# Patient Record
Sex: Female | Born: 1960 | Race: White | Hispanic: No | Smoking: Current some day smoker
Health system: Southern US, Community
[De-identification: ages and names within clinical notes are randomized; demographics above are authoritative.]

## PROBLEM LIST (undated history)

## (undated) DIAGNOSIS — R0602 Shortness of breath: Secondary | ICD-10-CM

## (undated) DIAGNOSIS — E119 Type 2 diabetes mellitus without complications: Secondary | ICD-10-CM

## (undated) DIAGNOSIS — Z720 Tobacco use: Secondary | ICD-10-CM

## (undated) DIAGNOSIS — E039 Hypothyroidism, unspecified: Secondary | ICD-10-CM

## (undated) DIAGNOSIS — L304 Erythema intertrigo: Secondary | ICD-10-CM

## (undated) DIAGNOSIS — Z87442 Personal history of urinary calculi: Secondary | ICD-10-CM

## (undated) DIAGNOSIS — Z91018 Allergy to other foods: Secondary | ICD-10-CM

## (undated) DIAGNOSIS — E1165 Type 2 diabetes mellitus with hyperglycemia: Secondary | ICD-10-CM

## (undated) DIAGNOSIS — L0291 Cutaneous abscess, unspecified: Secondary | ICD-10-CM

## (undated) DIAGNOSIS — I2510 Atherosclerotic heart disease of native coronary artery without angina pectoris: Secondary | ICD-10-CM

## (undated) DIAGNOSIS — K469 Unspecified abdominal hernia without obstruction or gangrene: Secondary | ICD-10-CM

## (undated) DIAGNOSIS — L0390 Cellulitis, unspecified: Secondary | ICD-10-CM

## (undated) DIAGNOSIS — I4510 Unspecified right bundle-branch block: Secondary | ICD-10-CM

## (undated) DIAGNOSIS — F419 Anxiety disorder, unspecified: Secondary | ICD-10-CM

## (undated) DIAGNOSIS — I10 Essential (primary) hypertension: Secondary | ICD-10-CM

## (undated) HISTORY — DX: Type 2 diabetes mellitus without complications: E11.9

## (undated) HISTORY — DX: Allergy to other foods: Z91.018

## (undated) HISTORY — DX: Unspecified abdominal hernia without obstruction or gangrene: K46.9

## (undated) HISTORY — DX: Shortness of breath: R06.02

---

## 2015-03-24 ENCOUNTER — Emergency Department (HOSPITAL_COMMUNITY)
Admission: EM | Admit: 2015-03-24 | Discharge: 2015-03-24 | Disposition: A | Discharge status: Home / Self Care | Payer: Self-pay | Attending: Emergency Medicine | Admitting: Emergency Medicine

## 2015-03-24 ENCOUNTER — Encounter (HOSPITAL_COMMUNITY): Payer: Self-pay | Admitting: Emergency Medicine

## 2015-03-24 DIAGNOSIS — Z720 Tobacco use: Secondary | ICD-10-CM | POA: Insufficient documentation

## 2015-03-24 DIAGNOSIS — K029 Dental caries, unspecified: Secondary | ICD-10-CM | POA: Insufficient documentation

## 2015-03-24 DIAGNOSIS — K047 Periapical abscess without sinus: Secondary | ICD-10-CM | POA: Insufficient documentation

## 2015-03-24 MED ORDER — PENICILLIN V POTASSIUM 250 MG PO TABS
500.0000 mg | ORAL_TABLET | Freq: Once | ORAL | Status: AC
  Administered 2015-03-24: 500 mg via ORAL
  Filled 2015-03-24: qty 2

## 2015-03-24 MED ORDER — TRAMADOL HCL 50 MG PO TABS
50.0000 mg | ORAL_TABLET | Freq: Once | ORAL | Status: DC
  Filled 2015-03-24: qty 1

## 2015-03-24 MED ORDER — PENICILLIN V POTASSIUM 500 MG PO TABS: 500.0000 mg | ORAL_TABLET | Freq: Four times a day (QID) | ORAL | Status: DC

## 2015-03-24 MED ORDER — TRAMADOL HCL 50 MG PO TABS: 50.0000 mg | ORAL_TABLET | Freq: Four times a day (QID) | ORAL | Status: DC | PRN

## 2015-03-24 NOTE — ED Notes (Signed)
Patient states started having dental pain in R lower mouth a few days ago.  Patient states no tooth in the area where she is hurting.  Patient denies drainage.

## 2015-03-24 NOTE — Discharge Instructions (Signed)
Dental Care and Dentist Visits Dental care supports good overall health. Regular dental visits can also help you avoid dental pain, bleeding, infection, and other more serious health problems in the future. It is important to keep the mouth healthy because diseases in the teeth, gums, and other oral tissues can spread to other areas of the body. Some problems, such as diabetes, heart disease, and pre-term labor have been associated with poor oral health.  See your dentist every 6 months. If you experience emergency problems such as a toothache or broken tooth, go to the dentist right away. If you see your dentist regularly, you may catch problems early. It is easier to be treated for problems in the early stages.  WHAT TO EXPECT AT A DENTIST VISIT  Your dentist will look for many common oral health problems and recommend proper treatment. At your regular dental visit, you can expect:  Gentle cleaning of the teeth and gums. This includes scraping and polishing. This helps to remove the sticky substance around the teeth and gums (plaque). Plaque forms in the mouth shortly after eating. Over time, plaque hardens on the teeth as tartar. If tartar is not removed regularly, it can cause problems. Cleaning also helps remove stains.  Periodic X-rays. These pictures of the teeth and supporting bone will help your dentist assess the health of your teeth.  Periodic fluoride treatments. Fluoride is a natural mineral shown to help strengthen teeth. Fluoride treatmentinvolves applying a fluoride gel or varnish to the teeth. It is most commonly done in children.  Examination of the mouth, tongue, jaws, teeth, and gums to look for any oral health problems, such as:  Cavities (dental caries). This is decay on the tooth caused by plaque, sugar, and acid in the mouth. It is best to catch a cavity when it is small.  Inflammation of the gums caused by plaque buildup (gingivitis).  Problems with the mouth or malformed  or misaligned teeth.  Oral cancer or other diseases of the soft tissues or jaws. KEEP YOUR TEETH AND GUMS HEALTHY For healthy teeth and gums, follow these general guidelines as well as your dentist's specific advice:  Have your teeth professionally cleaned at the dentist every 6 months.  Brush twice daily with a fluoride toothpaste.  Floss your teeth daily.  Ask your dentist if you need fluoride supplements, treatments, or fluoride toothpaste.  Eat a healthy diet. Reduce foods and drinks with added sugar.  Avoid smoking. TREATMENT FOR ORAL HEALTH PROBLEMS If you have oral health problems, treatment varies depending on the conditions present in your teeth and gums.  Your caregiver will most likely recommend good oral hygiene at each visit.  For cavities, gingivitis, or other oral health disease, your caregiver will perform a procedure to treat the problem. This is typically done at a separate appointment. Sometimes your caregiver will refer you to another dental specialist for specific tooth problems or for surgery. SEEK IMMEDIATE DENTAL CARE IF:  You have pain, bleeding, or soreness in the gum, tooth, jaw, or mouth area.  A permanent tooth becomes loose or separated from the gum socket.  You experience a blow or injury to the mouth or jaw area. Document Released: 08/29/2011 Document Revised: 03/10/2012 Document Reviewed: 08/29/2011 [facility] Patient Information 2015 [place], [place]. This information is not intended to replace advice given to you by your health care provider. Make sure you discuss any questions you have with your health care provider.  Abscessed Tooth An abscessed tooth is an infection  around your tooth. It may be caused by holes or damage to the tooth (cavity) or a dental disease. An abscessed tooth causes mild to very bad pain in and around the tooth. See your dentist right away if you have tooth or gum pain. HOME CARE  Take your medicine as told. Finish it  even if you start to feel better.  Do not drive after taking pain medicine.  Rinse your mouth (gargle) often with salt water ( teaspoon salt in 8 ounces of warm water).  Do not apply heat to the outside of your face. GET HELP RIGHT AWAY IF:   You have a temperature by mouth above 102 F (38.9 C), not controlled by medicine.  You have chills and a very bad headache.  You have problems breathing or swallowing.  Your mouth will not open.  You develop puffiness (swelling) on the neck or around the eye.  Your pain is not helped by medicine.  Your pain is getting worse instead of better. MAKE SURE YOU:   Understand these instructions.  Will watch your condition.  Will get help right away if you are not doing well or get worse. Document Released: 06/04/2008 Document Revised: 03/10/2012 Document Reviewed: 03/27/2011 [facility] Patient Information 2015 [place], [place]. This information is not intended to replace advice given to you by your health care provider. Make sure you discuss any questions you have with your health care provider.  Emergency Department Resource Guide 1) Find a Doctor and Pay Out of Pocket Although you won't have to find out who is covered by your insurance plan, it is a good idea to ask around and get recommendations. You will then need to call the office and see if the doctor you have chosen will accept you as a new patient and what types of options they offer for patients who are self-pay. Some doctors offer discounts or will set up payment plans for their patients who do not have insurance, but you will need to ask so you aren't surprised when you get to your appointment.  2) Contact Your Local Health Department Not all health departments have doctors that can see patients for sick visits, but many do, so it is worth a call to see if yours does. If you don't know where your local health department is, you can check in your phone book. The CDC also has a tool to  help you locate your state's health department, and many state websites also have listings of all of their local health departments.  3) Find a Walk-in Clinic If your illness is not likely to be very severe or complicated, you may want to try a walk in clinic. These are popping up all over the country in pharmacies, drugstores, and shopping centers. They're usually staffed by nurse practitioners or physician assistants that have been trained to treat common illnesses and complaints. They're usually fairly quick and inexpensive. However, if you have serious medical issues or chronic medical problems, these are probably not your best option.  No Primary Care Doctor: - Call Health Connect at  [phone] - they can help you locate a primary care doctor that  accepts your insurance, provides certain services, etc. - Physician Referral Service- [phone]  Chronic Pain Problems: Organization         Address  Phone   Notes  [facility]  [phone] Patients need to be referred by their primary care doctor.   Medication Assistance: Organization  Address  Phone   Notes  [facility] Medication [facility] [address] [place], [place] [address] [phone]) [phone] --Must be a resident of [facility] [place] -- Must have NO insurance coverage whatsoever (no Medicaid/ Medicare, etc.) -- The pt. MUST have a primary care doctor that directs their care regularly and follows them in the community   MedAssist  [phone]   [employer]  [phone]    Agencies that provide inexpensive medical care: Organization         Address  Phone   Notes  [facility]  [phone]   [facility]    [phone]   [facility] [address] [place], [place] [address] [phone]   [facility] [address], [place] [phone]) [phone]   [facility]anned Parenthood    [phone](336) [phone]   [facility]    [phone]   Community Health and [facility]  [address], [place] Phone:  [phone], Fax:  [phone] Hours of Operation:  9 am - 6 pm, M-F.  Also accepts Medicaid/Medicare and self-pay.  [facility]  [address], [place] Phone: [phone], Fax: [phone]. Hours of Operation:  8:30 am - 5:30 pm, M-F.  Also accepts Medicaid and self-pay.  [facility] [place] [address], [place] Phone: [phone]) [phone]   [facility] [address], [place], [place] [phone], Ext. [phone] Mondays & Thursdays: 7-9 AM.  First 15 patients are seen on a first come, first serve basis.    Medicaid-accepting [facility] Providers:  Organization         Address  Phone   Notes  [facility] [address], [place] [phone] Also accepts self-pay patients.  [facility] [address] [place], [place]  [phone]   [facility] [address], [place] [phone]) [phone]   [facility] [address] [place] [address], [place] [phone]) [phone]   Renaye RakersVeita Bland [address], [place]   [phone]) [phone] Only accepts [place] Access [place] patients after they have their name applied to their card.   Self-Pay (no insurance) in [facility] [place]:  Organization         Address  Phone   Notes  Sickle Cell Patients, [facility] [address], [place] [phone]   [facility] [address], [place] [phone]) [phone]   [facility]  [address], [place] [phone]   [facility]/Dr. Osei-Bonsu  [address] [place] [address], [place] or [phone], [place] [phone] Phone number for both [place] and [place] locations is the same.  [facility] [address],  [place] [phone]) [phone]   [facility] [place] [address] [place] [address], [place] or [address] [phone]   [facility] [address], [place] [phone]) [phone], phone; [phone], fax Sees patients 1st and 3rd Saturday of every month.  Must not qualify for public or private insurance (i.e. Medicaid, Medicare,  Health Choice, Veterans' Benefits)  Household income should be no more than 200% of the poverty level The clinic cannot treat you if you are pregnant or think you  are pregnant  Sexually transmitted diseases are not treated at the clinic.    Dental Care: Organization         Address  Phone  Notes  [facility] of [facility] [place] [phone] Accepts children up to age 85 who are enrolled in [place] or [place]; pregnant women with a Medicaid card; and children who have applied for Medicaid or [place] Health Choice, but were declined, whose parents can pay a reduced fee at time of service.  [facility] Department of [facility]  [address], [place] [phone] Accepts children up to age 31 who are enrolled in [place] or [place]; pregnant women with a Medicaid card; and children who have applied for Medicaid or [place] Health Choice, but were declined, whose parents can pay a reduced fee at time of service.  [place] Adult Dental Access PROGRAM  [place] [phone] Patients are seen by appointment only. Walk-ins are not accepted. [place] will see patients 57 years of age and older. Monday - Tuesday (8am-5pm) Most Wednesdays (8:30-5pm) $30 per visit, cash only  [facility] Adult Dental Access PROGRAM  [address], [facility] [phone] Patients are seen by appointment only. Walk-ins are not accepted. [place] will see patients 70 years of age and older. One Wednesday Evening  (Monthly: Volunteer Based).  $30 per visit, cash only  [place]  [phone] for adults; Children under age 25, call Graduate Pediatric Dentistry at [phone]. Children aged 78-14, please call [phone] to request a pediatric application.  Dental services are provided in all areas of dental care including fillings, crowns and bridges, complete and partial dentures, implants, gum treatment, root canals, and extractions. Preventive care is also provided. Treatment is provided to both adults and children. Patients are selected via a lottery and there is often a waiting list.   [facility] [address], [place]  [phone] [email or web address]   [facility] [address] [place], [place] [phone], Ext. [phone] Second and Fourth Thursday of each month, opens at 6:30 AM; Clinic ends at 9 AM.  Patients are seen on a first-come first-served basis, and a limited number are seen during each clinic.   [facility]  [address] Hillard Danker [place], [place] [phone]   Eligibility Requirements You must have lived in [place], [place], or [place] counties for at least the last three months.   You cannot be eligible for state or federal sponsored [employer], including [employer], [place], or [employer].   You generally cannot be eligible for healthcare insurance through your employer.    How to apply: Eligibility screenings are held every Tuesday and Wednesday afternoon from 1:00 pm until 4:00 pm. You do not need an appointment for the interview!  [facility] [address], [place], [place]   [place]  [place] Department  [place]  [phone]    Behavioral Health Resources in the Community: Intensive Outpatient Programs Organization         Address  Phone  Notes  [place]. [address] [place], [place] [phone]   [facility] Outpatient [address], [place], [place]   ADS: Alcohol & Drug Svcs [address], [place], [place]   [place] [address]  [place], [place] or [phone]   Substance Abuse Resources Organization         Address  Phone  Notes  Alcohol and Drug Services  [phone]   [place]  [phone]   [facility] [place]   Chinita Pester  [phone]   Residential & Outpatient Substance Abuse Program  [phone]   Psychological Services Organization         Address  Phone  Notes  [facility]  [place]  [phone]   [place] [address] [place] or [phone]    Mobile Crisis Teams Organization         Address  Phone  Notes  [facility], Mobile Crisis Care Unit  [phone]   [facility]  [address] [place], [place]   [place] Levels [address] [place] [phone]    Self-Help/Support Groups Organization         Address  Phone             Notes  [place] of [place] - variety of support groups  [place] Call for more information  Narcotics Anonymous (NA), Caring Services [address], [employer] [place]  2 meetings at this location   Special educational needs teacher         Address  Phone  Notes  ASAP Residential Treatment [place],    [place]  [phone]   [facility]  [address], [place] [address], [place], [place]   [place], [place] [phone] Admissions: 8am-3pm M-F  Incentives Substance [place] [address],    [place], [place] [phone]   [facility] [address] [place],  [place], [place]   [facility] [address],  [place], [place]   Insight Programs - Intensive Outpatient [address], [place], [place]   [facility] ([place].,  [place], [place] [phone] or [phone]   [facility] Services (RTS) [address], [place], [place] Accepts Medicaid  [facility] [place] [address]  [place] [phone] Substance Abuse/Addiction Treatment   [facility] Organization         Address  Phone  Notes  [facility]  [phone]   Domenic Schwab, PhD [address] Arlis Porta [place], [place]   [phone] or [phone]   [place], [place] [phone]   [facility] [address] [place], [place] [phone] Insurance/Medicaid/sponsorship through [facility] and Families [address]                                    [place], [place] [phone] [place] [address], [place] [phone]    Dr. Adele Schilder  [phone]   [facility] Dept. 1) [address], [place] 2) [place] 3)  [address], [place] [phone]  [phone]   [place] [phone])  [phone] or [phone] (After Hours)

## 2015-03-24 NOTE — ED Provider Notes (Signed)
CSN: [id number]     Arrival date & time 03/24/15  [id number] History   First MD Initiated Contact with Patient 03/24/15 [phone]     Chief Complaint  Patient presents with  . Dental Pain     (Consider location/radiation/quality/duration/timing/severity/associated sxs/prior Treatment) HPI Comments: Patient is complaining of right lower jaw pain for the past few days.  She states that she had a wisdom tooth that broke off several years ago.  She has not seen a dentist in years.  Denies any fever, difficulty swallowing, difficulty opening her mouth.  Patient is a [age] y.o. female presenting with tooth pain. The history is provided by the patient.  Dental Pain Location:  Lower Lower teeth location:  32/RL 3rd molar, 31/RL 2nd molar and 30/RL 1st molar Quality:  Aching and constant Severity:  Moderate Onset quality:  Gradual Timing:  Constant Progression:  Worsening Context: abscess and dental caries   Relieved by:  Nothing Worsened by:  Jaw movement, pressure, touching, cold food/drink and hot food/drink Ineffective treatments:  None tried Associated symptoms: facial swelling and gum swelling   Associated symptoms: no difficulty swallowing     History reviewed. No pertinent past medical history. History reviewed. No pertinent past surgical history. No family history on file. History  Substance Use Topics  . Smoking status: Current Every Day Smoker -- 1.00 packs/day    Types: Cigarettes  . Smokeless tobacco: Not on file  . Alcohol Use: No   OB History    No data available     Review of Systems  HENT: Positive for dental problem and facial swelling.   All other systems reviewed and are negative.     Allergies  Review of patient's allergies indicates no known allergies.  Home Medications   Prior to Admission medications   Medication Sig Start Date End Date Taking? Authorizing Provider  penicillin v potassium (VEETID) 500 MG tablet Take 1 tablet (500 mg total) by mouth 4 (four)  times daily. 03/24/15   Earley FavorGail Amaria Mundorf, NP  traMADol (ULTRAM) 50 MG tablet Take 1 tablet (50 mg total) by mouth every 6 (six) hours as needed. 03/24/15   Earley FavorGail Juliauna Stueve, NP   BP 137/73 mmHg  Pulse 98  Temp(Src) 97.7 F (36.5 C) (Oral)  Resp 18  Ht 5\' 3"  (1.6 m)  Wt 298 lb (135.172 kg)  BMI 52.80 kg/m2  SpO2 96% Physical Exam  Constitutional: She appears well-developed and well-nourished.  HENT:  Head: Normocephalic.  Patient with extensive dental decay in the lower first, second and third molar with surrounding gum erythema and tenderness  Eyes: Pupils are equal, round, and reactive to light.  Neck: Normal range of motion.  Cardiovascular: Normal rate.   Pulmonary/Chest: Effort normal.  Musculoskeletal: Normal range of motion.  Lymphadenopathy:    She has cervical adenopathy.  Neurological: She is alert.  Nursing note and vitals reviewed.   ED Course  Procedures (including critical care time) Labs Review Labs Reviewed - No data to display  Imaging Review No results found.   EKG Interpretation None     will start patient on penicillin and Ultram.  There is no dentist on call today, will give resource list  MDM   Final diagnoses:  Dental decay  Dental abscess         Earley FavorGail Desmin Daleo, NP 03/24/15 0913  Earley FavorGail [name] Ingman, NP 03/24/15 [id number]  Purvis SheffieldForrest Harrison, MD 03/25/15 1730

## 2015-06-05 ENCOUNTER — Emergency Department (HOSPITAL_COMMUNITY): Payer: Self-pay

## 2015-06-05 ENCOUNTER — Encounter (HOSPITAL_COMMUNITY): Payer: Self-pay

## 2015-06-05 ENCOUNTER — Inpatient Hospital Stay (HOSPITAL_COMMUNITY)
Admission: EM | Admit: 2015-06-05 | Discharge: 2015-06-08 | DRG: 602 | Disposition: A | Discharge status: Home / Self Care | Payer: Self-pay | Attending: Family Medicine | Admitting: Family Medicine

## 2015-06-05 DIAGNOSIS — E872 Acidosis: Secondary | ICD-10-CM | POA: Diagnosis present

## 2015-06-05 DIAGNOSIS — Z6841 Body Mass Index (BMI) 40.0 and over, adult: Secondary | ICD-10-CM

## 2015-06-05 DIAGNOSIS — R471 Dysarthria and anarthria: Secondary | ICD-10-CM

## 2015-06-05 DIAGNOSIS — R739 Hyperglycemia, unspecified: Secondary | ICD-10-CM

## 2015-06-05 DIAGNOSIS — E860 Dehydration: Secondary | ICD-10-CM | POA: Diagnosis present

## 2015-06-05 DIAGNOSIS — F17210 Nicotine dependence, cigarettes, uncomplicated: Secondary | ICD-10-CM | POA: Diagnosis present

## 2015-06-05 DIAGNOSIS — G9340 Encephalopathy, unspecified: Secondary | ICD-10-CM

## 2015-06-05 DIAGNOSIS — L03313 Cellulitis of chest wall: Secondary | ICD-10-CM

## 2015-06-05 DIAGNOSIS — B372 Candidiasis of skin and nail: Secondary | ICD-10-CM | POA: Diagnosis present

## 2015-06-05 DIAGNOSIS — N390 Urinary tract infection, site not specified: Secondary | ICD-10-CM

## 2015-06-05 DIAGNOSIS — G4730 Sleep apnea, unspecified: Secondary | ICD-10-CM | POA: Diagnosis present

## 2015-06-05 DIAGNOSIS — E119 Type 2 diabetes mellitus without complications: Secondary | ICD-10-CM | POA: Diagnosis present

## 2015-06-05 DIAGNOSIS — L02213 Cutaneous abscess of chest wall: Principal | ICD-10-CM

## 2015-06-05 DIAGNOSIS — E1165 Type 2 diabetes mellitus with hyperglycemia: Secondary | ICD-10-CM | POA: Diagnosis present

## 2015-06-05 DIAGNOSIS — G9341 Metabolic encephalopathy: Secondary | ICD-10-CM | POA: Diagnosis present

## 2015-06-05 DIAGNOSIS — N61 Inflammatory disorders of breast: Secondary | ICD-10-CM | POA: Diagnosis present

## 2015-06-05 DIAGNOSIS — Z833 Family history of diabetes mellitus: Secondary | ICD-10-CM

## 2015-06-05 DIAGNOSIS — Z720 Tobacco use: Secondary | ICD-10-CM | POA: Diagnosis present

## 2015-06-05 DIAGNOSIS — E039 Hypothyroidism, unspecified: Secondary | ICD-10-CM | POA: Diagnosis present

## 2015-06-05 HISTORY — DX: Erythema intertrigo: L30.4

## 2015-06-05 HISTORY — DX: Morbid (severe) obesity due to excess calories: E66.01

## 2015-06-05 HISTORY — DX: Type 2 diabetes mellitus with hyperglycemia: E11.65

## 2015-06-05 HISTORY — DX: Cellulitis, unspecified: L03.90

## 2015-06-05 HISTORY — DX: Tobacco use: Z72.0

## 2015-06-05 HISTORY — DX: Cutaneous abscess, unspecified: L02.91

## 2015-06-05 HISTORY — DX: Hypothyroidism, unspecified: E03.9

## 2015-06-05 LAB — BASIC METABOLIC PANEL
Anion gap: 11 (ref 5–15)
Anion gap: 12 (ref 5–15)
BUN: 8 mg/dL (ref 6–20)
BUN: 8 mg/dL (ref 6–20)
CHLORIDE: 93 mmol/L — AB (ref 101–111)
CHLORIDE: 99 mmol/L — AB (ref 101–111)
CO2: 26 mmol/L (ref 22–32)
CO2: 25 mmol/L (ref 22–32)
Calcium: 10 mg/dL (ref 8.9–10.3)
Calcium: 9.5 mg/dL (ref 8.9–10.3)
Creatinine, Ser: 0.93 mg/dL (ref 0.44–1.00)
Creatinine, Ser: 0.87 mg/dL (ref 0.44–1.00)
GFR calc Af Amer: >60 mL/min (ref >60)
GFR calc Af Amer: >60 mL/min (ref >60)
GFR calc non Af Amer: >60 mL/min (ref >60)
GFR calc non Af Amer: >60 mL/min (ref >60)
GLUCOSE: 406 mg/dL — AB (ref 65–99)
Glucose, Bld: 743 mg/dL (ref 65–99)
POTASSIUM: 4.7 mmol/L (ref 3.5–5.1)
Potassium: 3.9 mmol/L (ref 3.5–5.1)
Sodium: 130 mmol/L — ABNORMAL LOW (ref 135–145)
Sodium: 136 mmol/L (ref 135–145)

## 2015-06-05 LAB — URINALYSIS, ROUTINE W REFLEX MICROSCOPIC
BILIRUBIN URINE: NEGATIVE
Glucose, UA: >1000 mg/dL — AB
Ketones, ur: NEGATIVE mg/dL
Nitrite: NEGATIVE
PROTEIN: NEGATIVE mg/dL
Specific Gravity, Urine: <1.005 — ABNORMAL LOW (ref 1.005–1.030)
UROBILINOGEN UA: 0.2 mg/dL (ref 0.0–1.0)
pH: 6 (ref 5.0–8.0)

## 2015-06-05 LAB — URINE MICROSCOPIC-ADD ON

## 2015-06-05 LAB — CBC WITH DIFFERENTIAL/PLATELET
BASOS ABS: 0.1 10*3/uL (ref 0.0–0.1)
BASOS PCT: 0 % (ref 0–1)
EOS PCT: 0 % (ref 0–5)
Eosinophils Absolute: 0.1 10*3/uL (ref 0.0–0.7)
HCT: 44.3 % (ref 36.0–46.0)
HEMOGLOBIN: 14.4 g/dL (ref 12.0–15.0)
Lymphocytes Relative: 8 % — ABNORMAL LOW (ref 12–46)
Lymphs Abs: 1 10*3/uL (ref 0.7–4.0)
MCH: 31.4 pg (ref 26.0–34.0)
MCHC: 32.5 g/dL (ref 30.0–36.0)
MCV: 96.5 fL (ref 78.0–100.0)
MONOS PCT: 6 % (ref 3–12)
Monocytes Absolute: 0.7 10*3/uL (ref 0.1–1.0)
NEUTROS ABS: 10.4 10*3/uL — AB (ref 1.7–7.7)
NEUTROS PCT: 86 % — AB (ref 43–77)
Platelets: 409 10*3/uL — ABNORMAL HIGH (ref 150–400)
RBC: 4.59 MIL/uL (ref 3.87–5.11)
RDW: 14.2 % (ref 11.5–15.5)
WBC: 12.2 10*3/uL — ABNORMAL HIGH (ref 4.0–10.5)

## 2015-06-05 LAB — CBC
HEMATOCRIT: 44.3 % (ref 36.0–46.0)
Hemoglobin: 14.5 g/dL (ref 12.0–15.0)
MCH: 31.4 pg (ref 26.0–34.0)
MCHC: 32.7 g/dL (ref 30.0–36.0)
MCV: 95.9 fL (ref 78.0–100.0)
PLATELETS: 430 10*3/uL — AB (ref 150–400)
RBC: 4.62 MIL/uL (ref 3.87–5.11)
RDW: 14.1 % (ref 11.5–15.5)
WBC: 13.8 10*3/uL — ABNORMAL HIGH (ref 4.0–10.5)

## 2015-06-05 LAB — BLOOD GAS, VENOUS
Acid-Base Excess: 2 mmol/L (ref 0.0–2.0)
BICARBONATE: 26 meq/L — AB (ref 20.0–24.0)
FIO2: 0.28 %
O2 Saturation: 93.8 %
PATIENT TEMPERATURE: 37
PO2 VEN: 64.4 mmHg — AB (ref 30.0–45.0)
TCO2: 22.6 mmol/L (ref 0–100)
pCO2, Ven: 40.5 mmHg — ABNORMAL LOW (ref 45.0–50.0)
pH, Ven: 7.424 — ABNORMAL HIGH (ref 7.250–7.300)

## 2015-06-05 LAB — CBG MONITORING, ED: Glucose-Capillary: 506 mg/dL — ABNORMAL HIGH (ref 65–99)

## 2015-06-05 LAB — GLUCOSE, CAPILLARY: Glucose-Capillary: 370 mg/dL — ABNORMAL HIGH (ref 65–99)

## 2015-06-05 LAB — I-STAT CG4 LACTIC ACID, ED: Lactic Acid, Venous: 2.91 mmol/L (ref 0.5–2.0)

## 2015-06-05 LAB — LACTIC ACID, PLASMA: Lactic Acid, Venous: 3.7 mmol/L (ref 0.5–2.0)

## 2015-06-05 MED ORDER — SODIUM CHLORIDE 0.9 % IV SOLN
INTRAVENOUS | Status: DC
  Filled 2015-06-05: qty 2.5

## 2015-06-05 MED ORDER — HEPARIN SODIUM (PORCINE) 5000 UNIT/ML IJ SOLN
5000.0000 [IU] | Freq: Three times a day (TID) | INTRAMUSCULAR | Status: DC
  Administered 2015-06-06 – 2015-06-08 (×8): 5000 [IU] via SUBCUTANEOUS
  Filled 2015-06-05 (×7): qty 1

## 2015-06-05 MED ORDER — INSULIN ASPART 100 UNIT/ML IV SOLN
6.0000 [IU] | Freq: Once | INTRAVENOUS | Status: AC
  Administered 2015-06-05: 6 [IU] via INTRAVENOUS

## 2015-06-05 MED ORDER — SODIUM CHLORIDE 0.9 % IV SOLN
INTRAVENOUS | Status: DC
  Administered 2015-06-05: 22:00:00 via INTRAVENOUS

## 2015-06-05 MED ORDER — SODIUM CHLORIDE 0.9 % IV BOLUS (SEPSIS)
1000.0000 mL | Freq: Once | INTRAVENOUS | Status: AC
  Administered 2015-06-05: 1000 mL via INTRAVENOUS

## 2015-06-05 MED ORDER — POTASSIUM CHLORIDE 10 MEQ/100ML IV SOLN: 10.0000 meq | INTRAVENOUS | Status: DC

## 2015-06-05 MED ORDER — SODIUM CHLORIDE 0.9 % IV SOLN: INTRAVENOUS | Status: AC

## 2015-06-05 MED ORDER — DEXTROSE-NACL 5-0.45 % IV SOLN: INTRAVENOUS | Status: DC

## 2015-06-05 MED ORDER — SODIUM CHLORIDE 0.9 % IV SOLN: INTRAVENOUS | Status: DC

## 2015-06-05 MED ORDER — SODIUM CHLORIDE 0.9 % IV SOLN: Freq: Once | INTRAVENOUS | Status: AC

## 2015-06-05 MED ORDER — SODIUM CHLORIDE 0.9 % IV SOLN
INTRAVENOUS | Status: DC
  Administered 2015-06-05: 4.5 [IU]/h via INTRAVENOUS
  Administered 2015-06-06: 6.2 [IU]/h via INTRAVENOUS
  Filled 2015-06-05: qty 2.5

## 2015-06-05 MED ORDER — LIDOCAINE HCL (PF) 1 % IJ SOLN
30.0000 mL | Freq: Once | INTRAMUSCULAR | Status: AC
  Administered 2015-06-05: 21:00:00 via INTRADERMAL
  Filled 2015-06-05: qty 30

## 2015-06-05 MED ORDER — LIDOCAINE HCL (PF) 1 % IJ SOLN
INTRAMUSCULAR | Status: AC
  Filled 2015-06-05: qty 5

## 2015-06-05 MED ORDER — CEFTRIAXONE SODIUM 1 G IJ SOLR
1.0000 g | Freq: Once | INTRAMUSCULAR | Status: AC
  Administered 2015-06-05: 1 g via INTRAVENOUS
  Filled 2015-06-05: qty 10

## 2015-06-05 MED ORDER — VANCOMYCIN HCL 10 G IV SOLR
1500.0000 mg | Freq: Once | INTRAVENOUS | Status: AC
  Administered 2015-06-05: 1500 mg via INTRAVENOUS
  Filled 2015-06-05: qty 1500

## 2015-06-05 NOTE — ED Notes (Signed)
Pt's fiance reports pt. Has been confused sporadically since yesterday; pt. Has abscess under right arm, significant amount of drainage at bedside.

## 2015-06-05 NOTE — H&P (Signed)
[facility] History and Physical  Tracy PieriniLydia F Graves YQI:[id number] DOB: 08-Mar-1961 DOA: 06/05/2015  Referring physician: ER PCP: No primary care provider on file.   Chief Complaint: Polydipsia, polyuria. Confusion.  HPI: Tracy PieriniLydia F Graves is a 54 y.o. female  This is a 54 year old lady who gives a one-week history of polyuria and polydipsia. She was found to be somewhat confused today. She denies any fever. She also has a history of skin abscesses. She is a smoker. Evaluation in the emergency room found it to be in severe hyperglycemia and therefore newly diagnosed diabetes. She is not in DKA. She also had skin abscesses, one of which was incised and drained by the ER physician. She is now being admitted for further management.   Review of Systems:  Apart from symptoms above, all systems negative.  Past Medical History  Diagnosis Date  . Hyperglycemia due to type 2 diabetes mellitus 06/05/2015  . Morbid obesity due to excess calories 06/05/2015   History reviewed. No pertinent past surgical history. Social History:  reports that she has been smoking Cigarettes.  She has been smoking about 1.00 pack per day. She does not have any smokeless tobacco history on file. She reports that she does not drink alcohol or use illicit drugs.  No Known Allergies   Family history: Her father and sister also are diabetic.  Prior to Admission medications   Medication Sig Start Date End Date Taking? Authorizing Provider  penicillin v potassium (VEETID) 500 MG tablet Take 1 tablet (500 mg total) by mouth 4 (four) times daily. 03/24/15   Earley FavorGail Schulz, NP  traMADol (ULTRAM) 50 MG tablet Take 1 tablet (50 mg total) by mouth every 6 (six) hours as needed. 03/24/15   Earley FavorGail Schulz, NP   Physical Exam: Filed Vitals:   06/05/15 1822 06/05/15 1914 06/05/15 2027 06/05/15 2102  BP:  135/85 119/81 132/59  Pulse:  100 94 17  Temp:      TempSrc:      Resp:  17 16 17   SpO2: 91% 98% 96% 95%    Wt Readings from Last  3 Encounters:  03/24/15 135.172 kg (298 lb)    General:  Appears calm and comfortable. She does not look toxic or septic. She is clinically dehydrated. She is alert and orientated and currently is not confused or drowsy. Eyes: PERRL, normal lids, irises & conjunctiva ENT: grossly normal hearing, lips & tongue Neck: no LAD, masses or thyromegaly Cardiovascular: RRR, no m/r/g. No LE edema. Telemetry: SR, no arrhythmias  Respiratory: CTA bilaterally, no w/r/r. Normal respiratory effort. Abdomen: soft, ntnd Skin: Several areas of cellulitis noted in the trunk, one on the lateral aspect of the chest has been incised and drained. Musculoskeletal: grossly normal tone BUE/BLE Psychiatric: grossly normal mood and affect, speech fluent and appropriate Neurologic: grossly non-focal.          Labs on Admission:  Basic Metabolic Panel:  Recent Labs Lab 06/05/15 1847  NA 130*  K 4.7  CL 93*  CO2 26  GLUCOSE 743*  BUN 8  CREATININE 0.93  CALCIUM 10.0   Liver Function Tests: No results for input(s): AST, ALT, ALKPHOS, BILITOT, PROT, ALBUMIN in the last 168 hours. No results for input(s): LIPASE, AMYLASE in the last 168 hours. No results for input(s): AMMONIA in the last 168 hours. CBC:  Recent Labs Lab 06/05/15 1847  WBC 12.2*  NEUTROABS 10.4*  HGB 14.4  HCT 44.3  MCV 96.5  PLT 409*   Cardiac Enzymes: No  results for input(s): CKTOTAL, CKMB, CKMBINDEX, TROPONINI in the last 168 hours.  BNP (last 3 results) No results for input(s): BNP in the last 8760 hours.  ProBNP (last 3 results) No results for input(s): PROBNP in the last 8760 hours.  CBG:  Recent Labs Lab 06/05/15 1819 06/05/15 2124  GLUCAP >600* 506*    Radiological Exams on Admission: Dg Chest Portable 1 View  06/05/2015   CLINICAL DATA:  Patient complains of cough, for 1 week. Hyperglycemia.  EXAM: PORTABLE CHEST - 1 VIEW  COMPARISON:  None.  FINDINGS: The heart is enlarged. Marked elevation of the LEFT  hemidiaphragm. Mild vascular congestion. No definite active infiltrates or failure. No osseous findings.  IMPRESSION: Cardiomegaly. Marked elevation LEFT hemidiaphragm. No definite active infiltrates or failure.   Electronically Signed   By: Davonna Belling M.D.   On: 06/05/2015 19:46      Assessment/Plan   1. Newly diagnosed uncontrolled diabetes mellitus. Her presenting blood glucose was 743. She is not in DKA. However, because of such elevated blood glucose, we will treated with intravenous insulin and IV fluids. This should help get her diabetes under control quicker. Her last glucose has been 506. She will need diabetic education. I did discuss principles of nutrition and exercise with her today briefly. 2. Skin abscesses. I will treat her empirically with broad-spectrum antibodies, intravenous vancomycin and intravenous Zosyn. 3. Tobacco abuse. I counseled her to quit smoking. 4. Morbid obesity. I also discussed that she needs to lose weight and briefly discussed nutrition and exercise as indicated above.  Further recommendations will depend on patient's hospital progress.   Code Status: Full code.   DVT Prophylaxis: Heparin.  Family Communication: I discussed the plan with the patient at the bedside.   Disposition Plan: Home when medically stable.   Time spent: One hour.  Wilson Singer [facility] Pager [phone].

## 2015-06-05 NOTE — ED Notes (Signed)
CRITICAL VALUE ALERT  Critical value received:  Lactic acid 3.7 slightly hemolized  Date of notification:  06/05/15  Time of notification:  1930  Critical value read back:Yes.    [facility]  [facility]  MD notified (1st page):  Zavitz  Time of first page:  1930  MD notified (2nd page):  Time of second page:  Responding MD:  Jodi MourningZavitz  Time MD responded:  Jodi MourningZavitz

## 2015-06-05 NOTE — ED Notes (Signed)
Pt here for evaluation of high blood sugar. Also, states she has several abscesses under her right arm that has been there a while

## 2015-06-05 NOTE — ED Notes (Signed)
CRITICAL VALUE ALERT  Critical value received:  Glucose 743  Date of notification:  06/05/15  Time of notification:  1927  Critical value read back:Yes.    Nurse who received alert:  [facility]  MD notified (1st page):  Zavitz  Time of first page:  1927  MD notified (2nd page):  Time of second page:  Responding MD:  Zavits  Time MD responded:  [phone]

## 2015-06-05 NOTE — ED Notes (Signed)
Pt has moments of confusion. She [place] in [place] but not the name of the hospital.

## 2015-06-05 NOTE — ED Provider Notes (Signed)
CSN: [id number]     Arrival date & time 06/05/15  1817 History   First MD Initiated Contact with Patient 06/05/15 1824     Chief Complaint  Patient presents with  . Hyperglycemia     (Consider location/radiation/quality/duration/timing/severity/associated sxs/prior Treatment) HPI Comments: 54 year old female with history of skin abscess, smoker presents with skin infection and confusion. Patient has had worsening redness and swelling to left axillary and right lateral chest region for the past week. Patient's had intermittent worsening confusion since last night. Patient has no known diabetes history however glucose was high. Patient is not on current anti-biotics.  Patient denies respiratory symptoms, focal weakness or fevers.    Patient is a 54 y.o. female presenting with hyperglycemia. The history is provided by the patient.  Hyperglycemia Associated symptoms: confusion and increased thirst   Associated symptoms: no abdominal pain, no chest pain, no dysuria, no fever, no shortness of breath and no vomiting     Past Medical History  Diagnosis Date  . Hyperglycemia due to type 2 diabetes mellitus 06/05/2015   History reviewed. No pertinent past surgical history. No family history on file. History  Substance Use Topics  . Smoking status: Current Every Day Smoker -- 1.00 packs/day    Types: Cigarettes  . Smokeless tobacco: Not on file  . Alcohol Use: No   OB History    No data available     Review of Systems  Constitutional: Negative for fever and chills.  HENT: Negative for congestion.   Eyes: Negative for visual disturbance.  Respiratory: Negative for shortness of breath.   Cardiovascular: Negative for chest pain.  Gastrointestinal: Negative for vomiting and abdominal pain.  Endocrine: Positive for polydipsia and polyphagia.  Genitourinary: Negative for dysuria and flank pain.  Musculoskeletal: Negative for back pain, neck pain and neck stiffness.  Skin: Positive for rash.   Neurological: Negative for light-headedness and headaches.  Psychiatric/Behavioral: Positive for confusion.      Allergies  Review of patient's allergies indicates no known allergies.  Home Medications   Prior to Admission medications   Medication Sig Start Date End Date Taking? Authorizing Provider  penicillin v potassium (VEETID) 500 MG tablet Take 1 tablet (500 mg total) by mouth 4 (four) times daily. 03/24/15   Earley Favor, NP  traMADol (ULTRAM) 50 MG tablet Take 1 tablet (50 mg total) by mouth every 6 (six) hours as needed. 03/24/15   Earley Favor, NP   BP 132/59 mmHg  Pulse 17  Temp(Src) 97.7 F (36.5 C) (Oral)  Resp 17  SpO2 95% Physical Exam  Constitutional: She appears well-developed and well-nourished.  HENT:  Head: Normocephalic and atraumatic.  Dry mucous membranes  Eyes: Right eye exhibits no discharge. Left eye exhibits no discharge.  Neck: Normal range of motion. Neck supple. No tracheal deviation present.  Cardiovascular: Regular rhythm.  Tachycardia present.   Pulmonary/Chest: Effort normal and breath sounds normal.  Abdominal: Soft. She exhibits no distension. There is no tenderness. There is no guarding.  Musculoskeletal: She exhibits no edema.  Neurological: She is alert. GCS eye subscore is 4. GCS verbal subscore is 4. GCS motor subscore is 6.  Patient moves all extremity is equal bilateral, gross sensation intact. Patient does not know the name of the hospital, does not know exactly why she's here. Pupils equal bilateral. Neck supple no meningismus.  Skin: Skin is warm. Rash noted.  Patient has approximate 5 cm area of erythema and mild tenderness inferior and lateral to right breast/chest wall region.  Warmth. Patient has erythema without induration or swelling to left axillary region.  Psychiatric:  Mild confusion  Nursing note and vitals reviewed.   ED Course  Procedures (including critical care time) CRITICAL CARE Performed by: Enid SkeensZAVITZ, Yaniyah Koors  M   Total critical care time: 35 min  Critical care time was exclusive of separately billable procedures and treating other patients.  Critical care was necessary to treat or prevent imminent or life-threatening deterioration.  Critical care was time spent personally by me on the following activities: development of treatment plan with patient and/or surrogate as well as nursing, discussions with consultants, evaluation of patient's response to treatment, examination of patient, obtaining history from patient or surrogate, ordering and performing treatments and interventions, ordering and review of laboratory studies, ordering and review of radiographic studies, pulse oximetry and re-evaluation of patient's condition.  EMERGENCY DEPARTMENT US SOFT TISSUE INTERPRETATION "Study: Limited Soft Tissue Ultrasound"  INDICATIONS: Pain and Soft tissue infection Multiple views of the body part were obtained in real-time with a multi-frequency linear probe PERFORMED BY:  Myself IMAGES ARCHIVED?: Yes SIDE:Right  BODY PART:Chest Wall FINDINGS: Abcess present INTERPRETATION:  Abcess present   CPT: Neck 45409-8176536-26  Upper extremity 76880-26  Axilla 19147-8276880-26  Chest wall 95621-3076604-26  Beast 86578-4676645-26  Upper back 96295-2876604-26  Lower back 41324-4076705-26  Abdominal wall 10272-5376705-26  Pelvic wall 66440-3476857-26  Lower extremity 74259-5676880-26  Other soft tissue 38756-4376999-26  INCISION AND DRAINAGE Performed by: Enid SkeensZAVITZ, Jaidalyn Schillo M Consent: Verbal consent obtained. Risks and benefits: risks, benefits and alternatives were discussed Type: abscess  Body area: right chest wall Anesthesia: local infiltration Incision was made with a scalpel. Local anesthetic: lidocaine Anesthetic total: 5 ml Complexity: none Blunt dissection to break up loculations Drainage: 5 cc purulent and pus  Patient tolerance: Patient tolerated the procedure well with no immediate complications.    Labs Review Labs Reviewed  CBC WITH  DIFFERENTIAL/PLATELET - Abnormal; Notable for the following:    WBC 12.2 (*)    Platelets 409 (*)    Neutrophils Relative % 86 (*)    Neutro Abs 10.4 (*)    Lymphocytes Relative 8 (*)    All other components within normal limits  BASIC METABOLIC PANEL - Abnormal; Notable for the following:    Sodium 130 (*)    Chloride 93 (*)    Glucose, Bld 743 (*)    All other components within normal limits  URINALYSIS, ROUTINE W REFLEX MICROSCOPIC (NOT AT [facility]) - Abnormal; Notable for the following:    APPearance CLOUDY (*)    Specific Gravity, Urine <1.005 (*)    Glucose, UA >1000 (*)    Hgb urine dipstick MODERATE (*)    Leukocytes, UA SMALL (*)    All other components within normal limits  LACTIC ACID, PLASMA - Abnormal; Notable for the following:    Lactic Acid, Venous 3.7 (*)    All other components within normal limits  BLOOD GAS, VENOUS - Abnormal; Notable for the following:    pH, Ven 7.424 (*)    pCO2, Ven 40.5 (*)    pO2, Ven 64.4 (*)    Bicarbonate 26.0 (*)    All other components within normal limits  URINE MICROSCOPIC-ADD ON - Abnormal; Notable for the following:    Squamous Epithelial / LPF FEW (*)    Bacteria, UA MANY (*)    All other components within normal limits  CBG MONITORING, ED - Abnormal; Notable for the following:    Glucose-Capillary >600 (*)    All  other components within normal limits  I-STAT CG4 LACTIC ACID, ED - Abnormal; Notable for the following:    Lactic Acid, Venous 2.91 (*)    All other components within normal limits  CULTURE, BLOOD (ROUTINE X 2)  CULTURE, BLOOD (ROUTINE X 2)  URINE CULTURE  CBG MONITORING, ED    Imaging Review Dg Chest Portable 1 View  06/05/2015   CLINICAL DATA:  Patient complains of cough, for 1 week. Hyperglycemia.  EXAM: PORTABLE CHEST - 1 VIEW  COMPARISON:  None.  FINDINGS: The heart is enlarged. Marked elevation of the LEFT hemidiaphragm. Mild vascular congestion. No definite active infiltrates or failure. No osseous  findings.  IMPRESSION: Cardiomegaly. Marked elevation LEFT hemidiaphragm. No definite active infiltrates or failure.   Electronically Signed   By: Davonna Belling M.D.   On: 06/05/2015 19:46     EKG Interpretation None      MDM   Final diagnoses:  Encephalopathy acute  Cellulitis of chest wall  Hyperglycemia  UTI (lower urinary tract infection)  Abscess of chest wall   Patient presents with new hyperglycemia, confusion and cellulitis versus abscess. Plan for blood work, and biotics, bedside ultrasound look for abscess and admission for infection/encephalopathy.  Urinalysis shows infection, Rocephin ordered. Lactic acid improved with IV fluids. Glucose stabilizer ordered. Plan for stepdown admission.  Insulin drip.  Multiple IV fluid boluses. The patients results and plan were reviewed and discussed.   Any x-rays performed were personally reviewed by myself.   Differential diagnosis were considered with the presenting HPI.  Medications  sodium chloride 0.9 % bolus 1,000 mL (not administered)  vancomycin (VANCOCIN) 15 mg/kg in sodium chloride 0.9 % 100 mL IVPB (not administered)  sodium chloride 0.9 % bolus 1,000 mL (not administered)  insulin aspart (novoLOG) injection 6 Units (not administered)  lidocaine (PF) (XYLOCAINE) 1 % injection 30 mL (not administered)  lidocaine (PF) (XYLOCAINE) 1 % injection (not administered)  cefTRIAXone (ROCEPHIN) 1 g in dextrose 5 % 50 mL IVPB (not administered)  insulin regular (NOVOLIN R,HUMULIN R) 250 Units in sodium chloride 0.9 % 250 mL (1 Units/mL) infusion (not administered)  0.9 %  sodium chloride infusion (not administered)  0.9 %  sodium chloride infusion (not administered)    Filed Vitals:   06/05/15 1822 06/05/15 1914 06/05/15 2027 06/05/15 2102  BP:  135/85 119/81 132/59  Pulse:  100 94 17  Temp:      TempSrc:      Resp:  SpO2: 91% 98% 96% 95%    Final diagnoses:  Encephalopathy acute  Cellulitis of chest wall   Hyperglycemia  UTI (lower urinary tract infection)  Abscess of chest wall    Admission/ observation were discussed with the admitting physician, patient and/or family and they are comfortable with the plan.     Blane Ohara, MD 06/05/15 2111

## 2015-06-05 NOTE — ED Notes (Signed)
I&D completed by EDP, pt tolerated well.

## 2015-06-06 ENCOUNTER — Inpatient Hospital Stay (HOSPITAL_COMMUNITY): Payer: Self-pay

## 2015-06-06 DIAGNOSIS — Z720 Tobacco use: Secondary | ICD-10-CM

## 2015-06-06 DIAGNOSIS — L304 Erythema intertrigo: Secondary | ICD-10-CM

## 2015-06-06 LAB — BASIC METABOLIC PANEL
ANION GAP: 10 (ref 5–15)
Anion gap: 10 (ref 5–15)
BUN: 7 mg/dL (ref 6–20)
BUN: 8 mg/dL (ref 6–20)
CO2: 25 mmol/L (ref 22–32)
CO2: 25 mmol/L (ref 22–32)
CREATININE: 0.71 mg/dL (ref 0.44–1.00)
Calcium: 9.3 mg/dL (ref 8.9–10.3)
Calcium: 9.5 mg/dL (ref 8.9–10.3)
Chloride: 101 mmol/L (ref 101–111)
Chloride: 101 mmol/L (ref 101–111)
Creatinine, Ser: 0.76 mg/dL (ref 0.44–1.00)
GFR calc Af Amer: >60 mL/min (ref >60)
GFR calc non Af Amer: >60 mL/min (ref >60)
Glucose, Bld: 311 mg/dL — ABNORMAL HIGH (ref 65–99)
Glucose, Bld: 276 mg/dL — ABNORMAL HIGH (ref 65–99)
POTASSIUM: 3.8 mmol/L (ref 3.5–5.1)
POTASSIUM: 4.4 mmol/L (ref 3.5–5.1)
Sodium: 136 mmol/L (ref 135–145)
Sodium: 136 mmol/L (ref 135–145)

## 2015-06-06 LAB — GLUCOSE, CAPILLARY
GLUCOSE-CAPILLARY: 358 mg/dL — AB (ref 65–99)
GLUCOSE-CAPILLARY: 355 mg/dL — AB (ref 65–99)
GLUCOSE-CAPILLARY: 274 mg/dL — AB (ref 65–99)
Glucose-Capillary: 340 mg/dL — ABNORMAL HIGH (ref 65–99)
Glucose-Capillary: 230 mg/dL — ABNORMAL HIGH (ref 65–99)
Glucose-Capillary: 232 mg/dL — ABNORMAL HIGH (ref 65–99)
Glucose-Capillary: 280 mg/dL — ABNORMAL HIGH (ref 65–99)
Glucose-Capillary: 448 mg/dL — ABNORMAL HIGH (ref 65–99)
Glucose-Capillary: 398 mg/dL — ABNORMAL HIGH (ref 65–99)

## 2015-06-06 LAB — TSH: TSH: 38.102 u[IU]/mL — ABNORMAL HIGH (ref 0.350–4.500)

## 2015-06-06 LAB — MRSA PCR SCREENING: MRSA BY PCR: NEGATIVE

## 2015-06-06 MED ORDER — FLUCONAZOLE 100MG IVPB
100.0000 mg | INTRAVENOUS | Status: DC
  Administered 2015-06-06 – 2015-06-07 (×2): 100 mg via INTRAVENOUS
  Filled 2015-06-06 (×4): qty 50

## 2015-06-06 MED ORDER — PIPERACILLIN-TAZOBACTAM 3.375 G IVPB
3.3750 g | Freq: Three times a day (TID) | INTRAVENOUS | Status: DC
  Administered 2015-06-06 – 2015-06-08 (×6): 3.375 g via INTRAVENOUS
  Filled 2015-06-06 (×10): qty 50

## 2015-06-06 MED ORDER — INSULIN ASPART PROT & ASPART (70-30 MIX) 100 UNIT/ML ~~LOC~~ SUSP
25.0000 [IU] | Freq: Two times a day (BID) | SUBCUTANEOUS | Status: DC
  Administered 2015-06-06 – 2015-06-07 (×2): 25 [IU] via SUBCUTANEOUS
  Filled 2015-06-06: qty 10

## 2015-06-06 MED ORDER — LIVING WELL WITH DIABETES BOOK
Freq: Once | Status: AC
  Administered 2015-06-06: 08:00:00
  Filled 2015-06-06: qty 1

## 2015-06-06 MED ORDER — INSULIN ASPART 100 UNIT/ML ~~LOC~~ SOLN
0.0000 [IU] | Freq: Every day | SUBCUTANEOUS | Status: DC
  Administered 2015-06-06 – 2015-06-07 (×2): 3 [IU] via SUBCUTANEOUS

## 2015-06-06 MED ORDER — VANCOMYCIN HCL 10 G IV SOLR
1500.0000 mg | Freq: Two times a day (BID) | INTRAVENOUS | Status: DC
  Administered 2015-06-06 – 2015-06-07 (×4): 1500 mg via INTRAVENOUS
  Filled 2015-06-06: qty 1000
  Filled 2015-06-06 (×6): qty 1500

## 2015-06-06 MED ORDER — SODIUM CHLORIDE 0.9 % IV SOLN
INTRAVENOUS | Status: DC
  Administered 2015-06-06 – 2015-06-07 (×3): via INTRAVENOUS

## 2015-06-06 MED ORDER — INSULIN STARTER KIT- SYRINGES (ENGLISH)
1.0000 | Freq: Once | Status: AC
  Administered 2015-06-06: 1
  Filled 2015-06-06: qty 1

## 2015-06-06 MED ORDER — INSULIN GLARGINE 100 UNIT/ML ~~LOC~~ SOLN
15.0000 [IU] | Freq: Once | SUBCUTANEOUS | Status: AC
  Administered 2015-06-06: 15 [IU] via SUBCUTANEOUS
  Filled 2015-06-06: qty 0.15

## 2015-06-06 MED ORDER — INSULIN ASPART 100 UNIT/ML ~~LOC~~ SOLN
0.0000 [IU] | Freq: Three times a day (TID) | SUBCUTANEOUS | Status: DC
Start: 2015-06-06 — End: 2015-06-08
  Administered 2015-06-06 (×2): 15 [IU] via SUBCUTANEOUS
  Administered 2015-06-07 (×2): 8 [IU] via SUBCUTANEOUS
  Administered 2015-06-07 – 2015-06-08 (×3): 5 [IU] via SUBCUTANEOUS

## 2015-06-06 MED ORDER — NICOTINE 21 MG/24HR TD PT24
21.0000 mg | MEDICATED_PATCH | Freq: Every day | TRANSDERMAL | Status: DC
  Administered 2015-06-06 – 2015-06-08 (×4): 21 mg via TRANSDERMAL
  Filled 2015-06-06 (×4): qty 1

## 2015-06-06 MED ORDER — INSULIN ASPART 100 UNIT/ML ~~LOC~~ SOLN
0.0000 [IU] | Freq: Three times a day (TID) | SUBCUTANEOUS | Status: DC
  Administered 2015-06-06: 5 [IU] via SUBCUTANEOUS

## 2015-06-06 MED ORDER — INSULIN ASPART PROT & ASPART (70-30 MIX) 100 UNIT/ML ~~LOC~~ SUSP: 25.0000 [IU] | Freq: Two times a day (BID) | SUBCUTANEOUS | Status: DC

## 2015-06-06 MED ORDER — NYSTATIN 100000 UNIT/GM EX POWD
Freq: Three times a day (TID) | CUTANEOUS | Status: DC
  Administered 2015-06-06 – 2015-06-08 (×7): via TOPICAL
  Filled 2015-06-06 (×2): qty 15

## 2015-06-06 NOTE — Plan of Care (Signed)
Problem: Food- and Nutrition-Related Knowledge Deficit (NB-1.1) Goal: Nutrition education Formal process to instruct or train a patient/client in a skill or to impart knowledge to help patients/clients voluntarily manage or modify food choices and eating behavior to maintain or improve health. Outcome: Adequate for Discharge Received consult for diet education for new diagnosis of DM 2.  Pt in the room with fiancee and daughter at bedside. Provided pt with handout "Carbohydrate Counting For People With Diabetes" by the Academy of [employer]etics. Diet recall shows somewhat varied diet, and high intakes of soda. Encouraged pt to switch to flavored unsweetened waters which she admits to liking and drink soda on special occasions only. Pt states she likes salads, discussed choosing dressings that are lower in added sugars. Discussed importance of consuming 3 meals and 2-3 snacks to minimize glucose spikes, as well as checking blood sugars regularly. Walked pt through the handout explaining what carbs are, serving sizes, used foods on a breakfast tray to show how to read nutrition labels. Used MyPlate method to emphasize healthy eating pattern. Pt's questions were answered, teach back method used, pt verbalized understanding, expect good compliance.  Quindell Shere A. Malvika Tung Dietetic Intern Pager: [phone] 06/06/2015 11:39 AM

## 2015-06-06 NOTE — Progress Notes (Signed)
Pt a/o.vss.IV patent. No complaints of any distress.  Report given to L.Basilia Jumboovington, Charity fundraiserN. Pt to be transferred to room 324 via wheelchair.

## 2015-06-06 NOTE — Progress Notes (Signed)
ANTIBIOTIC CONSULT NOTE - FOLLOW UP  Pharmacy Consult for Vancomycin and Zosyn  Indication: cellulitis   No Known Allergies  Patient Measurements: Height: 5\' 3"  (160 cm) Weight: (!) 314 lb 9.5 oz (142.7 kg) IBW/kg (Calculated) : 52.4  Vital Signs: Temp: 97.5 F (36.4 C) (06/06 0856) Temp Source: Oral (06/06 0856) BP: 126/59 mmHg (06/06 1000) Pulse Rate: 88 (06/06 1000) Intake/Output from previous day: 06/05 0701 - 06/06 0700 In: 54 [I.V.:54] Out: 200 [Urine:200] Intake/Output from this shift:    Labs:  Recent Labs  06/05/15 1847 06/05/15 2238 06/06/15 0211 06/06/15 0551  WBC 12.2* 13.8*  --   --   HGB 14.4 14.5  --   --   PLT 409* 430*  --   --   CREATININE 0.93 0.87 0.76 0.71   Estimated Creatinine Clearance: 113.6 mL/min (by C-G formula based on Cr of 0.71). No results for input(s): VANCOTROUGH, VANCOPEAK, VANCORANDOM, GENTTROUGH, GENTPEAK, GENTRANDOM, TOBRATROUGH, TOBRAPEAK, TOBRARND, AMIKACINPEAK, AMIKACINTROU, AMIKACIN in the last 72 hours.   Microbiology: Recent Results (from the past 720 hour(s))  Culture, blood (routine x 2)     Status: None (Preliminary result)   Collection Time: 06/05/15  6:47 PM  Result Value Ref Range Status   Specimen Description RIGHT ANTECUBITAL  Final   Special Requests BOTTLES DRAWN AEROBIC AND ANAEROBIC 6CC  Final   Culture NO GROWTH 1 DAY  Final   Report Status PENDING  Incomplete  Culture, blood (routine x 2)     Status: None (Preliminary result)   Collection Time: 06/05/15  7:48 PM  Result Value Ref Range Status   Specimen Description BLOOD RIGHT HAND  Final   Special Requests BOTTLES DRAWN AEROBIC AND ANAEROBIC 6CC  Final   Culture NO GROWTH 1 DAY  Final   Report Status PENDING  Incomplete  MRSA PCR Screening     Status: None   Collection Time: 06/05/15 11:50 PM  Result Value Ref Range Status   MRSA by PCR NEGATIVE NEGATIVE Final    Comment:        The GeneXpert MRSA Assay (FDA approved for NASAL specimens only),  is one component of a comprehensive MRSA colonization surveillance program. It is not intended to diagnose MRSA infection nor to guide or monitor treatment for MRSA infections.     Anti-infectives    Start     Dose/Rate Route Frequency Ordered Stop   06/06/15 1100  piperacillin-tazobactam (ZOSYN) IVPB 3.375 g     3.375 g 12.5 mL/hr over 240 Minutes Intravenous Every 8 hours 06/06/15 1037     06/06/15 1045  vancomycin (VANCOCIN) 1,500 mg in sodium chloride 0.9 % 500 mL IVPB     1,500 mg 250 mL/hr over 120 Minutes Intravenous Every 12 hours 06/06/15 1037     06/05/15 2100  cefTRIAXone (ROCEPHIN) 1 g in dextrose 5 % 50 mL IVPB     1 g 100 mL/hr over 30 Minutes Intravenous  Once 06/05/15 2045 06/05/15 2146   06/05/15 1900  vancomycin (VANCOCIN) 1,500 mg in sodium chloride 0.9 % 500 mL IVPB     1,500 mg 250 mL/hr over 120 Minutes Intravenous  Once 06/05/15 1852 06/06/15 0010     Assessment: Okay for Protocol, Obesity/Normalized CrCl Vancomycin dosing protocol will be initiated with an estimated normalized CrCl = 104 ml/min.  Received initial dose of Rocephin and IV Vancomycin in the ED last night.   Goal of Therapy:  Vancomycin trough level 10-15 mcg/ml  Plan:  Zosyn 3.375gm IV every  8 hours. Follow-up micro data, labs, vitals.  Vancomycin  IV every 12 hours. Measure antibiotic drug levels at steady state Follow up culture results  Mady Gemma 06/06/2015,10:37 AM

## 2015-06-06 NOTE — Progress Notes (Signed)
Notified the Diabetes coordinator that the education channel is not working and the patient is unable to watch the Diabetes education videos at this time. Patient has received the insulin kit and diabetes booklet this am.  

## 2015-06-06 NOTE — Progress Notes (Addendum)
Inpatient Diabetes Program Recommendations  AACE/ADA: New Consensus Statement on Inpatient Glycemic Control (2013)  Target Ranges:  Prepandial:   less than 140 mg/dL      Peak postprandial:   less than 180 mg/dL (1-2 hours)      Critically ill patients:  140 - 180 mg/dL   Results for Tracy Graves, Tracy Graves (MRN [id number]) as of 06/06/2015 07:29  Ref. Range 06/05/2015 18:47 06/05/2015 22:38 06/06/2015 02:11 06/06/2015 05:51  Glucose Latest Ref Range: 65-99 mg/dL 743 (HH) 406 (H) 311 (H) 276 (H)    Reason for Visit: Newly dx DM  Current orders for Inpatient glycemic control: Novolog 0-9 units TID with meals  Inpatient Diabetes Program Recommendations Insulin - Basal: Noted one time order of Lantus 15 units was given at 4:39 am 06/06/15 when patient was transitioned off IV insulin. Since patient has no insurance recommend using 70/30 insulin. Recommend starting with 70/30 20 units BID to start with supper today. Correction (SSI): Please consider increasing Novolog to moderate correction scale and adding Novolog bedtime correction. HgbA1C: A1C in process.  Note: Initial glucose of 743 mg/dl and patient was on an insulin drip and has now transitioned to SQ insulin. Noted consult for new DM dx. Have ordered Living Well with Diabetes book, insulin starter kit (syringes), patient education videos on diabetes, RD consult, CM consult, and nursing care order to provide diabetes education with patient at bedside with each interaction with the patient. Noted patient has no insurance and will therefore need the most affordable insulins. Recommend using Novolin 70/30 as an outpatient. Will plan to see patient this afternoon or first thing in the morning.  06/05/14_0 :35-Spoke with patient about new diabetes diagnosis. Patient reports that her father and 2 of her siblings have diabetes. Patient reports that she has given insulin injections to her father in the past but it was a long time ago.  Discussed basic pathophysiology of  DM Type 2, basic home care, importance of checking CBGs and maintaining good CBG control to prevent long-term and short-term complications. Discussed what an A1C is and explained that an A1C has been drawn and results are pending. Reviewed glucose and A1C goals. Reviewed signs and symptoms of hyperglycemia and hypoglycemia along with treatment for both. Discussed impact of nutrition, exercise, stress, sickness, and medications on diabetes control. Discussed carbohydrates, carbohydrate goals per day and meal, along with portion sizes. Discussed Lantus, Novolog, and 70/30 insulin and explained that since she has no insurance we are recommending to use Novolin 70/30 as an outpatient for glycemic control. Educated patient on insulin use with vial and syringe.  Reviewed contents of insulin flexpen starter kit. Reviewed and demonstrated all steps of insulin injection with vial and syringe.  Patient able to provide successful return demonstration.  Informed patient that she could purchase Novolin 70/30 from [facility] for $25 per vial.  Patient verbalized understanding of information discussed and she states that she has no further questions at this time related to diabetes.  RNs to provide ongoing basic DM education at bedside with this patient and engage patient to actively check blood glucose and administer insulin injections.   Thanks, Barnie Alderman, RN, MSN, CCRN, CDE Diabetes Coordinator Inpatient Diabetes Program [phone] (Team Pager from [place] to [place]) [phone] ([place] office) [phone] [facility] office) [phone] [facility] office)

## 2015-06-06 NOTE — Care Management Note (Signed)
Case Management Note  Patient Details  Name: Tracy PieriniLydia F Broussard MRN: [id number] Date of Birth: December 27, 1961   Expected Discharge Date:  06/08/15               Expected Discharge Plan:  Home/Self Care  In-House Referral:  Financial Counselor  Discharge planning Services  CM Consult, MATCH Program, Indigent Health Clinic  Post Acute Care Choice:  NA Choice offered to:  NA  DME Arranged:    DME Agency:     HH Arranged:    HH Agency:     Status of Service:  In process, will continue to follow  Medicare Important Message Given:    Date Medicare IM Given:    Medicare IM give by:    Date Additional Medicare IM Given:    Additional Medicare Important Message give by:     If discussed at Long Length of Stay Meetings, dates discussed:    Additional Comments: Pt is from home and independent at baseline. Pt has no HH services or DME's prior to admission. Pt is unemployed and uninsured. Pt lives with her fiance and friend. Pt has no PCP and goes to the ED for medical care. Pt admitted for new onset DM. Pt has seen diabetes coordinator and nutritionist. Pt plans to return home at discharge with self care. Pt has been referred to financial counselor. Pt says she has a family member that has donated a glucometer to her and she can afford the strips. Pt verbalizes understanding of the cost of her insulin. CM discussed options of follow up care; [facility] Discussed the pro's/con's and requirements of each care provider. Pt would like to discuss options with her daughter. Pt states she is overwhelmed with all of the information she is getting. Pt anxious to get home and "get started on her exercise and eating program." Pt will also be referred to the congregational nurse program. Pt will need appointment made for follow up care at provider of her choice. Pt may need MATCH voucher, depending on medications ordered at discharge. Patient transferring to  dept 300. CM will cont to follow for discharge planning/needs.    Malcolm Metrohildress, Orval Dortch Demske, RN 06/06/2015, 12:51 PMexcersize

## 2015-06-06 NOTE — Progress Notes (Signed)
[facility] PROGRESS NOTE  Tracy Graves ZOX:[id number] DOB: 1961/08/26 DOA: 06/05/2015 PCP: No primary care provider on file.  Assessment/Plan: 1. New-onset diabetes, likely type II. No evidence of DKA. Anion gap is 10. Blood sugars have improved with intravenous insulin and IV fluids. She's been transitioned to 70/30 insulin. Continue to monitor blood sugar. Appreciate diabetes coordinator input. 2. Severe intertrigo. Likely has underlying candidal intertrigo with superimposed bacterial cellulitis. Patient is on broad-spectrum antibiotic. Will also add nystatin powder and intravenous fluconazole. 3. Dysarthria. Patient still has difficulty with her speech. Although this may be related to hyperglycemia and metabolic imbalance, will check MRI of brain to rule out any other underlying pathology. 4. Tobacco abuse. Counseled on the importance of tobacco cessation. 5. Morbid obesity 6. Probable sleep apnea. Will need outpatient sleep study.  Code Status: full code Family Communication: discussed with patient and daughter at the bedside Disposition Plan: discharge home once improved   Consultants:    Procedures:    Antibiotics:  Vancomycin 6/5>>  Zosyn 6/5>>  HPI/Subjective: Patient is feeling better. Family feels that dysarthria is improving. It is still difficult at times to completely comprehend her speech.  Objective: Filed Vitals:   06/06/15 1000  BP: 126/59  Pulse: 88  Temp:   Resp: 25    Intake/Output Summary (Last 24 hours) at 06/06/15 1050 Last data filed at 06/06/15 0357  Gross per 24 hour  Intake     54 ml  Output    200 ml  Net   -146 ml   Filed Weights   06/05/15 2333  Weight: 142.7 kg (314 lb 9.5 oz)    Exam:   General:  NAD  Cardiovascular: S1, S2 RRR  Respiratory: cta b  Abdomen: soft, nt, nd, bs+  Musculoskeletal: no edema b/l   Data Reviewed: Basic Metabolic Panel:  Recent Labs Lab 06/05/15 1847 06/05/15 2238 06/06/15 0211  06/06/15 0551  NA 130* 136 136 136  K 4.7 3.9 3.8 4.4  CL 93* 99* 101 101  CO2 GLUCOSE 743* 406* 311* 276*  BUN CREATININE 0.93 0.87 0.76 0.71  CALCIUM 10.0 9.5 9.3 9.5   Liver Function Tests: No results for input(s): AST, ALT, ALKPHOS, BILITOT, PROT, ALBUMIN in the last 168 hours. No results for input(s): LIPASE, AMYLASE in the last 168 hours. No results for input(s): AMMONIA in the last 168 hours. CBC:  Recent Labs Lab 06/05/15 1847 06/05/15 2238  WBC 12.2* 13.8*  NEUTROABS 10.4*  --   HGB 14.4 14.5  HCT 44.3 44.3  MCV 96.5 95.9  PLT 409* 430*   Cardiac Enzymes: No results for input(s): CKTOTAL, CKMB, CKMBINDEX, TROPONINI in the last 168 hours. BNP (last 3 results) No results for input(s): BNP in the last 8760 hours.  ProBNP (last 3 results) No results for input(s): PROBNP in the last 8760 hours.  CBG:  Recent Labs Lab 06/05/15 2342 06/06/15 0131 06/06/15 0334 06/06/15 0436 06/06/15 0719  GLUCAP 370* 340* 230* 232* 280*    Recent Results (from the past 240 hour(s))  Culture, blood (routine x 2)     Status: None (Preliminary result)   Collection Time: 06/05/15  6:47 PM  Result Value Ref Range Status   Specimen Description RIGHT ANTECUBITAL  Final   Special Requests BOTTLES DRAWN AEROBIC AND ANAEROBIC 6CC  Final   Culture NO GROWTH 1 DAY  Final   Report Status PENDING  Incomplete  Culture, blood (routine x  2)     Status: None (Preliminary result)   Collection Time: 06/05/15  7:48 PM  Result Value Ref Range Status   Specimen Description BLOOD RIGHT HAND  Final   Special Requests BOTTLES DRAWN AEROBIC AND ANAEROBIC 6CC  Final   Culture NO GROWTH 1 DAY  Final   Report Status PENDING  Incomplete  MRSA PCR Screening     Status: None   Collection Time: 06/05/15 11:50 PM  Result Value Ref Range Status   MRSA by PCR NEGATIVE NEGATIVE Final    Comment:        The GeneXpert MRSA Assay (FDA approved for NASAL specimens only), is one  component of a comprehensive MRSA colonization surveillance program. It is not intended to diagnose MRSA infection nor to guide or monitor treatment for MRSA infections.      Studies: Dg Chest Portable 1 View  06/05/2015   CLINICAL DATA:  Patient complains of cough, for 1 week. Hyperglycemia.  EXAM: PORTABLE CHEST - 1 VIEW  COMPARISON:  None.  FINDINGS: The heart is enlarged. Marked elevation of the LEFT hemidiaphragm. Mild vascular congestion. No definite active infiltrates or failure. No osseous findings.  IMPRESSION: Cardiomegaly. Marked elevation LEFT hemidiaphragm. No definite active infiltrates or failure.   Electronically Signed   By: Davonna BellingJohn  Curnes M.D.   On: 06/05/2015 19:46    Scheduled Meds: . fluconazole (DIFLUCAN) IV  100 mg Intravenous Q24H  . heparin  5,000 Units Subcutaneous 3 times per day  . insulin aspart  0-15 Units Subcutaneous TID WC  . insulin aspart  0-5 Units Subcutaneous QHS  . insulin aspart protamine- aspart  25 Units Subcutaneous BID WC  . nicotine  21 mg Transdermal Daily  . nystatin   Topical TID  . piperacillin-tazobactam (ZOSYN)  IV  3.375 g Intravenous Q8H  . vancomycin  1,500 mg Intravenous Q12H   Continuous Infusions: . sodium chloride      Active Problems:   Hyperglycemia due to type 2 diabetes mellitus   Skin abscess   Morbid obesity due to excess calories   Tobacco abuse   Uncontrolled diabetes mellitus    Time spent: 40mins    [facility],Marsden Zaino  [facility] Pager [phone]. If 7PM-7AM, please contact night-coverage at [email or web address], password [email or web address] 06/06/2015, 10:50 AM  LOS: 1 day

## 2015-06-07 DIAGNOSIS — R471 Dysarthria and anarthria: Secondary | ICD-10-CM

## 2015-06-07 DIAGNOSIS — E039 Hypothyroidism, unspecified: Secondary | ICD-10-CM | POA: Diagnosis present

## 2015-06-07 DIAGNOSIS — L02213 Cutaneous abscess of chest wall: Secondary | ICD-10-CM | POA: Diagnosis present

## 2015-06-07 LAB — GLUCOSE, CAPILLARY
GLUCOSE-CAPILLARY: 268 mg/dL — AB (ref 65–99)
GLUCOSE-CAPILLARY: 278 mg/dL — AB (ref 65–99)
Glucose-Capillary: 255 mg/dL — ABNORMAL HIGH (ref 65–99)
Glucose-Capillary: 244 mg/dL — ABNORMAL HIGH (ref 65–99)

## 2015-06-07 LAB — CBC
HEMATOCRIT: 42.1 % (ref 36.0–46.0)
Hemoglobin: 13.5 g/dL (ref 12.0–15.0)
MCH: 31.2 pg (ref 26.0–34.0)
MCHC: 32.1 g/dL (ref 30.0–36.0)
MCV: 97.2 fL (ref 78.0–100.0)
PLATELETS: 382 10*3/uL (ref 150–400)
RBC: 4.33 MIL/uL (ref 3.87–5.11)
RDW: 14.2 % (ref 11.5–15.5)
WBC: 9.5 10*3/uL (ref 4.0–10.5)

## 2015-06-07 LAB — BASIC METABOLIC PANEL
ANION GAP: 7 (ref 5–15)
BUN: 11 mg/dL (ref 6–20)
CALCIUM: 9 mg/dL (ref 8.9–10.3)
CO2: 28 mmol/L (ref 22–32)
Chloride: 99 mmol/L — ABNORMAL LOW (ref 101–111)
Creatinine, Ser: 0.75 mg/dL (ref 0.44–1.00)
GFR calc Af Amer: >60 mL/min (ref >60)
GLUCOSE: 290 mg/dL — AB (ref 65–99)
POTASSIUM: 3.9 mmol/L (ref 3.5–5.1)
Sodium: 134 mmol/L — ABNORMAL LOW (ref 135–145)

## 2015-06-07 LAB — URINE CULTURE: Colony Count: >=100000

## 2015-06-07 LAB — HEMOGLOBIN A1C
Hgb A1c MFr Bld: 11.2 % — ABNORMAL HIGH (ref 4.8–5.6)
Mean Plasma Glucose: 275 mg/dL

## 2015-06-07 LAB — T4, FREE: FREE T4: 0.53 ng/dL — AB (ref 0.61–1.12)

## 2015-06-07 MED ORDER — INSULIN ASPART PROT & ASPART (70-30 MIX) 100 UNIT/ML ~~LOC~~ SUSP
30.0000 [IU] | Freq: Two times a day (BID) | SUBCUTANEOUS | Status: DC
  Administered 2015-06-07 – 2015-06-08 (×2): 30 [IU] via SUBCUTANEOUS
  Filled 2015-06-07: qty 10

## 2015-06-07 MED ORDER — LEVOTHYROXINE SODIUM 75 MCG PO TABS
75.0000 ug | ORAL_TABLET | Freq: Every day | ORAL | Status: DC
  Administered 2015-06-08: 75 ug via ORAL
  Filled 2015-06-07: qty 1

## 2015-06-07 NOTE — Progress Notes (Signed)
Inpatient Diabetes Program Recommendations  AACE/ADA: New Consensus Statement on Inpatient Glycemic Control (2013)  Target Ranges:  Prepandial:   less than 140 mg/dL      Peak postprandial:   less than 180 mg/dL (1-2 hours)      Critically ill patients:  140 - 180 mg/dL   Results for Donn PieriniFRENCH, Natlie F (MRN [id number]) as of 06/07/2015 07:33  Ref. Range 06/06/2015 07:19 06/06/2015 11:15 06/06/2015 11:40 06/06/2015 16:11 06/06/2015 20:45  Glucose-Capillary Latest Ref Range: 65-99 mg/dL 409280 (H) 811448 (H) 914398 (H) 355 (H) 274 (H)    Diabetes history: Newly dx DM  Outpatient Diabetes medications: NA Current orders for Inpatient glycemic control: 70/30 25 units BID, Novolog 0-15 units TID with meals, Novolog 0-5 units HS  Inpatient Diabetes Program Recommendations Insulin - Basal: Please consider increasing 70/30 to 30 units BID (will provide 42 units for basal and 18 units for meal coverage).  Thanks, Orlando PennerMarie Marabella Popiel, RN, MSN, CCRN, CDE Diabetes Coordinator Inpatient Diabetes Program [phone] (Team Pager from 8am to 5pm) [phone] ([place] office) [phone] [facility] office) [phone] [facility] office)

## 2015-06-07 NOTE — Progress Notes (Signed)
Patient demonstrated ability to give own insuline.

## 2015-06-07 NOTE — Progress Notes (Signed)
[facility] PROGRESS NOTE  Tracy BLOODSWORTH RUE:[id number] DOB: 12-Aug-1961 DOA: 06/05/2015 PCP: No primary care provider on file.  Assessment/Plan: 1. New-onset diabetes, likely type II. No evidence of DKA. Anion gap is 7. CBG range 255-268. 70/30 insulin increased to 30 units. Continue to monitor blood sugar.  2. Severe intertrigo. Likely has underlying candidal intertrigo with superimposed bacterial cellulitis. Skin all skin folds, axilla, perineum involved. Continue broad-spectrum antibiotic day #3. Continue nystatin powder and intravenous fluconazole. 3. Dysarthria. Resolved today. May have been related to hyperglycemia and metabolic imbalance. Unable to obtain MRI of brain due to body habitus.  4. Tobacco abuse. Counseled on the importance of tobacco cessation. 5. Morbid obesity: BMI 55.8 nutritional consult  6. Probable sleep apnea. Will need outpatient sleep study. 7. Hypothyroidism: TSH 38.1. Will obtain free T4. Start synthroid. Will need follow recheck 4-6 weeks 8. Skin abscess: right breast x2. S/p I&D right lateral chest under breast site clean and dry. Continue vanc and zoxyn as noted above   Code Status: full Family Communication: fiance Disposition Plan: home hopefully tom[place]   Consultants:  Diabetes coordinator  Procedures:  I&D in ED  Antibiotics:  Vancomycin 06/05/15>>  Zosyn 06/05/15>>  HPI/Subjective: Sitting on side of bed eating. Denies pain/discomfort. Reports feeling "so much better"  Objective: Filed Vitals:   06/07/15 1406  BP: 126/75  Pulse: 62  Temp:   Resp: 20    Intake/Output Summary (Last 24 hours) at 06/07/15 1430 Last data filed at 06/07/15 1417  Gross per 24 hour  Intake 3683.33 ml  Output    600 ml  Net 3083.33 ml   Filed Weights   06/05/15 2333  Weight: 142.7 kg (314 lb 9.5 oz)    Exam:   General:  obese  Cardiovascular: RRR no MGR trace LE edema  Respiratory: normal effort BS distant but clear  Abdomen: obese  soft +BS non-tender to palpation  Musculoskeletal: no clubbing or cyanosis  Skin: all skin folds, perineum, axilla with plaques, scaling erythema.  right chest lateral aspect under breast s/p I&D site clean and dry. Medial aspect right breast with open abscess draining bloody drainage. Perimeter skin with erythema, tenderness.    Data Reviewed: Basic Metabolic Panel:  Recent Labs Lab 06/05/15 1847 06/05/15 2238 06/06/15 0211 06/06/15 0551 06/07/15 0611  NA 130* 136 136 136 134*  K 4.7 3.9 3.8 4.4 3.9  CL 93* 99* 101 101 99*  CO2 GLUCOSE 743* 406* 311* 276* 290*  BUN CREATININE 0.93 0.87 0.76 0.71 0.75  CALCIUM 10.0 9.5 9.3 9.5 9.0   Liver Function Tests: No results for input(s): AST, ALT, ALKPHOS, BILITOT, PROT, ALBUMIN in the last 168 hours. No results for input(s): LIPASE, AMYLASE in the last 168 hours. No results for input(s): AMMONIA in the last 168 hours. CBC:  Recent Labs Lab 06/05/15 1847 06/05/15 2238 06/07/15 0611  WBC 12.2* 13.8* 9.5  NEUTROABS 10.4*  --   --   HGB 14.4 14.5 13.5  HCT 44.3 44.3 42.1  MCV 96.5 95.9 97.2  PLT 409* 430* 382   Cardiac Enzymes: No results for input(s): CKTOTAL, CKMB, CKMBINDEX, TROPONINI in the last 168 hours. BNP (last 3 results) No results for input(s): BNP in the last 8760 hours.  ProBNP (last 3 results) No results for input(s): PROBNP in the last 8760 hours.  CBG:  Recent Labs Lab 06/06/15 1140 06/06/15 1611 06/06/15 2045 06/07/15 0734 06/07/15 1117  GLUCAP  398* 355* 274* 268* 255*    Recent Results (from the past 240 hour(s))  Culture, blood (routine x 2)     Status: None (Preliminary result)   Collection Time: 06/05/15  6:47 PM  Result Value Ref Range Status   Specimen Description RIGHT ANTECUBITAL DRAWN BY RN  Final   Special Requests BOTTLES DRAWN AEROBIC AND ANAEROBIC 6CC  Final   Culture NO GROWTH 2 DAYS  Final   Report Status PENDING  Incomplete  Urine culture      Status: None   Collection Time: 06/05/15  7:01 PM  Result Value Ref Range Status   Specimen Description URINE, CLEAN CATCH  Final   Special Requests NONE  Final   Colony Count   Final    >=100,000 COLONIES/ML Performed at [facility]    Culture   Final    Multiple bacterial morphotypes present, none predominant. Suggest appropriate recollection if clinically indicated. Performed at [facility]    Report Status 06/07/2015 FINAL  Final  Culture, blood (routine x 2)     Status: None (Preliminary result)   Collection Time: 06/05/15  7:48 PM  Result Value Ref Range Status   Specimen Description BLOOD RIGHT HAND  Final   Special Requests BOTTLES DRAWN AEROBIC AND ANAEROBIC 6CC  Final   Culture NO GROWTH 2 DAYS  Final   Report Status PENDING  Incomplete  MRSA PCR Screening     Status: None   Collection Time: 06/05/15 11:50 PM  Result Value Ref Range Status   MRSA by PCR NEGATIVE NEGATIVE Final    Comment:        The GeneXpert MRSA Assay (FDA approved for NASAL specimens only), is one component of a comprehensive MRSA colonization surveillance program. It is not intended to diagnose MRSA infection nor to guide or monitor treatment for MRSA infections.      Studies: Ct Head Wo Contrast  06/06/2015   CLINICAL DATA:  Confusion hyperglycemia; new diagnosis of diabetes  EXAM: CT HEAD WITHOUT CONTRAST  TECHNIQUE: Contiguous axial images were obtained from the base of the skull through the vertex without intravenous contrast.  COMPARISON:  None.  FINDINGS: Mild atrophy. There is no evidence of acute intracranial hemorrhage, brain edema, mass lesion, acute infarction, mass effect, or midline shift. Acute infarct may be inapparent on noncontrast CT. No other intra-axial abnormalities are seen, and the ventricles and sulci are within normal limits in size and symmetry. No abnormal extra-axial fluid collections or masses are identified. No significant calvarial abnormality.   IMPRESSION: 1. Negative for bleed or other acute intracranial process.   Electronically Signed   By: Corlis Leak  Hassell M.D.   On: 06/06/2015 12:55   Dg Chest Portable 1 View  06/05/2015   CLINICAL DATA:  Patient complains of cough, for 1 week. Hyperglycemia.  EXAM: PORTABLE CHEST - 1 VIEW  COMPARISON:  None.  FINDINGS: The heart is enlarged. Marked elevation of the LEFT hemidiaphragm. Mild vascular congestion. No definite active infiltrates or failure. No osseous findings.  IMPRESSION: Cardiomegaly. Marked elevation LEFT hemidiaphragm. No definite active infiltrates or failure.   Electronically Signed   By: Davonna BellingJohn  Curnes M.D.   On: 06/05/2015 19:46    Scheduled Meds: . fluconazole (DIFLUCAN) IV  100 mg Intravenous Q24H  . heparin  5,000 Units Subcutaneous 3 times per day  . insulin aspart  0-15 Units Subcutaneous TID WC  . insulin aspart  0-5 Units Subcutaneous QHS  . insulin aspart protamine- aspart  30  Units Subcutaneous BID WC  . [START ON 06/08/2015] levothyroxine  75 mcg Oral QAC breakfast  . nicotine  21 mg Transdermal Daily  . nystatin   Topical TID  . piperacillin-tazobactam (ZOSYN)  IV  3.375 g Intravenous Q8H  . vancomycin  1,500 mg Intravenous Q12H   Continuous Infusions: . sodium chloride 50 mL/hr at 06/07/15 1211    Principal Problem:   Hyperglycemia due to type 2 diabetes mellitus Active Problems:   Skin abscess   Morbid obesity due to excess calories   Tobacco abuse   Uncontrolled diabetes mellitus   Hypothyroidism   Intertrigo    Time spent: 40 minutes    [facility] M  [facility] Pager [phone]. If 7PM-7AM, please contact night-coverage at [email or web address], password [facility] 06/07/2015, 2:30 PM  LOS: 2 days

## 2015-06-07 NOTE — Progress Notes (Signed)
Patient O2 sat on room air 95%.

## 2015-06-08 ENCOUNTER — Encounter (HOSPITAL_COMMUNITY): Payer: Self-pay | Admitting: Internal Medicine

## 2015-06-08 DIAGNOSIS — L03313 Cellulitis of chest wall: Secondary | ICD-10-CM

## 2015-06-08 DIAGNOSIS — L02213 Cutaneous abscess of chest wall: Principal | ICD-10-CM

## 2015-06-08 DIAGNOSIS — E1165 Type 2 diabetes mellitus with hyperglycemia: Secondary | ICD-10-CM

## 2015-06-08 LAB — BASIC METABOLIC PANEL
Anion gap: 8 (ref 5–15)
BUN: 8 mg/dL (ref 6–20)
CALCIUM: 9.1 mg/dL (ref 8.9–10.3)
CHLORIDE: 100 mmol/L — AB (ref 101–111)
CO2: 27 mmol/L (ref 22–32)
CREATININE: 0.67 mg/dL (ref 0.44–1.00)
GFR calc Af Amer: >60 mL/min (ref >60)
GFR calc non Af Amer: >60 mL/min (ref >60)
Glucose, Bld: 238 mg/dL — ABNORMAL HIGH (ref 65–99)
Potassium: 3.9 mmol/L (ref 3.5–5.1)
SODIUM: 135 mmol/L (ref 135–145)

## 2015-06-08 LAB — GLUCOSE, CAPILLARY
GLUCOSE-CAPILLARY: 228 mg/dL — AB (ref 65–99)
GLUCOSE-CAPILLARY: 212 mg/dL — AB (ref 65–99)

## 2015-06-08 LAB — CBC
HCT: 43.2 % (ref 36.0–46.0)
Hemoglobin: 13.8 g/dL (ref 12.0–15.0)
MCH: 30.9 pg (ref 26.0–34.0)
MCHC: 31.9 g/dL (ref 30.0–36.0)
MCV: 96.6 fL (ref 78.0–100.0)
PLATELETS: 379 10*3/uL (ref 150–400)
RBC: 4.47 MIL/uL (ref 3.87–5.11)
RDW: 14.3 % (ref 11.5–15.5)
WBC: 8.5 10*3/uL (ref 4.0–10.5)

## 2015-06-08 MED ORDER — FLUCONAZOLE 100 MG PO TABS
100.0000 mg | ORAL_TABLET | Freq: Every day | ORAL | Status: DC
  Administered 2015-06-08: 100 mg via ORAL
  Filled 2015-06-08: qty 1

## 2015-06-08 MED ORDER — FLUCONAZOLE 100 MG PO TABS: 100.0000 mg | ORAL_TABLET | Freq: Every day | ORAL | Status: DC

## 2015-06-08 MED ORDER — INSULIN ASPART PROT & ASPART (70-30 MIX) 100 UNIT/ML ~~LOC~~ SUSP
35.0000 [IU] | Freq: Two times a day (BID) | SUBCUTANEOUS | Status: DC
  Filled 2015-06-08: qty 10

## 2015-06-08 MED ORDER — NICOTINE 21 MG/24HR TD PT24: 21.0000 mg | MEDICATED_PATCH | Freq: Every day | TRANSDERMAL | Status: DC

## 2015-06-08 MED ORDER — NYSTATIN 100000 UNIT/GM EX POWD: 1.0000 | Freq: Three times a day (TID) | CUTANEOUS | Status: DC

## 2015-06-08 MED ORDER — INSULIN ASPART PROT & ASPART (70-30 MIX) 100 UNIT/ML ~~LOC~~ SUSP: 35.0000 [IU] | Freq: Two times a day (BID) | SUBCUTANEOUS | Status: DC

## 2015-06-08 MED ORDER — LEVOTHYROXINE SODIUM 75 MCG PO TABS: 75.0000 ug | ORAL_TABLET | Freq: Every day | ORAL | Status: DC

## 2015-06-08 MED ORDER — DOXYCYCLINE HYCLATE 100 MG PO TABS: 100.0000 mg | ORAL_TABLET | Freq: Two times a day (BID) | ORAL | Status: DC

## 2015-06-08 MED ORDER — DOXYCYCLINE HYCLATE 100 MG PO TABS
100.0000 mg | ORAL_TABLET | Freq: Two times a day (BID) | ORAL | Status: DC
  Administered 2015-06-08: 100 mg via ORAL
  Filled 2015-06-08: qty 1

## 2015-06-08 NOTE — Care Management Note (Signed)
Case Management Note  Patient Details  Name: Tracy Graves MRN: [id number] Date of Birth: June 06, 1961  Subjective/Objective:                    Action/Plan:   Expected Discharge Date:  06/08/15               Expected Discharge Plan:  Home/Self Care  In-House Referral:  Financial Counselor  Discharge planning Services  CM Consult, MATCH Program, Indigent Health Clinic  Post Acute Care Choice:  NA Choice offered to:  NA  DME Arranged:    DME Agency:     HH Arranged:    HH Agency:     Status of Service:  Completed, signed off  Medicare Important Message Given:    Date Medicare IM Given:    Medicare IM give by:    Date Additional Medicare IM Given:    Additional Medicare Important Message give by:     If discussed at Long Length of Stay Meetings, dates discussed:    Additional Comments: Pt discharged home today. Pt given MATCH voucher for medication assistance. [facility]. Pt also given information and direction on [facility]. Referral also made to congregational RN program. Pt and pts nurse aware of discharge arrangements. Arlyss QueenBlackwell, Kiam Bransfield Arroyo Gr[place]owder, RN 06/08/2015, 11:31 AM

## 2015-06-08 NOTE — Progress Notes (Signed)
Inpatient Diabetes Program Recommendations  AACE/ADA: New Consensus Statement on Inpatient Glycemic Control (2013)  Target Ranges:  Prepandial:   less than 140 mg/dL      Peak postprandial:   less than 180 mg/dL (1-2 hours)      Critically ill patients:  140 - 180 mg/dL   Results for Donn PieriniFRENCH, Colbie F (MRN [id number]) as of 06/08/2015 07:30  Ref. Range 06/07/2015 07:34 06/07/2015 11:17 06/07/2015 16:42 06/07/2015 21:21 06/08/2015 07:16  Glucose-Capillary Latest Ref Range: 65-99 mg/dL 784268 (H) 696255 (H) 295244 (H) 278 (H) 228 (H)   Current orders for Inpatient glycemic control: 70/30 30 units BID, Novolog 0-15 units TID with meals, Novolog 0-5 units HS  Inpatient Diabetes Program Recommendations Insulin - Basal: Please consider increase 70/30 to 35 units BID.  Thanks, Orlando PennerMarie Lawayne Hartig, RN, MSN, CCRN, CDE Diabetes Coordinator Inpatient Diabetes Program [phone] (Team Pager from 8am to 5pm) [phone] ([place] office) [phone] [facility] office) [phone] [facility] office)

## 2015-06-08 NOTE — Progress Notes (Signed)
1223 Patient d/c home, d/c instructions, paperwork & hard Rxs given to patient. Patient able to demonstrate drawing insulin up in insulin syringe and administering it to herself appropriately this shift. Detailed education given to patient regarding managing diabetes, daily blood sugar checks, diet and insulin administration. IV catheter removed, catheter tip intact, no s/s of infection/infiltration noted, patient tolerated well w/no c/o pain or discomfort noted. Family at bedside during d/c instructions to transport patient home.

## 2015-06-08 NOTE — Discharge Summary (Signed)
Physician Discharge Summary  Tracy Graves ZOX:[id number] DOB: June 25, 1961 DOA: 06/05/2015  PCP: No primary care provider on file.  Admit date: 06/05/2015 Discharge date: 06/08/2015  Time spent: 40 minutes  Recommendations for Outpatient Follow-up:  1. [facility] for evaluation of Diabetes control. Will need repeat TSH in 4 weeks as synthroid started.  Discharge Diagnoses:  Principal Problem:   Hyperglycemia due to type 2 diabetes mellitus Active Problems:   Skin abscess   Morbid obesity due to excess calories   Tobacco abuse   Uncontrolled diabetes mellitus   Hypothyroidism   Intertrigo   Abscess of chest wall   Cellulitis of chest wall   Dysarthria   Lactic acidosis   Discharge Condition: stable  Diet recommendation: carb modified  Filed Weights   06/05/15 2333  Weight: 142.7 kg (314 lb 9.5 oz)    History of present illness:  This is a 54 year old lady presented to ED on 06/05/15 complaining of a one-week history of polyuria and polydipsia. She was found by her family  to be somewhat confused as well.  She denied any fever. She also had a history of skin abscesses. She is a smoker. Evaluation in the emergency room found  severe hyperglycemia and therefore newly diagnosed diabetes. She was not in DKA. She also had skin abscesses, one of which was incised and drained by the ER physician.    Hospital Course:  1. New-onset diabetes, likely type II. No evidence of DKA. Controlled with  70/30 insulin 35 units BID.  Diabetes coordinator evaluated and educated patient to diet, insulin administration and CBG monitoring. Will discharge with 70/30. Has follow up appointment 06/13/15. Will need close OP follow up for optimal control.  2. Severe intertrigo. Likely has underlying candidal intertrigo with superimposed bacterial cellulitis. Provided with Vancomycin and Zosyn for 3 days as well as nysatin powder and fluconazole. Will discharge with doxy and fluconazole 3. Chest wall  abscess and right breast abscess. Lactic acid level elevated on admission.  Chest wall abscess s/p I & D healing well with little to no drainage, right breast abscess opened spontaniuosly drained moderate amount bloody drainage with white pus. No signs/sxs sepsis.  Will discharge with doxy. Follow up scheduled for 06/13/15 4. Hypothyroidism: TSH 38.1 and free T4 0.53. Synthroid started. Will need repeat TSH 4 weeks.  5. Dysarthria. Resolved at discharge. Likely related to metabolic abnormalities. CT head negative. Unable to obtain MRI due to body habitus.  6. Tobacco abuse. Counseled on the importance of tobacco cessation. 7. Morbid obesity 8. Probable sleep apnea. Will need outpatient sleep study  Procedures:  I & D 06/05/15  Consultations:  none  Discharge Exam: Filed Vitals:   06/07/15 2205  BP: 151/89  Pulse: 86  Temp: 98.3 F (36.8 C)  Resp: 20    General: morbidly obese appears well Cardiovascular: HS distant but regular no MGR Respiratory: normal effort BS clear Skin: right chest wall abscess s/p I&D healing well no drainage, abscess medial aspect right breast open and draining small amount serous drainage. No odor. Less swelling and induration. No fluctuance. Axilla and perineum and skin folds with intertrigo.  Discharge Instructions    Current Discharge Medication List    START taking these medications   Details  doxycycline (VIBRA-TABS) 100 MG tablet Take 1 tablet (100 mg total) by mouth every 12 (twelve) hours. Qty: 10 tablet, Refills: 0    fluconazole (DIFLUCAN) 100 MG tablet Take 1 tablet (100 mg total) by mouth daily. Qty: 5  tablet, Refills: 0    insulin aspart protamine- aspart (NOVOLOG MIX 70/30) (70-30) 100 UNIT/ML injection Inject 0.35 mLs (35 Units total) into the skin 2 (two) times daily with a meal. Qty: 10 mL, Refills: 11    levothyroxine (SYNTHROID, LEVOTHROID) 75 MCG tablet Take 1 tablet (75 mcg total) by mouth daily before breakfast. Qty: 60  tablet, Refills: 1    nicotine (NICODERM CQ - DOSED IN MG/24 HOURS) 21 mg/24hr patch Place 1 patch (21 mg total) onto the skin daily. Qty: 28 patch, Refills: 0    nystatin (MYCOSTATIN/NYSTOP) 100000 UNIT/GM POWD Apply 1 Bottle topically 3 (three) times daily. Qty: 1 Bottle, Refills: 0      CONTINUE these medications which have NOT CHANGED   Details  traMADol (ULTRAM) 50 MG tablet Take 1 tablet (50 mg total) by mouth every 6 (six) hours as needed. Qty: 30 tablet, Refills: 0      STOP taking these medications     penicillin v potassium (VEETID) 500 MG tablet        No Known Allergies Follow-up Information    [facility]   Why:  at 11:00   Contact information:   [address] [place] [address] [phone]        The results of significant diagnostics from this hospitalization (including imaging, microbiology, ancillary and laboratory) are listed below for reference.    Significant Diagnostic Studies: Ct Head Wo Contrast  06/06/2015   CLINICAL DATA:  Confusion hyperglycemia; new diagnosis of diabetes  EXAM: CT HEAD WITHOUT CONTRAST  TECHNIQUE: Contiguous axial images were obtained from the base of the skull through the vertex without intravenous contrast.  COMPARISON:  None.  FINDINGS: Mild atrophy. There is no evidence of acute intracranial hemorrhage, brain edema, mass lesion, acute infarction, mass effect, or midline shift. Acute infarct may be inapparent on noncontrast CT. No other intra-axial abnormalities are seen, and the ventricles and sulci are within normal limits in size and symmetry. No abnormal extra-axial fluid collections or masses are identified. No significant calvarial abnormality.  IMPRESSION: 1. Negative for bleed or other acute intracranial process.   Electronically Signed   By: Corlis Leak  Hassell M.D.   On: 06/06/2015 12:55   Dg Chest Portable 1 View  06/05/2015   CLINICAL DATA:  Patient complains of cough, for 1 week.  Hyperglycemia.  EXAM: PORTABLE CHEST - 1 VIEW  COMPARISON:  None.  FINDINGS: The heart is enlarged. Marked elevation of the LEFT hemidiaphragm. Mild vascular congestion. No definite active infiltrates or failure. No osseous findings.  IMPRESSION: Cardiomegaly. Marked elevation LEFT hemidiaphragm. No definite active infiltrates or failure.   Electronically Signed   By: Davonna BellingJohn  Curnes M.D.   On: 06/05/2015 19:46    Microbiology: Recent Results (from the past 240 hour(s))  Culture, blood (routine x 2)     Status: None (Preliminary result)   Collection Time: 06/05/15  6:47 PM  Result Value Ref Range Status   Specimen Description RIGHT ANTECUBITAL DRAWN BY RN  Final   Special Requests BOTTLES DRAWN AEROBIC AND ANAEROBIC 6CC  Final   Culture NO GROWTH 3 DAYS  Final   Report Status PENDING  Incomplete  Urine culture     Status: None   Collection Time: 06/05/15  7:01 PM  Result Value Ref Range Status   Specimen Description URINE, CLEAN CATCH  Final   Special Requests NONE  Final   Colony Count   Final    >=  100,000 COLONIES/ML Performed at [facility]   Final    Multiple bacterial morphotypes present, none predominant. Suggest appropriate recollection if clinically indicated. Performed at [facility]    Report Status 06/07/2015 FINAL  Final  Culture, blood (routine x 2)     Status: None (Preliminary result)   Collection Time: 06/05/15  7:48 PM  Result Value Ref Range Status   Specimen Description BLOOD RIGHT HAND  Final   Special Requests BOTTLES DRAWN AEROBIC AND ANAEROBIC 6CC  Final   Culture NO GROWTH 3 DAYS  Final   Report Status PENDING  Incomplete  MRSA PCR Screening     Status: None   Collection Time: 06/05/15 11:50 PM  Result Value Ref Range Status   MRSA by PCR NEGATIVE NEGATIVE Final    Comment:        The GeneXpert MRSA Assay (FDA approved for NASAL specimens only), is one component of a comprehensive MRSA colonization surveillance program. It is  not intended to diagnose MRSA infection nor to guide or monitor treatment for MRSA infections.      Labs: Basic Metabolic Panel:  Recent Labs Lab 06/05/15 2238 06/06/15 0211 06/06/15 0551 06/07/15 0611 06/08/15 0622  NA 136 136 136 134* 135  K 3.9 3.8 4.4 3.9 3.9  CL 99* 101 101 99* 100*  CO2 GLUCOSE 406* 311* 276* 290* 238*  BUN CREATININE 0.87 0.76 0.71 0.75 0.67  CALCIUM 9.5 9.3 9.5 9.0 9.1   Liver Function Tests: No results for input(s): AST, ALT, ALKPHOS, BILITOT, PROT, ALBUMIN in the last 168 hours. No results for input(s): LIPASE, AMYLASE in the last 168 hours. No results for input(s): AMMONIA in the last 168 hours. CBC:  Recent Labs Lab 06/05/15 1847 06/05/15 2238 06/07/15 0611 06/08/15 0622  WBC 12.2* 13.8* 9.5 8.5  NEUTROABS 10.4*  --   --   --   HGB 14.4 14.5 13.5 13.8  HCT 44.3 44.3 42.1 43.2  MCV 96.5 95.9 97.2 96.6  PLT 409* 430* 382 379   Cardiac Enzymes: No results for input(s): CKTOTAL, CKMB, CKMBINDEX, TROPONINI in the last 168 hours. BNP: BNP (last 3 results) No results for input(s): BNP in the last 8760 hours.  ProBNP (last 3 results) No results for input(s): PROBNP in the last 8760 hours.  CBG:  Recent Labs Lab 06/07/15 0734 06/07/15 1117 06/07/15 1642 06/07/15 2121 06/08/15 0716  GLUCAP 268* 255* 244* 278* 228*       Signed:  BLACK,KAREN M  [facility] 06/08/2015, 11:13 AM

## 2015-06-11 LAB — CULTURE, BLOOD (ROUTINE X 2)
Culture: NO GROWTH
Culture: NO GROWTH

## 2015-08-08 ENCOUNTER — Encounter (HOSPITAL_COMMUNITY): Payer: Self-pay

## 2015-08-08 ENCOUNTER — Emergency Department (HOSPITAL_COMMUNITY)
Admission: EM | Admit: 2015-08-08 | Discharge: 2015-08-09 | Disposition: A | Discharge status: Home / Self Care | Payer: Self-pay | Attending: Emergency Medicine | Admitting: Emergency Medicine

## 2015-08-08 DIAGNOSIS — R000 Tachycardia, unspecified: Secondary | ICD-10-CM | POA: Insufficient documentation

## 2015-08-08 DIAGNOSIS — Z720 Tobacco use: Secondary | ICD-10-CM | POA: Insufficient documentation

## 2015-08-08 DIAGNOSIS — E1165 Type 2 diabetes mellitus with hyperglycemia: Secondary | ICD-10-CM | POA: Insufficient documentation

## 2015-08-08 DIAGNOSIS — Z79899 Other long term (current) drug therapy: Secondary | ICD-10-CM | POA: Insufficient documentation

## 2015-08-08 DIAGNOSIS — L02412 Cutaneous abscess of left axilla: Secondary | ICD-10-CM | POA: Insufficient documentation

## 2015-08-08 DIAGNOSIS — R110 Nausea: Secondary | ICD-10-CM | POA: Insufficient documentation

## 2015-08-08 DIAGNOSIS — E039 Hypothyroidism, unspecified: Secondary | ICD-10-CM | POA: Insufficient documentation

## 2015-08-08 DIAGNOSIS — Z8614 Personal history of Methicillin resistant Staphylococcus aureus infection: Secondary | ICD-10-CM | POA: Insufficient documentation

## 2015-08-08 LAB — CBG MONITORING, ED: Glucose-Capillary: 132 mg/dL — ABNORMAL HIGH (ref 65–99)

## 2015-08-08 MED ORDER — HYDROCODONE-ACETAMINOPHEN 5-325 MG PO TABS
2.0000 | ORAL_TABLET | Freq: Once | ORAL | Status: AC
  Administered 2015-08-08: 2 via ORAL
  Filled 2015-08-08: qty 2

## 2015-08-08 MED ORDER — DOXYCYCLINE HYCLATE 100 MG PO TABS
100.0000 mg | ORAL_TABLET | Freq: Once | ORAL | Status: AC
  Administered 2015-08-08: 100 mg via ORAL
  Filled 2015-08-08: qty 1

## 2015-08-08 MED ORDER — ONDANSETRON HCL 4 MG PO TABS
4.0000 mg | ORAL_TABLET | Freq: Once | ORAL | Status: AC
  Administered 2015-08-08: 4 mg via ORAL
  Filled 2015-08-08: qty 1

## 2015-08-08 MED ORDER — LIDOCAINE HCL (PF) 2 % IJ SOLN
10.0000 mL | Freq: Once | INTRAMUSCULAR | Status: AC
  Administered 2015-08-08: 10 mL
  Filled 2015-08-08: qty 10

## 2015-08-08 NOTE — ED Notes (Signed)
Pt states she has an infection under her left armpit x 2 days.  Pt also states she thinks she has a yeast infection.

## 2015-08-09 MED ORDER — CLINDAMYCIN HCL 150 MG PO CAPS: ORAL_CAPSULE | ORAL | Status: DC

## 2015-08-09 MED ORDER — HYDROCODONE-ACETAMINOPHEN 5-325 MG PO TABS: 1.0000 | ORAL_TABLET | ORAL | Status: DC | PRN

## 2015-08-09 NOTE — ED Provider Notes (Signed)
CSN: [id number]     Arrival date & time 08/08/15  2240 History   First MD Initiated Contact with Patient 08/08/15 2316     Chief Complaint  Patient presents with  . Recurrent Skin Infections     (Consider location/radiation/quality/duration/timing/severity/associated sxs/prior Treatment) HPI Comments: Patient is a 54 year old female who presents to the emergency department with a complaint of infection under my left arm. The patient states that she noticed a pimple about 2 or 3 days ago. Today she noticed it was swollen and extremely tender. She states that she has noticed that she has been nauseated over last few days, and it is been more difficult to control her glucose. She has not measured a temperature. She has had problems with abscess areas in the past. She has not been told that she had a problem with methicillin-resistant staph in the past.  The history is provided by the patient.    Past Medical History  Diagnosis Date  . Hyperglycemia due to type 2 diabetes mellitus 06/05/2015  . Morbid obesity due to excess calories 06/05/2015  . Hypothyroid     2016  . Cellulitis   . Intertrigo   . Abscess     chest wall, right breast  . Tobacco use    History reviewed. No pertinent past surgical history. No family history on file. History  Substance Use Topics  . Smoking status: Current Every Day Smoker -- 1.00 packs/day    Types: Cigarettes  . Smokeless tobacco: Current User  . Alcohol Use: No   OB History    No data available     Review of Systems  Constitutional: Positive for activity change and fatigue.  Gastrointestinal: Positive for nausea.  Musculoskeletal: Positive for myalgias.  Skin: Positive for wound.  All other systems reviewed and are negative.     Allergies  Review of patient's allergies indicates no known allergies.  Home Medications   Prior to Admission medications   Medication Sig Start Date End Date Taking? Authorizing Provider  glipiZIDE  (GLUCOTROL) 5 MG tablet Take by mouth 2 (two) times daily before a meal.   Yes Historical Provider, MD  levothyroxine (SYNTHROID, LEVOTHROID) 75 MCG tablet Take 1 tablet (75 mcg total) by mouth daily before breakfast. Patient taking differently: Take 100 mcg by mouth daily before breakfast.  06/08/15  Yes Lesle Chris Black, NP  lisinopril (PRINIVIL,ZESTRIL) 5 MG tablet Take 5 mg by mouth daily.   Yes Historical Provider, MD  metFORMIN (GLUCOPHAGE) 500 MG tablet Take by mouth 2 (two) times daily with a meal.   Yes Historical Provider, MD  clindamycin (CLEOCIN) 150 MG capsule 2 po tid with food 08/09/15   Ivery Quale, PA-C  doxycycline (VIBRA-TABS) 100 MG tablet Take 1 tablet (100 mg total) by mouth every 12 (twelve) hours. 06/08/15   Gwenyth Bender, NP  fluconazole (DIFLUCAN) 100 MG tablet Take 1 tablet (100 mg total) by mouth daily. 06/08/15   Gwenyth Bender, NP  HYDROcodone-acetaminophen (NORCO/VICODIN) 5-325 MG per tablet Take 1-2 tablets by mouth every 4 (four) hours as needed. 08/09/15   Ivery Quale, PA-C  nicotine (NICODERM CQ - DOSED IN MG/24 HOURS) 21 mg/24hr patch Place 1 patch (21 mg total) onto the skin daily. 06/08/15   Gwenyth Bender, NP  nystatin (MYCOSTATIN/NYSTOP) 100000 UNIT/GM POWD Apply 1 Bottle topically 3 (three) times daily. 06/08/15   Gwenyth Bender, NP  traMADol (ULTRAM) 50 MG tablet Take 1 tablet (50 mg total) by mouth every 6 (six)  hours as needed. 03/24/15   Earley Favor, NP   BP 128/74 mmHg  Pulse 108  Temp(Src) 98 F (36.7 C) (Oral)  Resp 24  Ht 5\' 3"  (1.6 m)  Wt 271 lb (122.925 kg)  BMI 48.02 kg/m2  SpO2 97% Physical Exam  Constitutional: She is oriented to person, place, and time. She appears well-developed and well-nourished.  Non-toxic appearance.  HENT:  Head: Normocephalic.  Right Ear: Tympanic membrane and external ear normal.  Left Ear: Tympanic membrane and external ear normal.  Eyes: EOM and lids are normal. Pupils are equal, round, and reactive to light.  Neck: Normal  range of motion. Neck supple. Carotid bruit is not present.  Cardiovascular: Regular rhythm, normal heart sounds, intact distal pulses and normal pulses.  Tachycardia present.   Pulmonary/Chest: Breath sounds normal. No respiratory distress.  Abdominal: Soft. Bowel sounds are normal. There is no tenderness. There is no guarding.  Musculoskeletal: Normal range of motion.  Abscess of the left axilla. Tender to palpation with increased redness present. No red streaking noted. No drainage at this time.  Lymphadenopathy:       Head (right side): No submandibular adenopathy present.       Head (left side): No submandibular adenopathy present.    She has no cervical adenopathy.  Neurological: She is alert and oriented to person, place, and time. She has normal strength. No cranial nerve deficit or sensory deficit.  Skin: Skin is warm and dry.  Psychiatric: She has a normal mood and affect. Her speech is normal.  Nursing note and vitals reviewed.   ED Course  INCISION AND DRAINAGE Date/Time: 08/09/2015 12:59 AM Performed by: Ivery Quale Authorized by: Ivery Quale Consent: Verbal consent obtained. Risks and benefits: risks, benefits and alternatives were discussed Consent given by: patient Patient understanding: patient states understanding of the procedure being performed Patient identity confirmed: arm band Time out: Immediately prior to procedure a "time out" was called to verify the correct patient, procedure, equipment, support staff and site/side marked as required. Type: abscess Location: left axilla. Anesthesia: local infiltration Local anesthetic: lidocaine 2% without epinephrine Patient sedated: no Scalpel size: 11 Incision type: single straight Complexity: simple Drainage: purulent Drainage amount: copious Wound treatment: wound left open Patient tolerance: Patient tolerated the procedure well with no immediate complications   (including critical care time) Labs  Review Labs Reviewed  CBG MONITORING, ED - Abnormal; Notable for the following:    Glucose-Capillary 132 (*)    All other components within normal limits  CULTURE, ROUTINE-ABSCESS    Imaging Review No results found.   EKG Interpretation None      MDM  Vital signs reviewed. Patient noted to have an abscess of the left axilla. Incision and drainage carried out. Culture sent to the lab. The plan at this time is for the patient to be on clindamycin 3 times daily, Tylenol or ibuprofen for mild discomfort. Norco for more severe pain. The patient acknowledges these instructions. The patient also knowledge is the need to see her primary physician or return here if any unusual changes, problems, or concerns.    Final diagnoses:  Abscess of axilla, left    **I have reviewed nursing notes, vital signs, and all appropriate lab and imaging results for this patient.Ivery Quale, PA-C 08/09/15 1610  Devoria Albe, MD 08/09/15 [phone]

## 2015-08-09 NOTE — Discharge Instructions (Signed)
Please soak wound in warm Epsom salt water daily for 15-20 minutes until the wound has healed from the inside out. Use clindamycin 3 times daily with food. Use Tylenol or ibuprofen for mild discomfort. Use Norco for more severe pain. This medication may cause drowsiness, please use with caution. Please see your primary physician for follow-up and recheck of this wound. Please return to the emergency department if any emergent changes, problems, or concerns. Abscess Care After An abscess (also called a boil or furuncle) is an infected area that contains a collection of pus. Signs and symptoms of an abscess include pain, tenderness, redness, or hardness, or you may feel a moveable soft area under your skin. An abscess can occur anywhere in the body. The infection may spread to surrounding tissues causing cellulitis. A cut (incision) by the surgeon was made over your abscess and the pus was drained out. Gauze may have been packed into the space to provide a drain that will allow the cavity to heal from the inside outwards. The boil may be painful for 5 to 7 days. Most people with a boil do not have high fevers. Your abscess, if seen early, may not have localized, and may not have been lanced. If not, another appointment may be required for this if it does not get better on its own or with medications. HOME CARE INSTRUCTIONS   Only take over-the-counter or prescription medicines for pain, discomfort, or fever as directed by your caregiver.  When you bathe, soak and then remove gauze or iodoform packs at least daily or as directed by your caregiver. You may then wash the wound gently with mild soapy water. Repack with gauze or do as your caregiver directs. SEEK IMMEDIATE MEDICAL CARE IF:   You develop increased pain, swelling, redness, drainage, or bleeding in the wound site.  You develop signs of generalized infection including muscle aches, chills, fever, or a general ill feeling.  An oral temperature  above 102 F (38.9 C) develops, not controlled by medication. See your caregiver for a recheck if you develop any of the symptoms described above. If medications (antibiotics) were prescribed, take them as directed. Document Released: 07/05/2005 Document Revised: 03/10/2012 Document Reviewed: 03/01/2008 [facility] Patient Information 2015 [place], [place]. This information is not intended to replace advice given to you by your health care provider. Make sure you discuss any questions you have with your health care provider.

## 2015-08-09 NOTE — ED Notes (Signed)
Patient given Ginger ale and ice chips

## 2015-08-10 LAB — CBG MONITORING, ED: GLUCOSE-CAPILLARY: 118 mg/dL — AB (ref 65–99)

## 2015-08-12 LAB — CULTURE, ROUTINE-ABSCESS

## 2015-12-12 DIAGNOSIS — Z7689 Persons encountering health services in other specified circumstances: Secondary | ICD-10-CM

## 2015-12-30 DIAGNOSIS — Z7689 Persons encountering health services in other specified circumstances: Secondary | ICD-10-CM

## 2015-12-30 NOTE — Congregational Nurse Program (Deleted)
Congregational Nurse Program Note  Date of Encounter: 12/30/2015  Past Medical History: Past Medical History  Diagnosis Date   Hyperglycemia due to type 2 diabetes mellitus 06/05/2015   Morbid obesity due to excess calories 06/05/2015   Hypothyroid     2016   Cellulitis    Intertrigo    Abscess     chest wall, right breast   Tobacco use     Encounter Details:     CNP Questionnaire - 12/30/15 1054    Patient Demographics   Is this a new or existing patient? New   Patient is considered a/an Not Applicable   Race Caucasian/White   Patient Assistance   Location of Patient Assistance Not Applicable   Patient's financial/insurance status Low Income   Uninsured Patient Yes   Interventions Counseled to make appt. with provider;Appt. has been completed;Assisted patient in making appt.   Patient referred to apply for the following financial assistance Not Applicable   Food insecurities addressed Not Applicable   Transportation assistance No   Assistance securing medications No   Educational health offerings Navigating the healthcare system;Medications;Nutrition   Encounter Details   Primary purpose of visit Acute Illness/Condition Visit;Post PCP Visit   Was an Emergency Department visit averted? Yes   Does patient have a medical provider? Yes   Patient referred to Establish PCP   Was a mental health screening completed? (GAINS tool) No   Does patient have dental issues? No   Since previous encounter, have you referred patient for abnormal blood pressure that resulted in a new diagnosis or medication change? No   Since previous encounter, have you referred patient for abnormal blood glucose that resulted in a new diagnosis or medication change? No    [facility] 01/12/16 @9 :30 am

## 2015-12-30 NOTE — Congregational Nurse Program (Signed)
Congregational Nurse Program Note  Date of Encounter: 12/12/2015  Past Medical History: Past Medical History  Diagnosis Date   Hyperglycemia due to type 2 diabetes mellitus 06/05/2015   Morbid obesity due to excess calories 06/05/2015   Hypothyroid     2016   Cellulitis    Intertrigo    Abscess     chest wall, right breast   Tobacco use     Encounter Details:     CNP Questionnaire - 12/30/15 1054    Patient Demographics   Is this a new or existing patient? New   Patient is considered a/an Not Applicable   Race Caucasian/White   Patient Assistance   Location of Patient Assistance Not Applicable   Patient's financial/insurance status Low Income   Uninsured Patient Yes   Interventions Counseled to make appt. with provider;Appt. has been completed;Assisted patient in making appt.   Patient referred to apply for the following financial assistance Not Applicable   Food insecurities addressed Not Applicable   Transportation assistance No   Assistance securing medications No   Educational health offerings Navigating the healthcare system;Medications;Nutrition   Encounter Details   Primary purpose of visit Acute Illness/Condition Visit;Post PCP Visit   Was an Emergency Department visit averted? Yes   Does patient have a medical provider? Yes   Patient referred to Establish PCP   Was a mental health screening completed? (GAINS tool) No   Does patient have dental issues? No   Since previous encounter, have you referred patient for abnormal blood pressure that resulted in a new diagnosis or medication change? No   Since previous encounter, have you referred patient for abnormal blood glucose that resulted in a new diagnosis or medication change? No    Follow-up appointment 01/12/16 at 9:00am, contacted

## 2016-01-03 NOTE — Progress Notes (Signed)
This encounter was created in error - please disregard.

## 2016-01-03 NOTE — Addendum Note (Signed)
Addended by: Hedda SladeSETTLE, PATRICIA A on: 01/03/2016 01:48 PM   Modules accepted: Level of Service, SmartSet

## 2016-03-21 DIAGNOSIS — Z139 Encounter for screening, unspecified: Secondary | ICD-10-CM

## 2016-06-12 DIAGNOSIS — Z139 Encounter for screening, unspecified: Secondary | ICD-10-CM

## 2016-08-24 NOTE — Congregational Nurse Program (Signed)
Congregational Nurse Program Note  Date of Encounter: 03/21/2016  Past Medical History: Past Medical History:  Diagnosis Date  . Abscess    chest wall, right breast  . Cellulitis   . Hyperglycemia due to type 2 diabetes mellitus 06/05/2015  . Hypothyroid    2016  . Intertrigo   . Morbid obesity due to excess calories 06/05/2015  . Tobacco use     Encounter Details:   Client was assisted with [facility] 04/17/16. Pearletha AlfredJan Olimpia Tinch,RN CNP  [phone]

## 2016-08-28 NOTE — Congregational Nurse Program (Signed)
Congregational Nurse Program Note  Date of Encounter: 03/21/2016  Past Medical History: Past Medical History:  Diagnosis Date  . Abscess    chest wall, right breast  . Cellulitis   . Hyperglycemia due to type 2 diabetes mellitus 06/05/2015  . Hypothyroid    2016  . Intertrigo   . Morbid obesity due to excess calories 06/05/2015  . Tobacco use     Encounter Details:  Client was assisted with follow up on Diabetes   [facility]  Scheduled 04/17/16. Pearletha AlfredJan Laelia Angelo,RN CN [phone].

## 2016-08-30 NOTE — Congregational Nurse Program (Signed)
Congregational Nurse Program Note  Date of Encounter: 06/12/2016  Past Medical History: Past Medical History:  Diagnosis Date  . Abscess    chest wall, right breast  . Cellulitis   . Hyperglycemia due to type 2 diabetes mellitus 06/05/2015  . Hypothyroid    2016  . Intertrigo   . Morbid obesity due to excess calories 06/05/2015  . Tobacco use     Encounter Details:  Client assisted with a follow-up appointment with PCP at [facility] 06/24/16.  Pearletha AlfredJan Yarlin Breisch,RN CNP  [phone]

## 2016-12-21 ENCOUNTER — Emergency Department (HOSPITAL_COMMUNITY)
Admission: EM | Admit: 2016-12-21 | Discharge: 2016-12-21 | Disposition: A | Discharge status: Home / Self Care | Payer: Self-pay | Attending: Emergency Medicine | Admitting: Emergency Medicine

## 2016-12-21 ENCOUNTER — Encounter (HOSPITAL_COMMUNITY): Payer: Self-pay | Admitting: Emergency Medicine

## 2016-12-21 DIAGNOSIS — E039 Hypothyroidism, unspecified: Secondary | ICD-10-CM | POA: Insufficient documentation

## 2016-12-21 DIAGNOSIS — N3001 Acute cystitis with hematuria: Secondary | ICD-10-CM | POA: Insufficient documentation

## 2016-12-21 DIAGNOSIS — E119 Type 2 diabetes mellitus without complications: Secondary | ICD-10-CM | POA: Insufficient documentation

## 2016-12-21 DIAGNOSIS — Z79899 Other long term (current) drug therapy: Secondary | ICD-10-CM | POA: Insufficient documentation

## 2016-12-21 DIAGNOSIS — Z7984 Long term (current) use of oral hypoglycemic drugs: Secondary | ICD-10-CM | POA: Insufficient documentation

## 2016-12-21 DIAGNOSIS — I10 Essential (primary) hypertension: Secondary | ICD-10-CM | POA: Insufficient documentation

## 2016-12-21 DIAGNOSIS — F17210 Nicotine dependence, cigarettes, uncomplicated: Secondary | ICD-10-CM | POA: Insufficient documentation

## 2016-12-21 HISTORY — DX: Essential (primary) hypertension: I10

## 2016-12-21 LAB — URINALYSIS, ROUTINE W REFLEX MICROSCOPIC
Bilirubin Urine: NEGATIVE
GLUCOSE, UA: NEGATIVE mg/dL
Ketones, ur: NEGATIVE mg/dL
Nitrite: NEGATIVE
PROTEIN: 100 mg/dL — AB
SPECIFIC GRAVITY, URINE: 1.024 (ref 1.005–1.030)
pH: 5 (ref 5.0–8.0)

## 2016-12-21 LAB — CBG MONITORING, ED: Glucose-Capillary: 96 mg/dL (ref 65–99)

## 2016-12-21 MED ORDER — PHENAZOPYRIDINE HCL 100 MG PO TABS
200.0000 mg | ORAL_TABLET | Freq: Once | ORAL | Status: AC
  Administered 2016-12-21: 200 mg via ORAL
  Filled 2016-12-21: qty 2

## 2016-12-21 MED ORDER — ONDANSETRON 4 MG PO TBDP
4.0000 mg | ORAL_TABLET | Freq: Once | ORAL | Status: AC
  Administered 2016-12-21: 4 mg via ORAL
  Filled 2016-12-21: qty 1

## 2016-12-21 MED ORDER — PHENAZOPYRIDINE HCL 200 MG PO TABS: 200.0000 mg | ORAL_TABLET | Freq: Three times a day (TID) | ORAL | 0 refills | Status: DC | PRN

## 2016-12-21 MED ORDER — ONDANSETRON 4 MG PO TBDP: 4.0000 mg | ORAL_TABLET | Freq: Three times a day (TID) | ORAL | 0 refills | Status: DC | PRN

## 2016-12-21 MED ORDER — CEPHALEXIN 250 MG PO CAPS
500.0000 mg | ORAL_CAPSULE | Freq: Once | ORAL | Status: AC
  Administered 2016-12-21: 500 mg via ORAL
  Filled 2016-12-21: qty 2

## 2016-12-21 MED ORDER — CEPHALEXIN 500 MG PO CAPS: 500.0000 mg | ORAL_CAPSULE | Freq: Four times a day (QID) | ORAL | 0 refills | Status: DC

## 2016-12-21 NOTE — ED Notes (Signed)
ED Provider at bedside. 

## 2016-12-21 NOTE — ED Triage Notes (Signed)
Pt states "im having really bad pain in my groin and my vaginal area, and when I went to the bathroom this morning, it was cloudy looking, I think it might be a UTI. I have diabetes. And I feel very nauseous. Ive had a uti before but i've never had pain in my groin". Pt states her blood sugars have been good the last few days.

## 2016-12-21 NOTE — Discharge Instructions (Signed)
Stay very well hydrated with plenty of water throughout the day. Please take antibiotic until completion. Use pyridium as directed to decrease painful urination but know that a common side effect is to turn your urine a bright orange/red color. This is not a harmful side effect. Please keep a close eye on your blood sugars as infections can make your blood sugar rise. Follow up with primary care physician in 1 week for recheck of ongoing symptoms.  Please seek immediate care if you develop the following: Your symptoms are no better or worse in 3 days. There is severe back pain or lower abdominal pain.  You develop chills.  You have a fever.  There is vomiting.  There is continued burning or discomfort with urination.

## 2016-12-21 NOTE — ED Notes (Signed)
Pt states she understands DC instructions. All questions and concerns answered. Pt home stable, am bulatory with steady gait.

## 2016-12-21 NOTE — ED Provider Notes (Signed)
[facility]-EMERGENCY DEPT Provider Note   CSN: [id number] Arrival date & time: 12/21/16  1125     History   Chief Complaint Chief Complaint  Patient presents with  . Pelvic Pain    HPI Tracy Graves is a 55 y.o. female.  The history is provided by the patient and medical records. No language interpreter was used.  Pelvic Pain  Associated symptoms include abdominal pain. Pertinent negatives include no headaches and no shortness of breath.   Tracy Graves is a 55 y.o. female  with a PMH of DM2, HTN, hypothyroidism who presents to the Emergency Department complaining of frequent urination and feeling as if she cannot empty her bladder since she awoke this morning. Denies dysuria or vaginal discharge. She is also having bilateral lower abdominal pain and notes that her urine was very dark today. She denies fevers, vomiting, back pain. She has taken no medications prior to arrival, but does state she has been trying to drink much more water today. She states that her blood sugars have been 90 to 130, she checks them 3 times a day. He tried to call her primary care doctor to be seen, but unfortunately the office was closed due to the holidays.   Past Medical History:  Diagnosis Date  . Abscess    chest wall, right breast  . Cellulitis   . Hyperglycemia due to type 2 diabetes mellitus (HCC) 06/05/2015  . Hypertension   . Hypothyroid    2016  . Intertrigo   . Morbid obesity due to excess calories (HCC) 06/05/2015  . Tobacco use     Patient Active Problem List   Diagnosis Date Noted  . Lactic acidosis 06/08/2015  . Hypothyroidism 06/07/2015  . Intertrigo 06/07/2015  . Abscess of chest wall   . Cellulitis of chest wall   . Dysarthria   . Hyperglycemia due to type 2 diabetes mellitus (HCC) 06/05/2015  . Skin abscess 06/05/2015  . Morbid obesity due to excess calories (HCC) 06/05/2015  . Tobacco abuse 06/05/2015  . Uncontrolled diabetes mellitus (HCC) 06/05/2015    History reviewed.  No pertinent surgical history.  OB History    No data available       Home Medications    Prior to Admission medications   Medication Sig Start Date End Date Taking? Authorizing Provider  cephALEXin (KEFLEX) 500 MG capsule Take 1 capsule (500 mg total) by mouth 4 (four) times daily. 12/21/16   Chase Picket Miko Sirico, PA-C  fluconazole (DIFLUCAN) 100 MG tablet Take 1 tablet (100 mg total) by mouth daily. 06/08/15   Gwenyth Bender, NP  glipiZIDE (GLUCOTROL) 5 MG tablet Take by mouth 2 (two) times daily before a meal.    Historical Provider, MD  HYDROcodone-acetaminophen (NORCO/VICODIN) 5-325 MG per tablet Take 1-2 tablets by mouth every 4 (four) hours as needed. 08/09/15   Ivery Quale, PA-C  levothyroxine (SYNTHROID, LEVOTHROID) 75 MCG tablet Take 1 tablet (75 mcg total) by mouth daily before breakfast. Patient taking differently: Take 100 mcg by mouth daily before breakfast.  06/08/15   Gwenyth Bender, NP  lisinopril (PRINIVIL,ZESTRIL) 5 MG tablet Take 5 mg by mouth daily.    Historical Provider, MD  metFORMIN (GLUCOPHAGE) 500 MG tablet Take by mouth 2 (two) times daily with a meal.    Historical Provider, MD  nicotine (NICODERM CQ - DOSED IN MG/24 HOURS) 21 mg/24hr patch Place 1 patch (21 mg total) onto the skin daily. 06/08/15   Gwenyth Bender, NP  nystatin (MYCOSTATIN/NYSTOP) 100000 UNIT/GM POWD Apply 1 Bottle topically 3 (three) times daily. 06/08/15   Gwenyth BenderKaren M Black, NP  ondansetron (ZOFRAN ODT) 4 MG disintegrating tablet Take 1 tablet (4 mg total) by mouth every 8 (eight) hours as needed for nausea or vomiting. 12/21/16   Chase PicketJaime Pilcher Maurene Hollin, PA-C  phenazopyridine (PYRIDIUM) 200 MG tablet Take 1 tablet (200 mg total) by mouth 3 (three) times daily as needed for pain. 12/21/16   Chase PicketJaime Pilcher Kirstine Jacquin, PA-C  traMADol (ULTRAM) 50 MG tablet Take 1 tablet (50 mg total) by mouth every 6 (six) hours as needed. 03/24/15   Earley FavorGail Schulz, NP    Family History No family history on file.  Social History Social  History  Substance Use Topics  . Smoking status: Current Every Day Smoker    Packs/day: 1.00    Types: Cigarettes  . Smokeless tobacco: Current User  . Alcohol use No     Allergies   Patient has no known allergies.   Review of Systems Review of Systems  Constitutional: Negative for chills and fever.  HENT: Negative for congestion.   Eyes: Negative for visual disturbance.  Respiratory: Negative for cough and shortness of breath.   Cardiovascular: Negative.   Gastrointestinal: Positive for abdominal pain and nausea. Negative for constipation, diarrhea and vomiting.  Genitourinary: Positive for frequency and pelvic pain. Negative for dysuria, vaginal bleeding, vaginal discharge and vaginal pain.  Musculoskeletal: Negative for back pain and neck pain.  Skin: Negative for rash.  Neurological: Negative for headaches.     Physical Exam Updated Vital Signs BP 130/86 (BP Location: Left Arm)   Pulse 83   Temp 97.9 F (36.6 C) (Oral)   Resp 17   SpO2 97%   Physical Exam  Constitutional: She is oriented to person, place, and time. She appears well-developed and well-nourished. No distress.  HENT:  Head: Normocephalic and atraumatic.  Cardiovascular: Normal rate, regular rhythm and normal heart sounds.   No murmur heard. Pulmonary/Chest: Effort normal and breath sounds normal. No respiratory distress.  Abdominal: Soft. She exhibits no distension. There is no rebound and no guarding.  No CVA tenderness. Mild lower abdominal tenderness with no focal areas TTP.   Musculoskeletal: She exhibits no edema.  Neurological: She is alert and oriented to person, place, and time.  Skin: Skin is warm and dry.  Nursing note and vitals reviewed.    ED Treatments / Results  Labs (all labs ordered are listed, but only abnormal results are displayed) Labs Reviewed  URINALYSIS, ROUTINE W REFLEX MICROSCOPIC - Abnormal; Notable for the following:       Result Value   APPearance CLOUDY (*)      Hgb urine dipstick LARGE (*)    Protein, ur 100 (*)    Leukocytes, UA LARGE (*)    Bacteria, UA RARE (*)    Squamous Epithelial / LPF 6-30 (*)    All other components within normal limits  CBG MONITORING, ED    EKG  EKG Interpretation None       Radiology No results found.  Procedures Procedures (including critical care time)  Medications Ordered in ED Medications  phenazopyridine (PYRIDIUM) tablet 200 mg (not administered)  ondansetron (ZOFRAN-ODT) disintegrating tablet 4 mg (4 mg Oral Given 12/21/16 1344)  cephALEXin (KEFLEX) capsule 500 mg (500 mg Oral Given 12/21/16 1346)     Initial Impression / Assessment and Plan / ED Course  I have reviewed the triage vital signs and the nursing notes.  Pertinent labs &  imaging results that were available during my care of the patient were reviewed by me and considered in my medical decision making (see chart for details).  Clinical Course    Donn PieriniLydia F Brugh is a 55 y.o. female who presents to ED for urinary frequency, dark urine and bilateral lower abdominal pain. She has nausea, but no vomiting. On exam, she is afebrile and nontoxic appearing with reassuring vital signs. She exhibits no CVA tenderness and has no peritoneal signs. Urine with large amount of leuks and TNTC white cells. Will treat with Keflex and send urine for culture. Pyridium and Zofran given for symptomatic control. Patient encouraged to follow up with primary care provider for recheck of symptoms in 1 week. Reasons to return sooner including fevers, back pain, no improvement despite appropriate antibiotic use in 3 days discussed bedside and patient verbalizes understanding and agreement with plan. All questions answered.  Final Clinical Impressions(s) / ED Diagnoses   Final diagnoses:  Acute cystitis with hematuria    New Prescriptions New Prescriptions   CEPHALEXIN (KEFLEX) 500 MG CAPSULE    Take 1 capsule (500 mg total) by mouth 4 (four) times daily.    ONDANSETRON (ZOFRAN ODT) 4 MG DISINTEGRATING TABLET    Take 1 tablet (4 mg total) by mouth every 8 (eight) hours as needed for nausea or vomiting.   PHENAZOPYRIDINE (PYRIDIUM) 200 MG TABLET    Take 1 tablet (200 mg total) by mouth 3 (three) times daily as needed for pain.     [facility]ime Pilcher Edelmira Gallogly, PA-C 12/21/16 1347    Gerhard Munchobert Lockwood, MD 12/21/16 [id number]

## 2016-12-23 ENCOUNTER — Inpatient Hospital Stay (HOSPITAL_COMMUNITY)
Admission: EM | Admit: 2016-12-23 | Discharge: 2016-12-24 | DRG: 690 | Disposition: A | Discharge status: Home / Self Care | Payer: Self-pay | Attending: Family Medicine | Admitting: Family Medicine

## 2016-12-23 ENCOUNTER — Emergency Department (HOSPITAL_COMMUNITY): Payer: Self-pay

## 2016-12-23 ENCOUNTER — Encounter (HOSPITAL_COMMUNITY): Payer: Self-pay | Admitting: *Deleted

## 2016-12-23 ENCOUNTER — Inpatient Hospital Stay (HOSPITAL_COMMUNITY): Payer: Self-pay

## 2016-12-23 DIAGNOSIS — F17210 Nicotine dependence, cigarettes, uncomplicated: Secondary | ICD-10-CM | POA: Diagnosis present

## 2016-12-23 DIAGNOSIS — N179 Acute kidney failure, unspecified: Secondary | ICD-10-CM | POA: Diagnosis present

## 2016-12-23 DIAGNOSIS — I700 Atherosclerosis of aorta: Secondary | ICD-10-CM | POA: Diagnosis present

## 2016-12-23 DIAGNOSIS — Z806 Family history of leukemia: Secondary | ICD-10-CM

## 2016-12-23 DIAGNOSIS — Z803 Family history of malignant neoplasm of breast: Secondary | ICD-10-CM

## 2016-12-23 DIAGNOSIS — N136 Pyonephrosis: Principal | ICD-10-CM | POA: Diagnosis present

## 2016-12-23 DIAGNOSIS — N1330 Unspecified hydronephrosis: Secondary | ICD-10-CM

## 2016-12-23 DIAGNOSIS — E039 Hypothyroidism, unspecified: Secondary | ICD-10-CM | POA: Diagnosis present

## 2016-12-23 DIAGNOSIS — Z7984 Long term (current) use of oral hypoglycemic drugs: Secondary | ICD-10-CM

## 2016-12-23 DIAGNOSIS — N10 Acute tubulo-interstitial nephritis: Secondary | ICD-10-CM

## 2016-12-23 DIAGNOSIS — K5900 Constipation, unspecified: Secondary | ICD-10-CM | POA: Diagnosis present

## 2016-12-23 DIAGNOSIS — E1159 Type 2 diabetes mellitus with other circulatory complications: Secondary | ICD-10-CM | POA: Diagnosis present

## 2016-12-23 DIAGNOSIS — R319 Hematuria, unspecified: Secondary | ICD-10-CM

## 2016-12-23 DIAGNOSIS — Z8041 Family history of malignant neoplasm of ovary: Secondary | ICD-10-CM

## 2016-12-23 DIAGNOSIS — E871 Hypo-osmolality and hyponatremia: Secondary | ICD-10-CM | POA: Diagnosis present

## 2016-12-23 DIAGNOSIS — N200 Calculus of kidney: Secondary | ICD-10-CM

## 2016-12-23 DIAGNOSIS — E119 Type 2 diabetes mellitus without complications: Secondary | ICD-10-CM | POA: Diagnosis present

## 2016-12-23 DIAGNOSIS — N132 Hydronephrosis with renal and ureteral calculous obstruction: Secondary | ICD-10-CM

## 2016-12-23 DIAGNOSIS — N390 Urinary tract infection, site not specified: Secondary | ICD-10-CM | POA: Diagnosis present

## 2016-12-23 DIAGNOSIS — I10 Essential (primary) hypertension: Secondary | ICD-10-CM | POA: Diagnosis present

## 2016-12-23 HISTORY — PX: IR GENERIC HISTORICAL: IMG1180011

## 2016-12-23 HISTORY — PX: NEPHROSTOMY TUBE PLACEMENT (ARMC HX): HXRAD1726

## 2016-12-23 LAB — URINALYSIS, ROUTINE W REFLEX MICROSCOPIC
BILIRUBIN URINE: NEGATIVE
GLUCOSE, UA: NEGATIVE mg/dL
Ketones, ur: 15 mg/dL — AB
Nitrite: POSITIVE — AB
PROTEIN: 100 mg/dL — AB
Specific Gravity, Urine: 1.02 (ref 1.005–1.030)
pH: 6 (ref 5.0–8.0)

## 2016-12-23 LAB — COMPREHENSIVE METABOLIC PANEL
ALK PHOS: 100 U/L (ref 38–126)
ALT: 16 U/L (ref 14–54)
AST: 15 U/L (ref 15–41)
Albumin: 4.1 g/dL (ref 3.5–5.0)
Anion gap: 10 (ref 5–15)
BUN: 11 mg/dL (ref 6–20)
CALCIUM: 10.6 mg/dL — AB (ref 8.9–10.3)
CHLORIDE: 102 mmol/L (ref 101–111)
CO2: 21 mmol/L — ABNORMAL LOW (ref 22–32)
CREATININE: 1.38 mg/dL — AB (ref 0.44–1.00)
GFR calc Af Amer: 49 mL/min — ABNORMAL LOW (ref >60)
GFR, EST NON AFRICAN AMERICAN: 42 mL/min — AB (ref >60)
Glucose, Bld: 123 mg/dL — ABNORMAL HIGH (ref 65–99)
Potassium: 4.2 mmol/L (ref 3.5–5.1)
Sodium: 133 mmol/L — ABNORMAL LOW (ref 135–145)
Total Bilirubin: 0.9 mg/dL (ref 0.3–1.2)
Total Protein: 7.2 g/dL (ref 6.5–8.1)

## 2016-12-23 LAB — URINALYSIS, MICROSCOPIC (REFLEX): WBC, UA: NONE SEEN WBC/hpf (ref 0–5)

## 2016-12-23 LAB — CBC
HCT: 43.8 % (ref 36.0–46.0)
Hemoglobin: 15.3 g/dL — ABNORMAL HIGH (ref 12.0–15.0)
MCH: 31.5 pg (ref 26.0–34.0)
MCHC: 34.9 g/dL (ref 30.0–36.0)
MCV: 90.1 fL (ref 78.0–100.0)
Platelets: 480 10*3/uL — ABNORMAL HIGH (ref 150–400)
RBC: 4.86 MIL/uL (ref 3.87–5.11)
RDW: 13.3 % (ref 11.5–15.5)
WBC: 15.1 10*3/uL — ABNORMAL HIGH (ref 4.0–10.5)

## 2016-12-23 LAB — GLUCOSE, CAPILLARY
GLUCOSE-CAPILLARY: 94 mg/dL (ref 65–99)
GLUCOSE-CAPILLARY: 118 mg/dL — AB (ref 65–99)
Glucose-Capillary: 129 mg/dL — ABNORMAL HIGH (ref 65–99)
Glucose-Capillary: 104 mg/dL — ABNORMAL HIGH (ref 65–99)

## 2016-12-23 LAB — LIPASE, BLOOD: LIPASE: 20 U/L (ref 11–51)

## 2016-12-23 LAB — LACTIC ACID, PLASMA
LACTIC ACID, VENOUS: 1.1 mmol/L (ref 0.5–1.9)
Lactic Acid, Venous: 1.2 mmol/L (ref 0.5–1.9)

## 2016-12-23 LAB — POC URINE PREG, ED: Preg Test, Ur: NEGATIVE

## 2016-12-23 LAB — PROTIME-INR
INR: 1
PROTHROMBIN TIME: 13.2 s (ref 11.4–15.2)

## 2016-12-23 LAB — APTT: APTT: 33 s (ref 24–36)

## 2016-12-23 MED ORDER — ONDANSETRON HCL 4 MG/2ML IJ SOLN: 4.0000 mg | Freq: Four times a day (QID) | INTRAMUSCULAR | Status: DC | PRN

## 2016-12-23 MED ORDER — LIDOCAINE HCL (PF) 1 % IJ SOLN
INTRAMUSCULAR | Status: AC
  Filled 2016-12-23: qty 30

## 2016-12-23 MED ORDER — ONDANSETRON HCL 4 MG/2ML IJ SOLN
4.0000 mg | Freq: Once | INTRAMUSCULAR | Status: AC
  Administered 2016-12-23: 4 mg via INTRAVENOUS
  Filled 2016-12-23: qty 2

## 2016-12-23 MED ORDER — IOPAMIDOL (ISOVUE-300) INJECTION 61%
INTRAVENOUS | Status: AC
  Administered 2016-12-23: 10 mL
  Filled 2016-12-23: qty 50

## 2016-12-23 MED ORDER — INSULIN ASPART 100 UNIT/ML ~~LOC~~ SOLN: 0.0000 [IU] | Freq: Three times a day (TID) | SUBCUTANEOUS | Status: DC

## 2016-12-23 MED ORDER — INSULIN ASPART 100 UNIT/ML ~~LOC~~ SOLN: 0.0000 [IU] | Freq: Four times a day (QID) | SUBCUTANEOUS | Status: DC

## 2016-12-23 MED ORDER — ACETAMINOPHEN 650 MG RE SUPP: 650.0000 mg | Freq: Four times a day (QID) | RECTAL | Status: DC | PRN

## 2016-12-23 MED ORDER — FENTANYL CITRATE (PF) 100 MCG/2ML IJ SOLN
INTRAMUSCULAR | Status: AC
  Filled 2016-12-23: qty 2

## 2016-12-23 MED ORDER — MIDAZOLAM HCL 2 MG/2ML IJ SOLN
INTRAMUSCULAR | Status: AC
  Filled 2016-12-23: qty 2

## 2016-12-23 MED ORDER — POLYETHYLENE GLYCOL 3350 17 G PO PACK
17.0000 g | PACK | Freq: Every day | ORAL | Status: DC | PRN
  Administered 2016-12-23: 17 g via ORAL
  Filled 2016-12-23: qty 1

## 2016-12-23 MED ORDER — FENTANYL CITRATE (PF) 100 MCG/2ML IJ SOLN
INTRAMUSCULAR | Status: AC | PRN
  Administered 2016-12-23: 50 ug via INTRAVENOUS

## 2016-12-23 MED ORDER — INSULIN ASPART 100 UNIT/ML ~~LOC~~ SOLN: 0.0000 [IU] | Freq: Every day | SUBCUTANEOUS | Status: DC

## 2016-12-23 MED ORDER — SODIUM CHLORIDE 0.9 % IV SOLN
INTRAVENOUS | Status: AC
  Administered 2016-12-23: 05:00:00 via INTRAVENOUS

## 2016-12-23 MED ORDER — LIDOCAINE HCL (PF) 1 % IJ SOLN
INTRAMUSCULAR | Status: AC | PRN
  Administered 2016-12-23: 5 mL

## 2016-12-23 MED ORDER — LIDOCAINE HCL (PF) 1 % IJ SOLN
INTRAMUSCULAR | Status: AC
  Filled 2016-12-23: qty 10

## 2016-12-23 MED ORDER — DEXTROSE 5 % IV SOLN
1.0000 g | INTRAVENOUS | Status: DC
  Administered 2016-12-24: 1 g via INTRAVENOUS
  Filled 2016-12-23: qty 10

## 2016-12-23 MED ORDER — DEXTROSE 5 % IV SOLN
1.0000 g | Freq: Once | INTRAVENOUS | Status: AC
  Administered 2016-12-23: 1 g via INTRAVENOUS
  Filled 2016-12-23: qty 10

## 2016-12-23 MED ORDER — LEVOTHYROXINE SODIUM 112 MCG PO TABS
112.0000 ug | ORAL_TABLET | Freq: Every day | ORAL | Status: DC
  Administered 2016-12-23 – 2016-12-24 (×2): 112 ug via ORAL
  Filled 2016-12-23 (×2): qty 1

## 2016-12-23 MED ORDER — NICOTINE 14 MG/24HR TD PT24
14.0000 mg | MEDICATED_PATCH | Freq: Every day | TRANSDERMAL | Status: DC
  Administered 2016-12-23 – 2016-12-24 (×2): 14 mg via TRANSDERMAL
  Filled 2016-12-23 (×2): qty 1

## 2016-12-23 MED ORDER — SODIUM CHLORIDE 0.9 % IV SOLN
INTRAVENOUS | Status: DC
Start: 2016-12-23 — End: 2016-12-24
  Administered 2016-12-23 – 2016-12-24 (×3): via INTRAVENOUS

## 2016-12-23 MED ORDER — ACETAMINOPHEN 325 MG PO TABS
650.0000 mg | ORAL_TABLET | Freq: Four times a day (QID) | ORAL | Status: DC | PRN
  Administered 2016-12-23 (×2): 650 mg via ORAL
  Filled 2016-12-23 (×2): qty 2

## 2016-12-23 MED ORDER — ONDANSETRON HCL 4 MG PO TABS: 4.0000 mg | ORAL_TABLET | Freq: Four times a day (QID) | ORAL | Status: DC | PRN

## 2016-12-23 MED ORDER — SODIUM CHLORIDE 0.9 % IV BOLUS (SEPSIS)
1000.0000 mL | Freq: Once | INTRAVENOUS | Status: AC
  Administered 2016-12-23: 1000 mL via INTRAVENOUS

## 2016-12-23 MED ORDER — ONDANSETRON HCL 4 MG/2ML IJ SOLN
INTRAMUSCULAR | Status: AC | PRN
  Administered 2016-12-23: 4 mg via INTRAVENOUS

## 2016-12-23 MED ORDER — MIDAZOLAM HCL 2 MG/2ML IJ SOLN
INTRAMUSCULAR | Status: AC | PRN
  Administered 2016-12-23: 1 mg via INTRAVENOUS

## 2016-12-23 MED ORDER — ONDANSETRON HCL 4 MG/2ML IJ SOLN
INTRAMUSCULAR | Status: AC
  Filled 2016-12-23: qty 2

## 2016-12-23 MED ORDER — OXYCODONE HCL 5 MG PO TABS: 5.0000 mg | ORAL_TABLET | ORAL | Status: DC | PRN

## 2016-12-23 NOTE — Progress Notes (Signed)
Pharmacy Antibiotic Note  Donn PieriniLydia F Majchrzak is a 55 y.o. female admitted on 12/23/2016 with UTI.  Pharmacy has been consulted for Ceftriaxone dosing.  Plan: -Ceftriaxone 1g IV q24h -F/U urine culture   Temp (24hrs), Avg:98.1 F (36.7 C), Min:98.1 F (36.7 C), Max:98.1 F (36.7 C)   Recent Labs Lab 12/23/16 0109  WBC 15.1*  CREATININE 1.38*    CrCl cannot be calculated (Unknown ideal weight.).    No Known Allergies   Abran DukeLedford, Alexianna Nachreiner 12/23/2016 6:23 AM

## 2016-12-23 NOTE — ED Notes (Signed)
Pt states she was here for a UTI Friday evening and was told that if anything changed or she started to vomit to come back. Pt states she has been vomiting all day today.

## 2016-12-23 NOTE — ED Notes (Signed)
Patient to CT.

## 2016-12-23 NOTE — ED Triage Notes (Signed)
The pt is c/o abd pain and emesis  She was in her last pm and diagnosed with a uti

## 2016-12-23 NOTE — H&P (Signed)
History and Physical  Patient Name: Tracy PieriniLydia F Moffa     ZOX:[id number]    DOB: 1961/09/08    DOA: 12/23/2016 PCP: Jerrell BelfastHouse, [name], FNP   Patient coming from: Home  Chief Complaint: Vomiting  HPI: Tracy Graves is a 55 y.o. female with a past medical history significant for NIDDM, hypothyroidism and HTN who presents with UTI and now vomiting.  The patient was in her usual state of health until 2 days ago, Friday when she woke up with typical irritative voiding symptoms like having to urinate repeatedly, dark urine, pain with urination.  She was seen in the ER, had normal vital signs, no back pain, UA with WBC and RBC TNTC and was discharged with pyridium and cephalexin and ondansetron.   Initially she felt better, then Saturday afternoon she had gradual onset nausea, then repeated non-bloody emesis >6 times so she came to the ER.  ED course: -Afebrile, heart rate 94, respirations and pulse ox normal, blood pressure 146/80 -Na 133, K 4.2, Cr 1.38 (baseline 0.7), WBC 15.1 K, Hgb 13.3 -LFTs and lipase normal -Pregnancy test negative -Urinalysis with again leukocytes, nitrites -CT renal protocol was obtained which showed acute left hydronephrosis with a 2.4 cm stone -The case is discussed with urology on-call, who recommended IR consultation for nephrostomy tube -Urine was sent for culture, she was given ceftriaxone before blood cultures were obtained, and TRH were asked to evaluate     ROS: ROS        Past Medical History:  Diagnosis Date  . Abscess    chest wall, right breast  . Cellulitis   . Hyperglycemia due to type 2 diabetes mellitus (HCC) 06/05/2015  . Hypertension   . Hypothyroid    2016  . Intertrigo   . Morbid obesity due to excess calories (HCC) 06/05/2015  . Tobacco use     History reviewed. No pertinent surgical history.  Social History: Patient lives with her boyfriend.  The patient walks unassisted.  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  Currently smokes daily.   No Known Allergies  Family history: family history includes Breast cancer in her mother; Leukemia in her maternal grandfather; Ovarian cancer in her maternal aunt.  Prior to Admission medications   Medication Sig Start Date End Date Taking? Authorizing Provider  levothyroxine (SYNTHROID, LEVOTHROID) 112 MCG tablet Take 112 mcg by mouth daily before breakfast.    Historical Provider, MD  lisinopril (PRINIVIL,ZESTRIL) 10 MG tablet Take 10 mg by mouth daily.     Historical Provider, MD  metFORMIN (GLUCOPHAGE) 500 MG tablet Take by mouth 2 (two) times daily with a meal.    Historical Provider, MD  nicotine (NICODERM CQ - DOSED IN MG/24 HOURS) 21 mg/24hr patch Place 1 patch (21 mg total) onto the skin daily. Patient not taking: Reported on 12/21/2016 06/08/15   Gwenyth BenderKaren M Black, NP       Physical Exam: BP 138/87   Pulse 79   Temp 98.1 F (36.7 C) (Oral)   Resp 15   SpO2 96%  General appearance: Well-developed, obese adult female, alert and in no acute distress, appears comfortable, worried.   Eyes: Anicteric, conjunctiva pink, lids and lashes normal. PERRL.    ENT: No nasal deformity, discharge, epistaxis.  Hearing normal. OP moist without lesions.   Neck: No neck masses.  Trachea midline.  No thyromegaly/tenderness. Lymph: No cervical or supraclavicular lymphadenopathy. Skin: Warm and dry.  No jaundice.  No suspicious rashes or lesions. Cardiac: RRR, nl S1-S2, no murmurs appreciated.  Capillary refill is brisk.  JVP not visible.  No LE edema.  Radial and DP pulses 2+ and symmetric. Respiratory: Normal respiratory rate and rhythm.  CTAB without rales or wheezes. Abdomen: Abdomen soft.  Minimal nonfocal TTP without guarding. No ascites, distension, hepatosplenomegaly.  No CVA tenderness at all. MSK: No deformities or effusions.  No cyanosis or clubbing. Neuro: Cranial nerves normal.  Sensation intact to light touch. Speech is fluent.  Muscle strength  normal.    Psych: Sensorium intact and responding to questions, attention normal.  Behavior appropriate.  Affect normal.  Judgment and insight appear normal.     Labs on Admission:  I have personally reviewed following labs and imaging studies: CBC:  Recent Labs Lab 12/23/16 0109  WBC 15.1*  HGB 15.3*  HCT 43.8  MCV 90.1  PLT 480*   Basic Metabolic Panel:  Recent Labs Lab 12/23/16 0109  NA 133*  K 4.2  CL 102  CO2 21*  GLUCOSE 123*  BUN 11  CREATININE 1.38*  CALCIUM 10.6*   GFR: CrCl cannot be calculated (Unknown ideal weight.).  Liver Function Tests:  Recent Labs Lab 12/23/16 0109  AST 15  ALT 16  ALKPHOS 100  BILITOT 0.9  PROT 7.2  ALBUMIN 4.1    Recent Labs Lab 12/23/16 0109  LIPASE 20   No results for input(s): AMMONIA in the last 168 hours. Coagulation Profile: No results for input(s): INR, PROTIME in the last 168 hours. Cardiac Enzymes: No results for input(s): CKTOTAL, CKMB, CKMBINDEX, TROPONINI in the last 168 hours. BNP (last 3 results) No results for input(s): PROBNP in the last 8760 hours. HbA1C: No results for input(s): HGBA1C in the last 72 hours. CBG:  Recent Labs Lab 12/21/16 1206  GLUCAP 96   Lipid Profile: No results for input(s): CHOL, HDL, LDLCALC, TRIG, CHOLHDL, LDLDIRECT in the last 72 hours. Thyroid Function Tests: No results for input(s): TSH, T4TOTAL, FREET4, T3FREE, THYROIDAB in the last 72 hours. Anemia Panel: No results for input(s): VITAMINB12, FOLATE, FERRITIN, TIBC, IRON, RETICCTPCT in the last 72 hours. Sepsis Labs: Lactate pending Invalid input(s): PROCALCITONIN, LACTICIDVEN No results found for this or any previous visit (from the past 240 hour(s)).       Radiological Exams on Admission: Personally reviewed CT renal report: Ct Renal Stone Study  Result Date: 12/23/2016 CLINICAL DATA:  Flank pain and UTI. EXAM: CT ABDOMEN AND PELVIS WITHOUT CONTRAST TECHNIQUE: Multidetector CT imaging of the abdomen  and pelvis was performed following the standard protocol without IV contrast. COMPARISON:  None. FINDINGS: Lower chest: No pulmonary nodules or pleural effusion. No visible pericardial effusion. Hepatobiliary: There is cholelithiasis without evidence of acute cholecystitis. No biliary dilatation. Hepatic size and contours are normal. No focal liver lesions. Pancreas: Normal noncontrast appearance of the pancreas. No peripancreatic fluid collection. Spleen: Normal. Adrenal glands: Normal. Urinary Tract: --Right kidney: No hydronephrosis or perinephric stranding. No nephrolithiasis. No obstructing ureteral stones. --Left kidney: There is severe left hydronephrosis. There is a 2.4 cm stone within the proximal left ureter, just distal to the ureteropelvic junction. More proximally in the left ureter, there are stone fragments that measure up to 4 mm. There are multiple calcified foci within the left renal pelvis, with the largest measuring 1.6 cm. There is mild perinephric stranding. --Urinary bladder: Urinary bladder is nondistended. Stomach/Bowel: There is sigmoid diverticulosis without acute inflammation. No evidence of acute colitis or enteritis. No small bowel obstruction.  The appendix is normal. No abdominal fluid collection. No portal venous gas. Vascular/Lymphatic: There is atherosclerotic calcification of the non aneurysmal abdominal aorta. No abdominal or pelvic lymphadenopathy. Reproductive: There is a lobulated contour of the uterus, consistent with multiple fibroids. Ovaries are not clearly visualized. No free fluid in the pelvis Musculoskeletal. Moderate L5-S1 facet arthrosis. No bony spinal canal stenosis. No lytic or blastic lesions. IMPRESSION: 1. Acute left-sided obstructive uropathy with large, 2.4 cm ureteral stone just distal to the left ureteropelvic junction causing severe left hydronephrosis. 2. Multiple large stones and/or stone fragments within the left renal pelvis, measuring up to 1.6 cm. 3.  Aortic atherosclerosis. 4. Cholelithiasis without acute cholecystitis and sigmoid diverticulosis without acute diverticulitis. 5. Multilobulated appearance of the uterus, likely multiple fibroids. Electronically Signed   By: Deatra Robinson M.D.   On: 12/23/2016 04:11        Assessment/Plan Principal Problem:   Hydronephrosis Active Problems:   Morbid obesity due to excess calories (HCC)   Type 2 diabetes mellitus without complication, without long-term current use of insulin (HCC)   Hypothyroidism, acquired   UTI (urinary tract infection)   AKI (acute kidney injury) (HCC)   Essential hypertension   Hyponatremia  1. Hydronephrosis and UTI, possible sepsis:  Meets SIRS criteria, has AKI from hydronephrosis, UTI.   Currently hemodynamically stable and appears relatively comfortable. -Consult to IR for nephrostomy tube -NPO and MIVF -Ceftriaxone IV per pharmacy -Acetaminophen or oxycodone for pain, ondanstron for nausea -Follow urine culture -Obtain blood culture -Obtain lactic acid   2. Acute kidney injury:  In setting of pyelo and obstruction. -Fluids and trend BMP -Nephrostomy tube and antibiotics -Hold metformin and ACEi  3. Hyponatremia:  Mild, in setting of illness. -Fluids and trend BMP  4. HTN:  -Hold lisinopril for now  5. NIDDM:  -Hold metformin -SSI with meals  6. Hypothyroidism:  -Continue levothyroxine  7. Smoking:  -Nicotine patch -Smoking cessation encouraged     DVT prophylaxis: SCDs  Code Status: FULL  Family Communication: Boyfriend at bedside.  Disposition Plan: Anticipate IV fluids, IV antibiotics and IR drain today.   Consults called: Urology, IR consulted via Epic Admission status: INPATIENT    Medical decision making: Patient seen at 6:10 AM on 12/23/2016.  The patient was discussed with Dr. Manus Gunning.  What exists of the patient's chart was reviewed in depth and summarized above.  Clinical condition: stable.        Alberteen Sam [facility] Pager [phone]      At the time of admission, it appears that the appropriate admission status for this patient is INPATIENT. This is judged to be reasonable and necessary in order to provide the required intensity of service to ensure the patient's safety given the presenting symptoms, physical exam findings, and initial radiographic and laboratory data in the context of their chronic comorbidities.  Together, these circumstances are felt to place her at high risk for further clinical deterioration threatening life, limb, or organ.   Patient requires inpatient status due to high intensity of service, high risk for further deterioration and high frequency of surveillance required because of this acute illness that poses a threat to life, limb or bodily function from urinary obstruction, acute kidney injury and possible sepsis.  I certify that at the point of admission it is my clinical judgment that the patient will require inpatient hospital care spanning beyond 2 midnights from the point of admission and that early discharge would result in unnecessary risk  of decompensation and readmission or threat to life, limb or bodily function.

## 2016-12-23 NOTE — Consult Note (Signed)
Chief Complaint: Patient was seen in consultation today for left percutaneous nephrostomy placement Chief Complaint  Patient presents with  . Emesis   at the request of Dr Dub AmisB Herrick  Referring Physician(s): Dr Dub AmisB Herrick  Supervising Physician: Ruel FavorsShick, Trevor  [facility]  History of Present Illness: Tracy Graves is a 55 y.o. female   Several days of N/V Left sided groin pain Worsening for dew days Was treated for UTI- but symptoms persist Presented to ED CT: IMPRESSION: 1. Acute left-sided obstructive uropathy with large, 2.4 cm ureteral stone just distal to the left ureteropelvic junction causing severe left hydronephrosis. 2. Multiple large stones and/or stone fragments within the left renal pelvis, measuring up to 1.6 cm. 3. Aortic atherosclerosis. 4. Cholelithiasis without acute cholecystitis and sigmoid diverticulosis without acute diverticulitis. 5. Multilobulated appearance of the uterus, likely multiple Fibroids.  Now request per Urology for Left percutaneous nephrostomy placement Dr Miles CostainShick has reviewed imaging and approves procedure  Past Medical History:  Diagnosis Date  . Abscess    chest wall, right breast  . Cellulitis   . Hyperglycemia due to type 2 diabetes mellitus (HCC) 06/05/2015  . Hypertension   . Hypothyroid    2016  . Intertrigo   . Morbid obesity due to excess calories (HCC) 06/05/2015  . Tobacco use     History reviewed. No pertinent surgical history.  Allergies: Patient has no known allergies.  Medications: Prior to Admission medications   Medication Sig Start Date End Date Taking? Authorizing Provider  levothyroxine (SYNTHROID, LEVOTHROID) 112 MCG tablet Take 112 mcg by mouth daily before breakfast.    Historical Provider, MD  lisinopril (PRINIVIL,ZESTRIL) 10 MG tablet Take 10 mg by mouth daily.     Historical Provider, MD  metFORMIN (GLUCOPHAGE) 500 MG tablet Take by mouth 2 (two) times daily with a meal.     Historical Provider, MD  nicotine (NICODERM CQ - DOSED IN MG/24 HOURS) 21 mg/24hr patch Place 1 patch (21 mg total) onto the skin daily. Patient not taking: Reported on 12/21/2016 06/08/15   Gwenyth BenderKaren M Black, NP     Family History  Problem Relation Age of Onset  . Breast cancer Mother   . Leukemia Maternal Grandfather   . Ovarian cancer Maternal Aunt     Social History   Social History  . Marital status: Single    Spouse name: N/A  . Number of children: N/A  . Years of education: N/A   Social History Main Topics  . Smoking status: Current Every Day Smoker    Packs/day: 1.00    Types: Cigarettes  . Smokeless tobacco: Current User  . Alcohol use No  . Drug use: No  . Sexual activity: Yes    Birth control/ protection: None   Other Topics Concern  . None   Social History Narrative  . None    Review of Systems: A 12 point ROS discussed and pertinent positives are indicated in the HPI above.  All other systems are negative.  Review of Systems  Constitutional: Positive for activity change and appetite change. Negative for fever.  Gastrointestinal: Positive for abdominal pain, nausea and vomiting.  Genitourinary: Positive for difficulty urinating and dysuria.  Psychiatric/Behavioral: Negative for behavioral problems and confusion.    Vital Signs: BP 138/81 (BP Location: Left Arm)   Pulse 76   Temp 97.8 F (36.6 C) (Oral)   Resp 21   SpO2 96%   Physical Exam  Constitutional: She is oriented  to person, place, and time.  Cardiovascular: Normal rate, regular rhythm and normal heart sounds.   Pulmonary/Chest: Effort normal and breath sounds normal.  Abdominal: Soft. Bowel sounds are normal.  Musculoskeletal: Normal range of motion.  Neurological: She is alert and oriented to person, place, and time.  Skin: Skin is warm.  Psychiatric: She has a normal mood and affect. Her behavior is normal. Judgment and thought content normal.  Nursing note and vitals  reviewed.   Mallampati Score:  MD Evaluation Airway: WNL Heart: WNL Abdomen: WNL Chest/ Lungs: WNL ASA  Classification: 2 Mallampati/Airway Score: Two  Imaging: Ct Renal Stone Study  Result Date: 12/23/2016 CLINICAL DATA:  Flank pain and UTI. EXAM: CT ABDOMEN AND PELVIS WITHOUT CONTRAST TECHNIQUE: Multidetector CT imaging of the abdomen and pelvis was performed following the standard protocol without IV contrast. COMPARISON:  None. FINDINGS: Lower chest: No pulmonary nodules or pleural effusion. No visible pericardial effusion. Hepatobiliary: There is cholelithiasis without evidence of acute cholecystitis. No biliary dilatation. Hepatic size and contours are normal. No focal liver lesions. Pancreas: Normal noncontrast appearance of the pancreas. No peripancreatic fluid collection. Spleen: Normal. Adrenal glands: Normal. Urinary Tract: --Right kidney: No hydronephrosis or perinephric stranding. No nephrolithiasis. No obstructing ureteral stones. --Left kidney: There is severe left hydronephrosis. There is a 2.4 cm stone within the proximal left ureter, just distal to the ureteropelvic junction. More proximally in the left ureter, there are stone fragments that measure up to 4 mm. There are multiple calcified foci within the left renal pelvis, with the largest measuring 1.6 cm. There is mild perinephric stranding. --Urinary bladder: Urinary bladder is nondistended. Stomach/Bowel: There is sigmoid diverticulosis without acute inflammation. No evidence of acute colitis or enteritis. No small bowel obstruction. The appendix is normal. No abdominal fluid collection. No portal venous gas. Vascular/Lymphatic: There is atherosclerotic calcification of the non aneurysmal abdominal aorta. No abdominal or pelvic lymphadenopathy. Reproductive: There is a lobulated contour of the uterus, consistent with multiple fibroids. Ovaries are not clearly visualized. No free fluid in the pelvis Musculoskeletal. Moderate  L5-S1 facet arthrosis. No bony spinal canal stenosis. No lytic or blastic lesions. IMPRESSION: 1. Acute left-sided obstructive uropathy with large, 2.4 cm ureteral stone just distal to the left ureteropelvic junction causing severe left hydronephrosis. 2. Multiple large stones and/or stone fragments within the left renal pelvis, measuring up to 1.6 cm. 3. Aortic atherosclerosis. 4. Cholelithiasis without acute cholecystitis and sigmoid diverticulosis without acute diverticulitis. 5. Multilobulated appearance of the uterus, likely multiple fibroids. Electronically Signed   By: Deatra Robinson M.D.   On: 12/23/2016 04:11    Labs:  CBC:  Recent Labs  12/23/16 0109  WBC 15.1*  HGB 15.3*  HCT 43.8  PLT 480*    COAGS: No results for input(s): INR, APTT in the last 8760 hours.  BMP:  Recent Labs  12/23/16 0109  NA 133*  K 4.2  CL 102  CO2 21*  GLUCOSE 123*  BUN 11  CALCIUM 10.6*  CREATININE 1.38*  GFRNONAA 42*  GFRAA 49*    LIVER FUNCTION TESTS:  Recent Labs  12/23/16 0109  BILITOT 0.9  AST 15  ALT 16  ALKPHOS 100  PROT 7.2  ALBUMIN 4.1    TUMOR MARKERS: No results for input(s): AFPTM, CEA, CA199, CHROMGRNA in the last 8760 hours.  Assessment and Plan:  Left Hydronephrosis Left renal stone Scheduled for L PCN in IR today Risks and Benefits discussed with the patient including, but not limited to infection, bleeding,  significant bleeding causing loss or decrease in renal function or damage to adjacent structures.  All of the patient's questions were answered, patient is agreeable to proceed. Consent signed and in chart.   Thank you for this interesting consult.  I greatly enjoyed meeting Tracy Graves and look forward to participating in their care.  A copy of this report was sent to the requesting provider on this date.  Electronically Signed: Jailah Willis A 12/23/2016, 8:40 AM   I spent a total of 40 Minutes    in face to face in clinical consultation,  greater than 50% of which was counseling/coordinating care for L PCN

## 2016-12-23 NOTE — Procedures (Signed)
S/p LT 10 FR PCN  No comp Stable To gravity bag Full report in PACS

## 2016-12-23 NOTE — Consult Note (Signed)
I have been asked to see the patient by Dr. Silas SacramentoStephen Rancor, for evaluation and management of left obstructing stones and evidence of infection.Marland Kitchen.  History of present illness: This 55 year old female who is presented to the emergency department today with several days of nausea, vomiting, and left groin pain. She was seen 3 days prior and treated for urinary tract infection. Her symptoms progressed with worsening nausea and vomiting. She denies any fevers or flank pain. She's not had any gross hematuria. She has had urinary frequency and urgency but no dysuria. The patient states that her pain radiates down into her vagina. In the emergency department she was noted to have an elevated white blood cell count of 15.1K with a GFR of 49, baseline is greater than 60.  A CT scan of the abdomen and pelvis demonstrated a 2.4 centimeter left UPJ stone with several large nonobstructing stones in the left kidney, the largest one measuring 1.6 centimeters. There is associated hydronephrosis.      The patient's past medical history is consistent with diabetes, well-controlled present history. She also has a history of tobacco abuse. She has no history of heart attack or stroke. She has no associated manifestations of her diabetes.  Review of systems: A 12 point comprehensive review of systems was obtained and is negative unless otherwise stated in the history of present illness.  Patient Active Problem List   Diagnosis Date Noted  . Hydronephrosis 12/23/2016  . UTI (urinary tract infection) 12/23/2016  . AKI (acute kidney injury) (HCC) 12/23/2016  . Essential hypertension 12/23/2016  . Hyponatremia 12/23/2016  . Hypothyroidism, acquired 06/07/2015  . Abscess of chest wall   . Morbid obesity due to excess calories (HCC) 06/05/2015  . Tobacco abuse 06/05/2015  . Type 2 diabetes mellitus without complication, without long-term current use of insulin (HCC) 06/05/2015    No current facility-administered  medications on file prior to encounter.    Current Outpatient Prescriptions on File Prior to Encounter  Medication Sig Dispense Refill  . levothyroxine (SYNTHROID, LEVOTHROID) 112 MCG tablet Take 112 mcg by mouth daily before breakfast.    . lisinopril (PRINIVIL,ZESTRIL) 10 MG tablet Take 10 mg by mouth daily.     . metFORMIN (GLUCOPHAGE) 500 MG tablet Take by mouth 2 (two) times daily with a meal.    . nicotine (NICODERM CQ - DOSED IN MG/24 HOURS) 21 mg/24hr patch Place 1 patch (21 mg total) onto the skin daily. (Patient not taking: Reported on 12/21/2016) 28 patch 0    Past Medical History:  Diagnosis Date  . Abscess    chest wall, right breast  . Cellulitis   . Hyperglycemia due to type 2 diabetes mellitus (HCC) 06/05/2015  . Hypertension   . Hypothyroid    2016  . Intertrigo   . Morbid obesity due to excess calories (HCC) 06/05/2015  . Tobacco use     History reviewed. No pertinent surgical history.  Social History  Substance Use Topics  . Smoking status: Current Every Day Smoker    Packs/day: 1.00    Types: Cigarettes  . Smokeless tobacco: Current User  . Alcohol use No    Family History  Problem Relation Age of Onset  . Breast cancer Mother   . Leukemia Maternal Grandfather   . Ovarian cancer Maternal Aunt     PE: Vitals:   12/23/16 0430 12/23/16 0506 12/23/16 0530 12/23/16 0600  BP: 139/86 138/87 139/90 136/84  Pulse: 82 79 83 77  Resp: 18 15 24  21  Temp:      TempSrc:      SpO2: 96% 96% 96% 95%   Patient appears to be in no acute distress  patient is alert and oriented x3 Atraumatic normocephalic head No cervical or supraclavicular lymphadenopathy appreciated No increased work of breathing, no audible wheezes/rhonchi Regular sinus rhythm/rate Abdomen is soft, nontender, nondistended, minimally tender in her left CVA Lower extremities are symmetric without appreciable edema Grossly neurologically intact No identifiable skin lesions   Recent Labs   12/23/16 0109  WBC 15.1*  HGB 15.3*  HCT 43.8    Recent Labs  12/23/16 0109  NA 133*  K 4.2  CL 102  CO2 21*  GLUCOSE 123*  BUN 11  CREATININE 1.38*  CALCIUM 10.6*   No results for input(s): LABPT, INR in the last 72 hours. No results for input(s): LABURIN in the last 72 hours. Results for orders placed or performed during the hospital encounter of 08/08/15  Culture, routine-abscess     Status: None   Collection Time: 08/08/15 11:45 PM  Result Value Ref Range Status   Specimen Description ABSCESS LEFT AXILLA  Final   Special Requests NONE  Final   Gram Stain   Final    ABUNDANT WBC PRESENT,BOTH PMN AND MONONUCLEAR RARE SQUAMOUS EPITHELIAL CELLS PRESENT FEW GRAM NEGATIVE RODS FEW GRAM POSITIVE COCCI IN PAIRS    Culture   Final    MODERATE LACTOBACILLUS SPECIES Note: Standardized susceptibility testing for this organism is not available. Performed at [facility]    Report Status 08/12/2015 FINAL  Final    Imaging: I have independently reviewed the patient's CT scan which demonstrates a 2.4 centimeter left UVJ stone with several large obstructing stones in the renal pelvis and associate hydronephrosis.  Imp: The patient has a large obstructing stone at the left UVJ and clinical evidence of a urinary tract infection. She is surprisingly fairly comfortable and nontoxic appearing.  Recommendations: I went over the patient's circumstance with her and her family in quite a bit of detail. We discussed management strategies and ultimately I recommended the patient undergo a left percutaneous nephrostomy tube. Once the tube is in place she will need to be treated for 2 weeks with appropriate, culture driven antibiotics. I will plan to take the patient to the operating room once she has been adequately treated for infection for definitive management of her stones.  We then discussed the treatment for stones which would be a left percutaneous nephrolithotomy. I  described the surgery for the patient in detail. She understands that the procedure requires general anesthesia and she would be in the prone position. We would perform the surgery through the nephrostomy tube track. Following the operation the patient will have a ureteral stent. She will be admitted for at least 1 night. She understands that she is likely to require second look procedure as well. I discussed the risks and the benefits of this operation in significant detail for the patient. Having gone through the whole treatment process the patient has opted to proceed.  I will get this scheduled for her over the next few days. My office will contact her with the surgery times a day.   Berniece SalinesHERRICK, Natanya Holecek W

## 2016-12-23 NOTE — Progress Notes (Signed)
New Admission Note:  Arrival Method: By bed from ED at [phone] Mental Orientation: Alert and oriented Telemetry: None Iv: R hand NSL at 125 Pain: Manageable at this moment, does not want opioids per pt Tubes: None Safety Measures: Safety Fall Prevention Plan was given, discussed Admission: Completed [address] Orientation: Patient has been orientated to the room, unit and the staff. Family: Family at bedside  Orders have been reviewed and implemented. Will continue to monitor the patient. Call light has been placed within reach.  Alfonse Rashristy Violet Seabury, RN  Phone Number: [phone]

## 2016-12-23 NOTE — ED Provider Notes (Signed)
[facility]-EMERGENCY DEPT Provider Note   CSN: [id number] Arrival date & time: 12/23/16  [id number]   By signing my name below, I, Bobbie StackChristopher Reid & Freida Busmaniana Omoyeni, attest that this documentation has been prepared under the direction and in the presence of Glynn OctaveStephen Omair Dettmer, MD. Electronically Signed: Bobbie Stackhristopher Reid, Scribe. 12/23/16. 3:08 AM.  History   Chief Complaint Chief Complaint  Patient presents with  . Emesis     The history is provided by the patient. No language interpreter was used.   HPI Comments: Tracy Graves is a 55 y.o. female who presents to the Emergency Department complaining of persistent nausea x 2 days. Patient reports that she was placed on antibiotics 2 days ago for her UTI; states dysuria has resolved. She is still currently taking these antibiotics. She reports associated constipation x 3-4 days; last BM was ~5 days ago. She also reports vomit x 6 over the past day and a half. She denies any blood in her vomit. Pt was also discharged with a Rx for Zofran which she was unable to fill due to the price.  She also denies abdominal pain, CP and SOB. She reports that her hemorrhoids have been bleeding in the last 2 weeks but not currently. Patient has a hx of diabetes, but denies any other past medical hx. No h/o abdominal surgeries. Pt is still able to pass gas.   Past Medical History:  Diagnosis Date  . Abscess    chest wall, right breast  . Cellulitis   . Hyperglycemia due to type 2 diabetes mellitus (HCC) 06/05/2015  . Hypertension   . Hypothyroid    2016  . Intertrigo   . Morbid obesity due to excess calories (HCC) 06/05/2015  . Tobacco use     Patient Active Problem List   Diagnosis Date Noted  . Lactic acidosis 06/08/2015  . Hypothyroidism 06/07/2015  . Intertrigo 06/07/2015  . Abscess of chest wall   . Cellulitis of chest wall   . Dysarthria   . Hyperglycemia due to type 2 diabetes mellitus (HCC) 06/05/2015  . Skin abscess 06/05/2015  . Morbid obesity due  to excess calories (HCC) 06/05/2015  . Tobacco abuse 06/05/2015  . Uncontrolled diabetes mellitus (HCC) 06/05/2015    History reviewed. No pertinent surgical history.  OB History    No data available       Home Medications    Prior to Admission medications   Medication Sig Start Date End Date Taking? Authorizing Provider  cephALEXin (KEFLEX) 500 MG capsule Take 1 capsule (500 mg total) by mouth 4 (four) times daily. 12/21/16   Chase PicketJaime Pilcher Ward, PA-C  levothyroxine (SYNTHROID, LEVOTHROID) 112 MCG tablet Take 112 mcg by mouth daily before breakfast.    Historical Provider, MD  levothyroxine (SYNTHROID, LEVOTHROID) 75 MCG tablet Take 1 tablet (75 mcg total) by mouth daily before breakfast. Patient not taking: Reported on 12/21/2016 06/08/15   Gwenyth BenderKaren M Black, NP  lisinopril (PRINIVIL,ZESTRIL) 10 MG tablet Take 10 mg by mouth daily.     Historical Provider, MD  metFORMIN (GLUCOPHAGE) 500 MG tablet Take by mouth 2 (two) times daily with a meal.    Historical Provider, MD  nicotine (NICODERM CQ - DOSED IN MG/24 HOURS) 21 mg/24hr patch Place 1 patch (21 mg total) onto the skin daily. Patient not taking: Reported on 12/21/2016 06/08/15   Gwenyth BenderKaren M Black, NP  ondansetron (ZOFRAN ODT) 4 MG disintegrating tablet Take 1 tablet (4 mg total) by mouth every 8 (eight) hours as needed  for nausea or vomiting. 12/21/16   Chase PicketJaime Pilcher Ward, PA-C  phenazopyridine (PYRIDIUM) 200 MG tablet Take 1 tablet (200 mg total) by mouth 3 (three) times daily as needed for pain. 12/21/16   Chase PicketJaime Pilcher Ward, PA-C    Family History No family history on file.  Social History Social History  Substance Use Topics  . Smoking status: Current Every Day Smoker    Packs/day: 1.00    Types: Cigarettes  . Smokeless tobacco: Current User  . Alcohol use No     Allergies   Patient has no known allergies.   Review of Systems Review of Systems A complete 10 system review of systems was obtained and all systems are  negative except as noted in the HPI and PMH.   Physical Exam Updated Vital Signs BP 141/91 (BP Location: Right Arm)   Pulse 94   Temp 98.1 F (36.7 C) (Oral)   Resp 19   SpO2 97%   Physical Exam  Constitutional: She is oriented to person, place, and time. She appears well-developed and well-nourished. No distress.  HENT:  Head: Normocephalic and atraumatic.  Mouth/Throat: No oropharyngeal exudate.  Dry mucous membranes.  Eyes: Conjunctivae and EOM are normal. Pupils are equal, round, and reactive to light.  Neck: Normal range of motion. Neck supple.  No meningismus.  Cardiovascular: Normal rate, regular rhythm, normal heart sounds and intact distal pulses.   No murmur heard. Pulmonary/Chest: Effort normal and breath sounds normal. No respiratory distress.  Abdominal: Soft. There is no tenderness. There is no rebound and no guarding.  Obese abdomen. No CVA tenderness.  Genitourinary:  Genitourinary Comments: Chaperone (scribe) was present for rectal exam which was performed with no discomfort or complications.   Small external hemorrhoids. No fecal impaction.  Musculoskeletal: Normal range of motion. She exhibits no edema or tenderness.  Neurological: She is alert and oriented to person, place, and time. No cranial nerve deficit. She exhibits normal muscle tone. Coordination normal.   5/5 strength throughout. CN 2-12 intact.Equal grip strength.   Skin: Skin is warm.  Psychiatric: She has a normal mood and affect. Her behavior is normal.  Nursing note and vitals reviewed.    ED Treatments / Results  DIAGNOSTIC STUDIES: Oxygen Saturation is 94% on RA, adequate by my interpretation.    COORDINATION OF CARE: 3:08 AM Discussed treatment plan with pt at bedside and pt agreed to plan.  Labs (all labs ordered are listed, but only abnormal results are displayed) Labs Reviewed  COMPREHENSIVE METABOLIC PANEL - Abnormal; Notable for the following:       Result Value   Sodium 133  (*)    CO2 21 (*)    Glucose, Bld 123 (*)    Creatinine, Ser 1.38 (*)    Calcium 10.6 (*)    GFR calc non Af Amer 42 (*)    GFR calc Af Amer 49 (*)    All other components within normal limits  CBC - Abnormal; Notable for the following:    WBC 15.1 (*)    Hemoglobin 15.3 (*)    Platelets 480 (*)    All other components within normal limits  URINALYSIS, ROUTINE W REFLEX MICROSCOPIC - Abnormal; Notable for the following:    Hgb urine dipstick LARGE (*)    Ketones, ur 15 (*)    Protein, ur 100 (*)    Nitrite POSITIVE (*)    Leukocytes, UA TRACE (*)    All other components within normal limits  URINALYSIS, MICROSCOPIC (  REFLEX) - Abnormal; Notable for the following:    Bacteria, UA RARE (*)    Squamous Epithelial / LPF 0-5 (*)    All other components within normal limits  LIPASE, BLOOD  POC URINE PREG, ED    EKG  EKG Interpretation None       Radiology Ct Renal Stone Study  Result Date: 12/23/2016 CLINICAL DATA:  Flank pain and UTI. EXAM: CT ABDOMEN AND PELVIS WITHOUT CONTRAST TECHNIQUE: Multidetector CT imaging of the abdomen and pelvis was performed following the standard protocol without IV contrast. COMPARISON:  None. FINDINGS: Lower chest: No pulmonary nodules or pleural effusion. No visible pericardial effusion. Hepatobiliary: There is cholelithiasis without evidence of acute cholecystitis. No biliary dilatation. Hepatic size and contours are normal. No focal liver lesions. Pancreas: Normal noncontrast appearance of the pancreas. No peripancreatic fluid collection. Spleen: Normal. Adrenal glands: Normal. Urinary Tract: --Right kidney: No hydronephrosis or perinephric stranding. No nephrolithiasis. No obstructing ureteral stones. --Left kidney: There is severe left hydronephrosis. There is a 2.4 cm stone within the proximal left ureter, just distal to the ureteropelvic junction. More proximally in the left ureter, there are stone fragments that measure up to 4 mm. There are  multiple calcified foci within the left renal pelvis, with the largest measuring 1.6 cm. There is mild perinephric stranding. --Urinary bladder: Urinary bladder is nondistended. Stomach/Bowel: There is sigmoid diverticulosis without acute inflammation. No evidence of acute colitis or enteritis. No small bowel obstruction. The appendix is normal. No abdominal fluid collection. No portal venous gas. Vascular/Lymphatic: There is atherosclerotic calcification of the non aneurysmal abdominal aorta. No abdominal or pelvic lymphadenopathy. Reproductive: There is a lobulated contour of the uterus, consistent with multiple fibroids. Ovaries are not clearly visualized. No free fluid in the pelvis Musculoskeletal. Moderate L5-S1 facet arthrosis. No bony spinal canal stenosis. No lytic or blastic lesions. IMPRESSION: 1. Acute left-sided obstructive uropathy with large, 2.4 cm ureteral stone just distal to the left ureteropelvic junction causing severe left hydronephrosis. 2. Multiple large stones and/or stone fragments within the left renal pelvis, measuring up to 1.6 cm. 3. Aortic atherosclerosis. 4. Cholelithiasis without acute cholecystitis and sigmoid diverticulosis without acute diverticulitis. 5. Multilobulated appearance of the uterus, likely multiple fibroids. Electronically Signed   By: Deatra Robinson M.D.   On: 12/23/2016 04:11    Procedures Procedures (including critical care time)  Medications Ordered in ED Medications - No data to display   Initial Impression / Assessment and Plan / ED Course  I have reviewed the triage vital signs and the nursing notes.  Pertinent labs & imaging results that were available during my care of the patient were reviewed by me and considered in my medical decision making (see chart for details).  Clinical Course   persistent nausea and vomiting after recent diagnosis of UTI.  Did not fill zofran.  No fever.  NBNB emesis. Minimal lower back pain.  WBC elevated, no  fever. Nontoxic appearing. UA infected. CUlture sent.  IV rocephin started.  CT with Large L UPJ stone and severe hydronephrosis. D/w Dr. Marlou Porch of urology. He will evaluate. Recommends IR nephrostomy tube.  ADmission dw Dr. Maryfrances Bunnell. Final Clinical Impressions(s) / ED Diagnoses   Final diagnoses:  Hydronephrosis, unspecified hydronephrosis type  Nephrolithiasis  Urinary tract infection with hematuria, site unspecified  Hydronephrosis    New Prescriptions New Prescriptions   No medications on file   I personally performed the services described in this documentation, which was scribed in my presence. The recorded information  has been reviewed and is accurate.    Glynn Octave, MD 12/23/16 [phone]

## 2016-12-23 NOTE — Progress Notes (Signed)
[facility] PLAN OF CARE NOTE Patient: Tracy Graves ZOX:[id number]   PCP: Tracy Graves, Tracy A, FNP DOB: 28-Apr-1961   DOA: 12/23/2016   DOS: 12/23/2016    Patient was admitted by my colleague Dr. [name] earlier on 12/23/2016. I have reviewed the H&P as well as assessment and plan and agree with the same. Important changes in the plan are listed below.  Plan of care: Principal Problem:   Hydronephrosis Active Problems:   Morbid obesity due to excess calories (HCC)   Type 2 diabetes mellitus without complication, without long-term current use of insulin (HCC)   Hypothyroidism, acquired   UTI (urinary tract infection)   AKI (acute kidney injury) (HCC)   Essential hypertension   Hyponatremia    Author: Lynden OxfordPranav Patel, MD [facility] Pager: [phone] 12/23/2016 3:58 PM   If 7PM-7AM, please contact night-coverage at [email or web address], password [email or web address]

## 2016-12-23 NOTE — Sedation Documentation (Signed)
Patient denies pain and is resting comfortably.  

## 2016-12-24 LAB — BASIC METABOLIC PANEL
Anion gap: 4 — ABNORMAL LOW (ref 5–15)
BUN: 9 mg/dL (ref 6–20)
CALCIUM: 10.2 mg/dL (ref 8.9–10.3)
CHLORIDE: 108 mmol/L (ref 101–111)
CO2: 29 mmol/L (ref 22–32)
CREATININE: 1.29 mg/dL — AB (ref 0.44–1.00)
GFR calc non Af Amer: 46 mL/min — ABNORMAL LOW (ref >60)
GFR, EST AFRICAN AMERICAN: 53 mL/min — AB (ref >60)
GLUCOSE: 92 mg/dL (ref 65–99)
Potassium: 4.5 mmol/L (ref 3.5–5.1)
Sodium: 141 mmol/L (ref 135–145)

## 2016-12-24 LAB — URINE CULTURE: CULTURE: NO GROWTH

## 2016-12-24 LAB — GLUCOSE, CAPILLARY: GLUCOSE-CAPILLARY: 100 mg/dL — AB (ref 65–99)

## 2016-12-24 LAB — CBC
HCT: 43.2 % (ref 36.0–46.0)
HEMOGLOBIN: 14.5 g/dL (ref 12.0–15.0)
MCH: 30.9 pg (ref 26.0–34.0)
MCHC: 33.6 g/dL (ref 30.0–36.0)
MCV: 91.9 fL (ref 78.0–100.0)
Platelets: 426 10*3/uL — ABNORMAL HIGH (ref 150–400)
RBC: 4.7 MIL/uL (ref 3.87–5.11)
RDW: 13.6 % (ref 11.5–15.5)
WBC: 9.9 10*3/uL (ref 4.0–10.5)

## 2016-12-24 MED ORDER — TRAMADOL HCL 50 MG PO TABS: 50.0000 mg | ORAL_TABLET | Freq: Four times a day (QID) | ORAL | 0 refills | Status: DC | PRN

## 2016-12-24 MED ORDER — CEPHALEXIN 500 MG PO CAPS: 500.0000 mg | ORAL_CAPSULE | Freq: Two times a day (BID) | ORAL | 0 refills | Status: DC

## 2016-12-24 MED ORDER — METHOCARBAMOL 500 MG PO TABS: 500.0000 mg | ORAL_TABLET | Freq: Three times a day (TID) | ORAL | 0 refills | Status: DC | PRN

## 2016-12-24 MED ORDER — METFORMIN HCL 500 MG PO TABS: 500.0000 mg | ORAL_TABLET | Freq: Two times a day (BID) | ORAL | Status: DC

## 2016-12-24 MED ORDER — POLYETHYLENE GLYCOL 3350 17 G PO PACK
17.0000 g | PACK | Freq: Every day | ORAL | 0 refills | Status: DC | PRN
Start: 2016-12-24 — End: 2019-10-23

## 2016-12-24 NOTE — Discharge Instructions (Signed)
Percutaneous Nephrostomy Home Guide  Percutaneous nephrostomy is a procedure to insert a flexible tube into your kidney so that urine can leave your body. This procedure may be done if a medical condition prevents urine from leaving your kidney in the usual way. During the procedure, the nephrostomy tube is inserted in the right or left side of your lower back and is connected to an external drainage bag.  After you have a nephrostomy tube placed, urine will collect in the drainage bag outside of your body. You will need to empty and change the drainage bag as needed. You will also need to take steps to care for the area where the nephrostomy tube was inserted (tube insertion site).  How do I care for my nephrostomy tube?   Always keep your tubing, the leg bag, or the bedside drainage bag below the level of your kidney so that your urine drains freely.   Avoid activities that would cause bending or pulling of your tubing. Ask your health care provider what activities are safe for you.   When connecting your nephrostomy tube to a drainage bag, make sure that there are no kinks in the tubing and that your urine is draining freely. You may want to gently wrap an elastic bandage over the tubing. This will help keep the tubing in place and prevent it from kinking. Make sure there is no tension on the tubing so it does not become dislodged.   At night, you may want to connect your nephrostomy tube or the leg bag to a larger bedside drainage bag.  How do I empty the drainage bag?  Empty the leg bag or bedside drainage bag whenever it becomes ? full. Also empty it before you go to sleep. Most drainage bags have a drain at the bottom that allows urine to be emptied. Follow these basic steps:  1. Hold the drainage bag over a toilet or collection container. Use a measuring container if your health care provider told you to measure your urine.  2. Open the drain of the bag and allow the urine to drain out.  3. After all the  urine has drained from the drainage bag, close the drain fully.  4. Flush the urine down the toilet. If a collection container was used, rinse the container.    How do I change the dressing around the nephrostomy tube?  Change your dressing and clean your tube exit site as told by your health care provider. You may need to change the dressing every day for the first 2 weeks after having a nephrostomy tube inserted. After the first 2 weeks, you may be told to change the dressing two times a week.  Supplies needed:    Mild soap and water.   Split gauze pads, 4  4 inches (10 x 10 cm).   Gauze pads, 4  4 inches (10 x 10 cm).   Paper tape.  How to change the dressing:   Because of the location of your nephrostomy tube, you may need help from another person to complete dressing changes. Follow these basic steps:  1. Wash hands with soap and water.  2. Gently remove the tape and dressing from around the nephrostomy tube. Be careful not to pull on the tube while removing the dressing. Avoid using scissors because they may damage the tube.  3. Wash the skin around the tube with mild soap and water, rinse well, and pat the skin dry with a clean cloth.    4. Check the skin around the drain for redness, swelling, pus, warmth, or a bad smell.  5. If the drain was sutured to the skin, check the suture to verify that it is still anchored in the skin.  6. Place two split gauze pads in and around the tube exit site. Do not apply ointments or alcohol to the site.  7. Place a gauze pad on top of the split gauze pad.  8. Coil the tube on top of the gauze. The tubing should rest on the gauze, not on the skin.  9. Place tape around each edge of the gauze pad.  10. Secure the nephrostomy tubing. Make sure that the tube does not kink or become pinched. The tubing should rest on the gauze pad, not on the skin.  11. Dispose of used supplies properly.    How do I flush my nephrostomy tube?  Use a saline syringe to rinse out (flush) your  nephrostomy tube as told by your health care provider. Flushing is easier if a three-way stopcock is placed between the tube and the drainage bag. One connection of the stopcock connects to your tube, the second connects to the drainage bag, and the third is usually covered with a cap. The lever on the stopcock points to the direction on the stopcock that is closed to flow. Normally, the lever points in the direction of the cap to allow urine to drain from the tube to the drainage bag.  Supplies needed:    Rubbing alcohol wipe.   10 mL 0.9% saline syringe.  How to flush the tube:   1. Move the lever of the three-way stopcock so it points toward the drainage bag.  2. Clean the cap with a rubbing alcohol wipe.  3. Screw the tip of a 10 mL 0.9% saline syringe onto the cap.  4. Using the syringe plunger, slowly push the 10 mL 0.9% saline in the syringe over 5-10 seconds. If resistance is met or pain occurs while pushing, stop pushing the saline.  5. Remove the syringe from the cap.  6. Return the stopcock lever to the usual position, pointing in the direction of the cap.  7. Dispose of used supplies properly.  How do I replace the drainage bag?  Replace the drainage bag, three-way stopcock, and any extension tubing as told by your health care provider. Make sure you always have an extra drainage bag and connecting tubing available.  1. Empty urine from your drainage bag.  2. Gather a new drainage bag, three-way stopcock, and any extension tubing.  3. Remove the drainage bag, three-way stopcock, and any extension tubing from the nephrostomy tube.  4. Attach the new leg bag or bedside drainage bag, three-way stopcock, and any extension tubing to the nephrostomy tube.  5. Dispose of the used drainage bag, three-way stopcock, and any extension.    Contact a health care provider if:   You have problems with any of the valves or tubing.   You have persistent pain or soreness in your back.   You have more redness,  swelling, or pain around your tube insertion site.   You have more fluid or blood coming from your tube insertion site.   Your tube insertion site feels warm to the touch.   You have pus or a bad smell coming from your tube insertion site.   You have increased urine output or you feel burning when urinating.  Get help right away if:   You   have pain in your abdomen during the first week.   You have chest pain or have trouble breathing.   You have a new appearance of blood in your urine.   You have a fever or chills.   You have back pain that is not relieved by your medicine.   You have decreased urine output.   Your nephrostomy tube comes out.  This information is not intended to replace advice given to you by your health care provider. Make sure you discuss any questions you have with your health care provider.  Document Released: 10/07/2013 Document Revised: 09/28/2016 Document Reviewed: 09/28/2016  [employer] Interactive Patient Education  2017 [employer]

## 2016-12-24 NOTE — Progress Notes (Signed)
Referring Physician(s): P Allena KatzPatel  Supervising Physician: Richarda OverlieHenn, Adam  [facility]  [facility]  Chief Complaint:  Left hydronephrosis  12/24: Pre-procedure Diagnoses:  Hydronephrosis with urinary obstruction due to ureteral calculus [N13.2]       S/p LT 10 FR PCN  No comp Stable To gravity bag Full report in PACS       Subjective:  Up in room Feeling better already PCN intact Draining well   Allergies: Patient has no known allergies.  Medications: Prior to Admission medications   Medication Sig Start Date End Date Taking? Authorizing Provider  levothyroxine (SYNTHROID, LEVOTHROID) 112 MCG tablet Take 112 mcg by mouth daily before breakfast.    Historical Provider, MD  lisinopril (PRINIVIL,ZESTRIL) 10 MG tablet Take 10 mg by mouth daily.     Historical Provider, MD  metFORMIN (GLUCOPHAGE) 500 MG tablet Take by mouth 2 (two) times daily with a meal.    Historical Provider, MD  nicotine (NICODERM CQ - DOSED IN MG/24 HOURS) 21 mg/24hr patch Place 1 patch (21 mg total) onto the skin daily. Patient not taking: Reported on 12/21/2016 06/08/15   Gwenyth BenderKaren M Black, NP     Vital Signs: BP 131/83 (BP Location: Left Arm)   Pulse 79   Temp 98.6 F (37 C) (Oral)   Resp 17   Ht 5\' 4"  (1.626 m)   Wt 221 lb 5.5 oz (100.4 kg)   SpO2 97%   BMI 37.99 kg/m   Physical Exam  Constitutional: She is oriented to person, place, and time.  Abdominal: Soft. Bowel sounds are normal.  Neurological: She is alert and oriented to person, place, and time.  Skin: Skin is warm and dry.  Site of L PCN clean and dry NT No bleeding Output 3L yesterday 100 cc in bag Minimally blood tinged  afeb Wbc wnl Cr sl better  Psychiatric: She has a normal mood and affect.  Nursing note and vitals reviewed.   Imaging: Ct Renal Stone Study  Result Date: 12/23/2016 CLINICAL DATA:  Flank pain and UTI. EXAM: CT ABDOMEN AND PELVIS WITHOUT CONTRAST TECHNIQUE: Multidetector CT imaging of  the abdomen and pelvis was performed following the standard protocol without IV contrast. COMPARISON:  None. FINDINGS: Lower chest: No pulmonary nodules or pleural effusion. No visible pericardial effusion. Hepatobiliary: There is cholelithiasis without evidence of acute cholecystitis. No biliary dilatation. Hepatic size and contours are normal. No focal liver lesions. Pancreas: Normal noncontrast appearance of the pancreas. No peripancreatic fluid collection. Spleen: Normal. Adrenal glands: Normal. Urinary Tract: --Right kidney: No hydronephrosis or perinephric stranding. No nephrolithiasis. No obstructing ureteral stones. --Left kidney: There is severe left hydronephrosis. There is a 2.4 cm stone within the proximal left ureter, just distal to the ureteropelvic junction. More proximally in the left ureter, there are stone fragments that measure up to 4 mm. There are multiple calcified foci within the left renal pelvis, with the largest measuring 1.6 cm. There is mild perinephric stranding. --Urinary bladder: Urinary bladder is nondistended. Stomach/Bowel: There is sigmoid diverticulosis without acute inflammation. No evidence of acute colitis or enteritis. No small bowel obstruction. The appendix is normal. No abdominal fluid collection. No portal venous gas. Vascular/Lymphatic: There is atherosclerotic calcification of the non aneurysmal abdominal aorta. No abdominal or pelvic lymphadenopathy. Reproductive: There is a lobulated contour of the uterus, consistent with multiple fibroids. Ovaries are not clearly visualized. No free fluid in the pelvis Musculoskeletal. Moderate L5-S1 facet arthrosis. No bony spinal canal stenosis. No lytic or  blastic lesions. IMPRESSION: 1. Acute left-sided obstructive uropathy with large, 2.4 cm ureteral stone just distal to the left ureteropelvic junction causing severe left hydronephrosis. 2. Multiple large stones and/or stone fragments within the left renal pelvis, measuring up to  1.6 cm. 3. Aortic atherosclerosis. 4. Cholelithiasis without acute cholecystitis and sigmoid diverticulosis without acute diverticulitis. 5. Multilobulated appearance of the uterus, likely multiple fibroids. Electronically Signed   By: Deatra RobinsonKevin  Herman M.D.   On: 12/23/2016 04:11   Ir Nephrostomy Placement Left  Result Date: 12/23/2016 INDICATION: OBSTRUCTING PROXIMAL LEFT URETERAL CALCULUS WITH HYDRONEPHROSIS EXAM: IR NEPHROSTOMY PLACEMENT LEFT COMPARISON:  12/23/2016 CT MEDICATIONS: Patient is already receiving IV antibiotics as an inpatient ANESTHESIA/SEDATION: Fentanyl 100 mcg IV; Versed 2.0 mg IV Moderate Sedation Time:  6 minutes The patient was continuously monitored during the procedure by the interventional radiology nurse under my direct supervision. CONTRAST:  10 cc Isovue-300 - administered into the collecting system(s) FLUOROSCOPY TIME:  Fluoroscopy Time:  43 seconds (3 mGy). COMPLICATIONS: None immediate. PROCEDURE: Informed written consent was obtained from the patient after a thorough discussion of the procedural risks, benefits and alternatives. All questions were addressed. Maximal Sterile Barrier Technique was utilized including caps, mask, sterile gowns, sterile gloves, sterile drape, hand hygiene and skin antiseptic. A timeout was performed prior to the initiation of the procedure. Previous imaging reviewed. Patient position prone. Preliminary ultrasound performed. The hydronephrotic left kidney was localized. Overlying skin marked. Under sterile conditions and local anesthesia, ultrasound percutaneous needle access performed of a dilated lower pole calyx with a 21 gauge needle. There was return of urine. Guidewire inserted followed by the Accustick dilator set. Contrast injection confirms position in the renal pelvis. Amplatz guidewire inserted. Tract dilatation performed to insert a 10 Gao nephrostomy. Retention loop formed in the renal pelvis. Images obtained for documentation.  Nephrostomy secured with a Prolene suture and a sterile dressing. Catheter connected to external gravity drainage bag. No immediate complication. Patient tolerated the procedure well. IMPRESSION: Successful ultrasound and fluoroscopic 10 Crescenzo left nephrostomy insertion. Electronically Signed   By: Judie PetitM.  Shick M.D.   On: 12/23/2016 10:07    Labs:  CBC:  Recent Labs  12/23/16 0109 12/24/16 0443  WBC 15.1* 9.9  HGB 15.3* 14.5  HCT 43.8 43.2  PLT 480* 426*    COAGS:  Recent Labs  12/23/16 0819  INR 1.00  APTT 33    BMP:  Recent Labs  12/23/16 0109 12/24/16 0443  NA 133* 141  K 4.2 4.5  CL 102 108  CO2 21* 29  GLUCOSE 123* 92  BUN 11 9  CALCIUM 10.6* 10.2  CREATININE 1.38* 1.29*  GFRNONAA 42* 46*  GFRAA 49* 53*    LIVER FUNCTION TESTS:  Recent Labs  12/23/16 0109  BILITOT 0.9  AST 15  ALT 16  ALKPHOS 100  PROT 7.2  ALBUMIN 4.1    Assessment and Plan:  L Perc nephrostomy intact Will follow Plan per Urology  Electronically Signed: Mersedes Alber A 12/24/2016, 9:20 AM   I spent a total of 15 Minutes at the the patient's bedside AND on the patient's hospital floor or unit, greater than 50% of which was counseling/coordinating care for L PCN

## 2016-12-25 ENCOUNTER — Other Ambulatory Visit: Payer: Self-pay | Admitting: Urology

## 2016-12-25 LAB — HEMOGLOBIN A1C
HEMOGLOBIN A1C: 5.3 % (ref 4.8–5.6)
MEAN PLASMA GLUCOSE: 105 mg/dL

## 2016-12-25 NOTE — Discharge Summary (Signed)
[facility] Discharge Summary   Patient: Tracy PieriniLydia F Eshelman JXB:[id number]   PCP: Jerrell BelfastHouse, [name], FNP DOB: 07-29-1961   Date of admission: 12/23/2016   Date of discharge: 12/24/2016     Discharge Diagnoses:  Principal Problem:   Hydronephrosis Active Problems:   Morbid obesity due to excess calories (HCC)   Type 2 diabetes mellitus without complication, without long-term current use of insulin (HCC)   Hypothyroidism, acquired   UTI (urinary tract infection)   AKI (acute kidney injury) (HCC)   Essential hypertension   Hyponatremia   Admitted From: home Disposition:  home  Recommendations for Outpatient Follow-up:  1. Follow-up with urology in 2 weeks. 2. Follow-up with PCP in one   Follow-up Information    Crist FatHERRICK, BENJAMIN W, MD Follow up in 2 week(s).   Specialty:  Urology Contact information: [address] [place] [address] [phone]        Jerrell BelfastHouse, [name], FNP. Schedule an appointment as soon as possible for a visit in 1 week(s).   Specialty:  Nurse Practitioner Contact information: [address] [place] [address] [phone]          Diet recommendation: Regular diet  Activity: The patient is advised to gradually reintroduce usual activities.  Discharge Condition: good  Code Status: Full code  History of present illness: As per the H and P dictated on admission, "Tracy PieriniLydia F Harlacher is a 55 y.o. female with a past medical history significant for NIDDM, hypothyroidism and HTN who presents with UTI and now vomiting.  The patient was in her usual state of health until 2 days ago, Friday when she woke up with typical irritative voiding symptoms like having to urinate repeatedly, dark urine, pain with urination.  She was seen in the ER, had normal vital signs, no back pain, UA with WBC and RBC TNTC and was discharged with pyridium and cephalexin and ondansetron.   Initially she felt better, then Saturday afternoon she had gradual onset nausea, then  repeated non-bloody emesis >6 times so she came to the ER."  Hospital Course:   Summary of her active problems in the hospital is as following. 1. Hydronephrosis and UTI, possible sepsis:  Meets SIRS criteria, has AKI from hydronephrosis, UTI.   Currently hemodynamically stable and appears relatively comfortable. -Consult to IR for nephrostomy tube patient tolerated it well. Renal function significantly improved. Patient will be on oral Keflex per urology recommendation on discharge for 2 weeks. Urine culture negative.  2. Acute kidney injury:  In setting of pyelo and obstruction. Resolved with IV hydration and PCN  3. Hyponatremia:  Result  4. HTN:  Stop lisinopril on discharge  5. NIDDM:  -Hold metformin for 48 hours -SSI with meals  6. Hypothyroidism:  -Continue levothyroxine  7. Smoking:  -Nicotine patch -Smoking cessation encouraged   All other chronic medical condition were stable during the hospitalization.  Patient was ambulatory without any assistance. On the day of the discharge the patient's vitals were stable, and no other acute medical condition were reported by patient. the patient was felt safe to be discharge at home  with family.  Procedures and Results:  IR guided percutaneous nephrostomy tube placement left   Consultations:  Interventional radiology   Urology  DISCHARGE MEDICATION: Discharge Medication List as of 12/24/2016 10:39 AM    START taking these medications   Details  cephALEXin (KEFLEX) 500 MG capsule Take 1 capsule (500 mg total) by mouth 2 (two) times daily., Starting Mon 12/24/2016, Normal  methocarbamol (ROBAXIN) 500 MG tablet Take 1 tablet (500 mg total) by mouth every 8 (eight) hours as needed for muscle spasms., Starting Mon 12/24/2016, Normal    polyethylene glycol (MIRALAX / GLYCOLAX) packet Take 17 g by mouth daily as needed for mild constipation., Starting Mon 12/24/2016, Normal    traMADol (ULTRAM) 50 MG  tablet Take 1 tablet (50 mg total) by mouth every 6 (six) hours as needed., Starting Mon 12/24/2016, Print      CONTINUE these medications which have CHANGED   Details  metFORMIN (GLUCOPHAGE) 500 MG tablet Take 1 tablet (500 mg total) by mouth 2 (two) times daily with a meal., Starting Wed 12/26/2016, No Print      CONTINUE these medications which have NOT CHANGED   Details  levothyroxine (SYNTHROID, LEVOTHROID) 112 MCG tablet Take 112 mcg by mouth daily before breakfast., Historical Med    nicotine (NICODERM CQ - DOSED IN MG/24 HOURS) 21 mg/24hr patch Place 1 patch (21 mg total) onto the skin daily., Starting Wed 06/08/2015, Print      STOP taking these medications     lisinopril (PRINIVIL,ZESTRIL) 10 MG tablet        No Known Allergies Discharge Instructions    Diet - low sodium heart healthy    Complete by:  As directed    Diet Carb Modified    Complete by:  As directed    Discharge instructions    Complete by:  As directed    It is important that you read following instructions as well as go over your medication list with RN to help you understand your care after this hospitalization.  Discharge Instructions: Please follow-up with PCP in one week  Please request your primary care physician to go over all Hospital Tests and Procedure/Radiological results at the follow up,  Please get all Hospital records sent to your PCP by signing hospital release before you go home.   Do not drive, operating heavy machinery, perform activities at heights, swimming or participation in water activities or provide baby sitting services while your are on Pain, Sleep and Anxiety Medications; until you have been seen by Primary Care Physician or a Neurologist and advised to do so again. Do not take more than prescribed Pain, Sleep and Anxiety Medications. You were cared for by a hospitalist during your hospital stay. If you have any questions about your discharge medications or the care you  received while you were in the hospital after you are discharged, you can call the unit and ask to speak with the hospitalist on call if the hospitalist that took care of you is not available.  Once you are discharged, your primary care physician will handle any further medical issues. Please note that NO REFILLS for any discharge medications will be authorized once you are discharged, as it is imperative that you return to your primary care physician (or establish a relationship with a primary care physician if you do not have one) for your aftercare needs so that they can reassess your need for medications and monitor your lab values. You Must read complete instructions/literature along with all the possible adverse reactions/side effects for all the Medicines you take and that have been prescribed to you. Take any new Medicines after you have completely understood and accept all the possible adverse reactions/side effects. Wear Seat belts while driving. If you have smoked or chewed Tobacco in the last 2 yrs please stop smoking and/or stop any Recreational drug use.   Increase activity  slowly    Complete by:  As directed    Increase activity slowly    Complete by:  As directed      Discharge Exam: Filed Weights   12/23/16 2112  Weight: 100.4 kg (221 lb 5.5 oz)   Vitals:   12/24/16 0524 12/24/16 1043  BP: 131/83 115/76  Pulse: 79 81  Resp: 17 18  Temp: 98.6 F (37 C) 98.2 F (36.8 C)   General: Appear in no distress, no Rash; Oral Mucosa moist. Cardiovascular: S1 and S2 Present, no Murmur, no JVD Respiratory: Bilateral Air entry present and Clear to Auscultation, no Crackles, no wheezes Abdomen: Bowel Sound present, Soft and no tenderness Extremities: no Pedal edema, no calf tenderness Neurology: Grossly no focal neuro deficit.  The results of significant diagnostics from this hospitalization (including imaging, microbiology, ancillary and laboratory) are listed below for reference.     Significant Diagnostic Studies: Ct Renal Stone Study  Result Date: 12/23/2016 CLINICAL DATA:  Flank pain and UTI. EXAM: CT ABDOMEN AND PELVIS WITHOUT CONTRAST TECHNIQUE: Multidetector CT imaging of the abdomen and pelvis was performed following the standard protocol without IV contrast. COMPARISON:  None. FINDINGS: Lower chest: No pulmonary nodules or pleural effusion. No visible pericardial effusion. Hepatobiliary: There is cholelithiasis without evidence of acute cholecystitis. No biliary dilatation. Hepatic size and contours are normal. No focal liver lesions. Pancreas: Normal noncontrast appearance of the pancreas. No peripancreatic fluid collection. Spleen: Normal. Adrenal glands: Normal. Urinary Tract: --Right kidney: No hydronephrosis or perinephric stranding. No nephrolithiasis. No obstructing ureteral stones. --Left kidney: There is severe left hydronephrosis. There is a 2.4 cm stone within the proximal left ureter, just distal to the ureteropelvic junction. More proximally in the left ureter, there are stone fragments that measure up to 4 mm. There are multiple calcified foci within the left renal pelvis, with the largest measuring 1.6 cm. There is mild perinephric stranding. --Urinary bladder: Urinary bladder is nondistended. Stomach/Bowel: There is sigmoid diverticulosis without acute inflammation. No evidence of acute colitis or enteritis. No small bowel obstruction. The appendix is normal. No abdominal fluid collection. No portal venous gas. Vascular/Lymphatic: There is atherosclerotic calcification of the non aneurysmal abdominal aorta. No abdominal or pelvic lymphadenopathy. Reproductive: There is a lobulated contour of the uterus, consistent with multiple fibroids. Ovaries are not clearly visualized. No free fluid in the pelvis Musculoskeletal. Moderate L5-S1 facet arthrosis. No bony spinal canal stenosis. No lytic or blastic lesions. IMPRESSION: 1. Acute left-sided obstructive uropathy  with large, 2.4 cm ureteral stone just distal to the left ureteropelvic junction causing severe left hydronephrosis. 2. Multiple large stones and/or stone fragments within the left renal pelvis, measuring up to 1.6 cm. 3. Aortic atherosclerosis. 4. Cholelithiasis without acute cholecystitis and sigmoid diverticulosis without acute diverticulitis. 5. Multilobulated appearance of the uterus, likely multiple fibroids. Electronically Signed   By: Deatra Robinson M.D.   On: 12/23/2016 04:11   Ir Nephrostomy Placement Left  Result Date: 12/23/2016 INDICATION: OBSTRUCTING PROXIMAL LEFT URETERAL CALCULUS WITH HYDRONEPHROSIS EXAM: IR NEPHROSTOMY PLACEMENT LEFT COMPARISON:  12/23/2016 CT MEDICATIONS: Patient is already receiving IV antibiotics as an inpatient ANESTHESIA/SEDATION: Fentanyl 100 mcg IV; Versed 2.0 mg IV Moderate Sedation Time:  6 minutes The patient was continuously monitored during the procedure by the interventional radiology nurse under my direct supervision. CONTRAST:  10 cc Isovue-300 - administered into the collecting system(s) FLUOROSCOPY TIME:  Fluoroscopy Time:  43 seconds (3 mGy). COMPLICATIONS: None immediate. PROCEDURE: Informed written consent was obtained from the patient after  a thorough discussion of the procedural risks, benefits and alternatives. All questions were addressed. Maximal Sterile Barrier Technique was utilized including caps, mask, sterile gowns, sterile gloves, sterile drape, hand hygiene and skin antiseptic. A timeout was performed prior to the initiation of the procedure. Previous imaging reviewed. Patient position prone. Preliminary ultrasound performed. The hydronephrotic left kidney was localized. Overlying skin marked. Under sterile conditions and local anesthesia, ultrasound percutaneous needle access performed of a dilated lower pole calyx with a 21 gauge needle. There was return of urine. Guidewire inserted followed by the Accustick dilator set. Contrast injection  confirms position in the renal pelvis. Amplatz guidewire inserted. Tract dilatation performed to insert a 10 Boquet nephrostomy. Retention loop formed in the renal pelvis. Images obtained for documentation. Nephrostomy secured with a Prolene suture and a sterile dressing. Catheter connected to external gravity drainage bag. No immediate complication. Patient tolerated the procedure well. IMPRESSION: Successful ultrasound and fluoroscopic 10 Lazar left nephrostomy insertion. Electronically Signed   By: Judie Petit.  Shick M.D.   On: 12/23/2016 10:07    Microbiology: Recent Results (from the past 240 hour(s))  Urine culture     Status: None   Collection Time: 12/23/16  1:12 AM  Result Value Ref Range Status   Specimen Description URINE, RANDOM  Final   Special Requests NONE  Final   Culture NO GROWTH  Final   Report Status 12/24/2016 FINAL  Final  Culture, blood (routine x 2)     Status: None (Preliminary result)   Collection Time: 12/23/16  6:15 AM  Result Value Ref Range Status   Specimen Description BLOOD LEFT HAND  Final   Special Requests BOTTLES DRAWN AEROBIC AND ANAEROBIC  5CC  Final   Culture NO GROWTH 2 DAYS  Final   Report Status PENDING  Incomplete  Culture, blood (routine x 2)     Status: None (Preliminary result)   Collection Time: 12/23/16  6:35 AM  Result Value Ref Range Status   Specimen Description BLOOD LEFT FOREARM  Final   Special Requests BOTTLES DRAWN AEROBIC AND ANAEROBIC  5CC  Final   Culture NO GROWTH 2 DAYS  Final   Report Status PENDING  Incomplete     Labs: CBC:  Recent Labs Lab 12/23/16 0109 12/24/16 0443  WBC 15.1* 9.9  HGB 15.3* 14.5  HCT 43.8 43.2  MCV 90.1 91.9  PLT 480* 426*   Basic Metabolic Panel:  Recent Labs Lab 12/23/16 0109 12/24/16 0443  NA 133* 141  K 4.2 4.5  CL 102 108  CO2 21* 29  GLUCOSE 123* 92  BUN 11 9  CREATININE 1.38* 1.29*  CALCIUM 10.6* 10.2   Liver Function Tests:  Recent Labs Lab 12/23/16 0109  AST 15  ALT 16    ALKPHOS 100  BILITOT 0.9  PROT 7.2  ALBUMIN 4.1    Recent Labs Lab 12/23/16 0109  LIPASE 20   No results for input(s): AMMONIA in the last 168 hours. Cardiac Enzymes: No results for input(s): CKTOTAL, CKMB, CKMBINDEX, TROPONINI in the last 168 hours. BNP (last 3 results) No results for input(s): BNP in the last 8760 hours. CBG:  Recent Labs Lab 12/23/16 0659 12/23/16 1213 12/23/16 1656 12/23/16 2109 12/24/16 0819  GLUCAP 129* 94 104* 118* 100*   Time spent: 30 minutes  Signed:  Yasmene Salomone  [facility] 12/24/2016 , 6:32 PM

## 2016-12-28 LAB — CULTURE, BLOOD (ROUTINE X 2)
CULTURE: NO GROWTH
Culture: NO GROWTH

## 2017-01-11 NOTE — Patient Instructions (Addendum)
Luana ShuLydia F Dau  01/11/2017   Your procedure is scheduled on: 01/17/17  Report to [facility]  [facility] 3[facility] to  Short Stay Center at 7:15 AM.  Call this number if you have problems the morning of surgery [phone]   Remember: ONLY 1 PERSON MAY GO WITH YOU TO SHORT STAY TO GET  READY MORNING OF YOUR SURGERY.            FOLLOW BOWEL PREP INSTRUCTIONS FROM DR. HERRICK'S OFFICE THE DAY BEFORE SURGERY ALONG WITH A CLEAR LIQUID DIET ALL DAY TIL MIDNIGHT!           USE THE MAGNESIUM CITRATE BY 12 NOON THE DAY BEFORE SURGERY AND USE A FLEET'S ENEMA THE NIGHT BEFORE SURGERY!   CLEAR LIQUID DIET   Foods Allowed                                                                     Foods Excluded  Coffee and tea, regular and decaf                             liquids that you cannot  Plain Jell-O in any flavor                                             see through such as: Fruit ices (not with fruit pulp)                                     milk, soups, orange juice  Iced Popsicles                                    All solid food Carbonated beverages, regular and diet                                    Cranberry, grape and apple juices Sports drinks like Gatorade Lightly seasoned clear broth or consume(fat free) Sugar, honey syrup  Sample Menu Breakfast                                Lunch                                     Supper Cranberry juice                    Beef broth                            Chicken broth Jell-O  Grape juice                           Apple juice Coffee or tea                        Jell-O                                      Popsicle                                                Coffee or tea                        Coffee or tea  _____________________________________________________________________    Do not eat food or drink liquids :After Midnight.     Take  these medicines the morning of surgery with A SIP OF WATER:  Levothyroxine                                 You may not have any metal on your body including hair pins and              piercings  Do not wear jewelry, make-up, lotions, powders or perfumes, deodorant             Do not wear nail polish.  Do not shave  48 hours prior to surgery.              Men may shave face and neck.   Do not bring valuables to the hospital. [facility] IS NOT             RESPONSIBLE   FOR VALUABLES.  Contacts, dentures or bridgework may not be worn into surgery.  Leave suitcase in the car. After surgery it may be brought to your room.                  Please read over the following fact sheets you were given: _____________________________________________________________________             [facility] - Preparing for Surgery Before surgery, you can play an important role.  Because skin is not sterile, your skin needs to be as free of germs as possible.  You can reduce the number of germs on your skin by washing with CHG (chlorahexidine gluconate) soap before surgery.  CHG is an antiseptic cleaner which kills germs and bonds with the skin to continue killing germs even after washing. Please DO NOT use if you have an allergy to CHG or antibacterial soaps.  If your skin becomes reddened/irritated stop using the CHG and inform your nurse when you arrive at Short Stay. Do not shave (including legs and underarms) for at least 48 hours prior to the first CHG shower.  You may shave your face/neck. Please follow these instructions carefully:  1.  Shower with CHG Soap the night before surgery and the  morning of Surgery.  2.  If you choose to wash your hair, wash your hair first as usual with your  normal  shampoo.  3.  After you shampoo, rinse your hair and  body thoroughly to remove the  shampoo.                           4.  Use CHG as you would any other liquid soap.  You can apply chg directly  to the skin and  wash                       Gently with a scrungie or clean washcloth.  5.  Apply the CHG Soap to your body ONLY FROM THE NECK DOWN.   Do not use on face/ open                           Wound or open sores. Avoid contact with eyes, ears mouth and genitals (private parts).                       Wash face,  Genitals (private parts) with your normal soap.             6.  Wash thoroughly, paying special attention to the area where your surgery  will be performed.  7.  Thoroughly rinse your body with warm water from the neck down.  8.  DO NOT shower/wash with your normal soap after using and rinsing off  the CHG Soap.                9.  Pat yourself dry with a clean towel.            10.  Wear clean pajamas.            11.  Place clean sheets on your bed the night of your first shower and do not  sleep with pets. Day of Surgery : Do not apply any lotions/deodorants the morning of surgery.  Please wear clean clothes to the hospital/surgery center.  FAILURE TO FOLLOW THESE INSTRUCTIONS MAY RESULT IN THE CANCELLATION OF YOUR SURGERY PATIENT SIGNATURE_________________________________  NURSE SIGNATURE__________________________________  ________________________________________________________________________ How to Manage Your Diabetes Before and After Surgery  Why is it important to control my blood sugar before and after surgery? . Improving blood sugar levels before and after surgery helps healing and can limit problems. . A way of improving blood sugar control is eating a healthy diet by: o  Eating less sugar and carbohydrates o  Increasing activity/exercise o  Talking with your doctor about reaching your blood sugar goals . High blood sugars (greater than 180 mg/dL) can raise your risk of infections and slow your recovery, so you will need to focus on controlling your diabetes during the weeks before surgery. . Make sure that the doctor who takes care of your diabetes knows about your planned  surgery including the date and location.  How do I manage my blood sugar before surgery? . Check your blood sugar at least 4 times a day, starting 2 days before surgery, to make sure that the level is not too high or low. o Check your blood sugar the morning of your surgery when you wake up and every 2 hours until you get to the Short Stay unit. . If your blood sugar is less than 70 mg/dL, you will need to treat for low blood sugar: o Do not take insulin. o Treat a low blood sugar (less than 70 mg/dL) with  cup of clear juice (cranberry or apple), 4 glucose tablets, OR  glucose gel. o Recheck blood sugar in 15 minutes after treatment (to make sure it is greater than 70 mg/dL). If your blood sugar is not greater than 70 mg/dL on recheck, call [phone] for further instructions. . Report your blood sugar to the short stay nurse when you get to Short Stay.  . If you are admitted to the hospital after surgery: o Your blood sugar will be checked by the staff and you will probably be given insulin after surgery (instead of oral diabetes medicines) to make sure you have good blood sugar levels. o The goal for blood sugar control after surgery is 80-180 mg/dL.   WHAT DO I DO ABOUT MY DIABETES MEDICATION?  Marland Kitchen Do not take oral diabetes medicines (pills) the morning of surgery.     WHAT IS A BLOOD TRANSFUSION? Blood Transfusion Information  A transfusion is the replacement of blood or some of its parts. Blood is made up of multiple cells which provide different functions.  Red blood cells carry oxygen and are used for blood loss replacement.  White blood cells fight against infection.  Platelets control bleeding.  Plasma helps clot blood.  Other blood products are available for specialized needs, such as hemophilia or other clotting disorders. BEFORE THE TRANSFUSION  Who gives blood for transfusions?   Healthy volunteers who are fully evaluated to make sure their blood is safe. This is  blood bank blood. Transfusion therapy is the safest it has ever been in the practice of medicine. Before blood is taken from a donor, a complete history is taken to make sure that person has no history of diseases nor engages in risky social behavior (examples are intravenous drug use or sexual activity with multiple partners). The donor's travel history is screened to minimize risk of transmitting infections, such as malaria. The donated blood is tested for signs of infectious diseases, such as HIV and hepatitis. The blood is then tested to be sure it is compatible with you in order to minimize the chance of a transfusion reaction. If you or a relative donates blood, this is often done in anticipation of surgery and is not appropriate for emergency situations. It takes many days to process the donated blood. RISKS AND COMPLICATIONS Although transfusion therapy is very safe and saves many lives, the main dangers of transfusion include:   Getting an infectious disease.  Developing a transfusion reaction. This is an allergic reaction to something in the blood you were given. Every precaution is taken to prevent this. The decision to have a blood transfusion has been considered carefully by your caregiver before blood is given. Blood is not given unless the benefits outweigh the risks. AFTER THE TRANSFUSION  Right after receiving a blood transfusion, you will usually feel much better and more energetic. This is especially true if your red blood cells have gotten low (anemic). The transfusion raises the level of the red blood cells which carry oxygen, and this usually causes an energy increase.  The nurse administering the transfusion will monitor you carefully for complications. HOME CARE INSTRUCTIONS  No special instructions are needed after a transfusion. You may find your energy is better. Speak with your caregiver about any limitations on activity for underlying diseases you may have. SEEK MEDICAL  CARE IF:   Your condition is not improving after your transfusion.  You develop redness or irritation at the intravenous (IV) site. SEEK IMMEDIATE MEDICAL CARE IF:  Any of the following symptoms occur over the next 12 hours:  Shaking chills.  You have a temperature by mouth above 102 F (38.9 C), not controlled by medicine.  Chest, back, or muscle pain.  People around you feel you are not acting correctly or are confused.  Shortness of breath or difficulty breathing.  Dizziness and fainting.  You get a rash or develop hives.  You have a decrease in urine output.  Your urine turns a dark color or changes to pink, red, or brown. Any of the following symptoms occur over the next 10 days:  You have a temperature by mouth above 102 F (38.9 C), not controlled by medicine.  Shortness of breath.  Weakness after normal activity.  The white part of the eye turns yellow (jaundice).  You have a decrease in the amount of urine or are urinating less often.  Your urine turns a dark color or changes to pink, red, or brown. Document Released: 12/14/2000 Document Revised: 03/10/2012 Document Reviewed: 08/02/2008 [facility] Patient Information 2014 [place], [place].  _______________________________________________________________________

## 2017-01-14 ENCOUNTER — Encounter (HOSPITAL_COMMUNITY): Payer: Self-pay

## 2017-01-14 ENCOUNTER — Encounter (HOSPITAL_COMMUNITY)
Admission: RE | Admit: 2017-01-14 | Discharge: 2017-01-14 | Disposition: A | Discharge status: Home / Self Care | Payer: Self-pay | Source: Ambulatory Visit | Attending: Urology | Admitting: Urology

## 2017-01-14 DIAGNOSIS — I10 Essential (primary) hypertension: Secondary | ICD-10-CM | POA: Insufficient documentation

## 2017-01-14 DIAGNOSIS — Z720 Tobacco use: Secondary | ICD-10-CM | POA: Insufficient documentation

## 2017-01-14 DIAGNOSIS — E039 Hypothyroidism, unspecified: Secondary | ICD-10-CM | POA: Insufficient documentation

## 2017-01-14 DIAGNOSIS — Z01810 Encounter for preprocedural cardiovascular examination: Secondary | ICD-10-CM | POA: Insufficient documentation

## 2017-01-14 DIAGNOSIS — N200 Calculus of kidney: Secondary | ICD-10-CM | POA: Insufficient documentation

## 2017-01-14 DIAGNOSIS — E1165 Type 2 diabetes mellitus with hyperglycemia: Secondary | ICD-10-CM | POA: Insufficient documentation

## 2017-01-14 DIAGNOSIS — I252 Old myocardial infarction: Secondary | ICD-10-CM | POA: Insufficient documentation

## 2017-01-14 DIAGNOSIS — Z01812 Encounter for preprocedural laboratory examination: Secondary | ICD-10-CM | POA: Insufficient documentation

## 2017-01-14 HISTORY — DX: Personal history of urinary calculi: Z87.442

## 2017-01-14 LAB — GLUCOSE, CAPILLARY: Glucose-Capillary: 115 mg/dL — ABNORMAL HIGH (ref 65–99)

## 2017-01-14 LAB — ABO/RH: ABO/RH(D): O POS

## 2017-01-14 NOTE — Progress Notes (Signed)
   01/14/17 0821  OBSTRUCTIVE SLEEP APNEA  Have you ever been diagnosed with sleep apnea through a sleep study? No  Do you snore loudly (loud enough to be heard through closed doors)?  0  Do you often feel tired, fatigued, or sleepy during the daytime (such as falling asleep during driving or talking to someone)? 1  Has anyone observed you stop breathing during your sleep? 0  Do you have, or are you being treated for high blood pressure? 1  BMI more than 35 kg/m2? 1  Age > 50 (1-yes) 1  Neck circumference greater than:Female 16 inches or larger, Female 17inches or larger? 1  Female Gender (Yes=1) 0  Obstructive Sleep Apnea Score 5  Score 5 or greater  Results sent to PCP

## 2017-01-16 LAB — URINE CULTURE: Culture: >=100000 — AB

## 2017-01-16 NOTE — Progress Notes (Signed)
Patient called short stay to cancel surgery due to weather. Patient instructed to call office ASAP and we will notify OR. [place] and [place] stated she would notify Dr. Marlou PorchHerrick. Chart returned to PST.

## 2017-01-17 ENCOUNTER — Encounter (HOSPITAL_COMMUNITY): Admission: RE | Payer: Self-pay | Source: Ambulatory Visit

## 2017-01-17 ENCOUNTER — Ambulatory Visit (HOSPITAL_COMMUNITY): Admission: RE | Admit: 2017-01-17 | Payer: Self-pay | Source: Ambulatory Visit | Admitting: Urology

## 2017-01-17 LAB — TYPE AND SCREEN
ABO/RH(D): O POS
ANTIBODY SCREEN: NEGATIVE

## 2017-01-17 SURGERY — NEPHROLITHOTOMY PERCUTANEOUS
Anesthesia: General | Laterality: Left

## 2017-01-18 ENCOUNTER — Other Ambulatory Visit: Payer: Self-pay | Admitting: Urology

## 2017-01-28 ENCOUNTER — Ambulatory Visit (HOSPITAL_COMMUNITY): Payer: Self-pay | Admitting: Anesthesiology

## 2017-01-28 ENCOUNTER — Observation Stay (HOSPITAL_COMMUNITY): Payer: Self-pay

## 2017-01-28 ENCOUNTER — Encounter (HOSPITAL_COMMUNITY)
Admission: RE | Disposition: A | Discharge status: Home / Self Care | Payer: Self-pay | Source: Ambulatory Visit | Attending: Urology

## 2017-01-28 ENCOUNTER — Encounter (HOSPITAL_COMMUNITY): Payer: Self-pay | Admitting: *Deleted

## 2017-01-28 ENCOUNTER — Ambulatory Visit (HOSPITAL_COMMUNITY): Payer: Self-pay

## 2017-01-28 ENCOUNTER — Observation Stay (HOSPITAL_COMMUNITY)
Admission: RE | Admit: 2017-01-28 | Discharge: 2017-01-29 | Disposition: A | Discharge status: Home / Self Care | Payer: Self-pay | Source: Ambulatory Visit | Attending: Urology | Admitting: Urology

## 2017-01-28 DIAGNOSIS — Z7984 Long term (current) use of oral hypoglycemic drugs: Secondary | ICD-10-CM | POA: Insufficient documentation

## 2017-01-28 DIAGNOSIS — Z803 Family history of malignant neoplasm of breast: Secondary | ICD-10-CM | POA: Insufficient documentation

## 2017-01-28 DIAGNOSIS — E039 Hypothyroidism, unspecified: Secondary | ICD-10-CM | POA: Insufficient documentation

## 2017-01-28 DIAGNOSIS — N201 Calculus of ureter: Secondary | ICD-10-CM

## 2017-01-28 DIAGNOSIS — N132 Hydronephrosis with renal and ureteral calculous obstruction: Principal | ICD-10-CM | POA: Insufficient documentation

## 2017-01-28 DIAGNOSIS — E871 Hypo-osmolality and hyponatremia: Secondary | ICD-10-CM | POA: Insufficient documentation

## 2017-01-28 DIAGNOSIS — N200 Calculus of kidney: Secondary | ICD-10-CM

## 2017-01-28 DIAGNOSIS — Z79899 Other long term (current) drug therapy: Secondary | ICD-10-CM | POA: Insufficient documentation

## 2017-01-28 DIAGNOSIS — F17210 Nicotine dependence, cigarettes, uncomplicated: Secondary | ICD-10-CM | POA: Insufficient documentation

## 2017-01-28 DIAGNOSIS — E119 Type 2 diabetes mellitus without complications: Secondary | ICD-10-CM | POA: Insufficient documentation

## 2017-01-28 DIAGNOSIS — L02213 Cutaneous abscess of chest wall: Secondary | ICD-10-CM | POA: Insufficient documentation

## 2017-01-28 DIAGNOSIS — Z806 Family history of leukemia: Secondary | ICD-10-CM | POA: Insufficient documentation

## 2017-01-28 DIAGNOSIS — I10 Essential (primary) hypertension: Secondary | ICD-10-CM | POA: Insufficient documentation

## 2017-01-28 DIAGNOSIS — N390 Urinary tract infection, site not specified: Secondary | ICD-10-CM | POA: Insufficient documentation

## 2017-01-28 DIAGNOSIS — Z8041 Family history of malignant neoplasm of ovary: Secondary | ICD-10-CM | POA: Insufficient documentation

## 2017-01-28 DIAGNOSIS — Z6837 Body mass index (BMI) 37.0-37.9, adult: Secondary | ICD-10-CM | POA: Insufficient documentation

## 2017-01-28 HISTORY — PX: NEPHROLITHOTOMY: SHX5134

## 2017-01-28 LAB — BASIC METABOLIC PANEL
ANION GAP: 10 (ref 5–15)
ANION GAP: 7 (ref 5–15)
BUN: 10 mg/dL (ref 6–20)
BUN: 11 mg/dL (ref 6–20)
CHLORIDE: 100 mmol/L — AB (ref 101–111)
CHLORIDE: 102 mmol/L (ref 101–111)
CO2: 27 mmol/L (ref 22–32)
CO2: 25 mmol/L (ref 22–32)
Calcium: 11 mg/dL — ABNORMAL HIGH (ref 8.9–10.3)
Calcium: 10.3 mg/dL (ref 8.9–10.3)
Creatinine, Ser: 1.07 mg/dL — ABNORMAL HIGH (ref 0.44–1.00)
Creatinine, Ser: 1.13 mg/dL — ABNORMAL HIGH (ref 0.44–1.00)
GFR calc Af Amer: >60 mL/min (ref >60)
GFR calc non Af Amer: 57 mL/min — ABNORMAL LOW (ref >60)
GFR calc non Af Amer: 54 mL/min — ABNORMAL LOW (ref >60)
GLUCOSE: 101 mg/dL — AB (ref 65–99)
Glucose, Bld: 136 mg/dL — ABNORMAL HIGH (ref 65–99)
POTASSIUM: 4.8 mmol/L (ref 3.5–5.1)
POTASSIUM: 4.6 mmol/L (ref 3.5–5.1)
SODIUM: 134 mmol/L — AB (ref 135–145)
Sodium: 137 mmol/L (ref 135–145)

## 2017-01-28 LAB — CBC
HEMATOCRIT: 44 % (ref 36.0–46.0)
HEMOGLOBIN: 15.2 g/dL — AB (ref 12.0–15.0)
MCH: 31.1 pg (ref 26.0–34.0)
MCHC: 34.5 g/dL (ref 30.0–36.0)
MCV: 90.2 fL (ref 78.0–100.0)
Platelets: 524 10*3/uL — ABNORMAL HIGH (ref 150–400)
RBC: 4.88 MIL/uL (ref 3.87–5.11)
RDW: 13.2 % (ref 11.5–15.5)
WBC: 11.8 10*3/uL — ABNORMAL HIGH (ref 4.0–10.5)

## 2017-01-28 LAB — HEMOGLOBIN AND HEMATOCRIT, BLOOD
HEMATOCRIT: 42.9 % (ref 36.0–46.0)
HEMOGLOBIN: 14.5 g/dL (ref 12.0–15.0)

## 2017-01-28 LAB — GLUCOSE, CAPILLARY
GLUCOSE-CAPILLARY: 130 mg/dL — AB (ref 65–99)
GLUCOSE-CAPILLARY: 160 mg/dL — AB (ref 65–99)
GLUCOSE-CAPILLARY: 163 mg/dL — AB (ref 65–99)
Glucose-Capillary: 93 mg/dL (ref 65–99)
Glucose-Capillary: 141 mg/dL — ABNORMAL HIGH (ref 65–99)

## 2017-01-28 LAB — TYPE AND SCREEN
ABO/RH(D): O POS
Antibody Screen: NEGATIVE

## 2017-01-28 SURGERY — NEPHROLITHOTOMY PERCUTANEOUS
Anesthesia: General | Laterality: Left

## 2017-01-28 MED ORDER — KCL IN DEXTROSE-NACL 20-5-0.45 MEQ/L-%-% IV SOLN
INTRAVENOUS | Status: DC
  Administered 2017-01-28: 15:00:00 via INTRAVENOUS
  Filled 2017-01-28 (×3): qty 1000

## 2017-01-28 MED ORDER — ROCURONIUM BROMIDE 10 MG/ML (PF) SYRINGE
PREFILLED_SYRINGE | INTRAVENOUS | Status: DC | PRN
  Administered 2017-01-28: 5 mg via INTRAVENOUS
  Administered 2017-01-28: 10 mg via INTRAVENOUS
  Administered 2017-01-28: 5 mg via INTRAVENOUS
  Administered 2017-01-28 (×2): 10 mg via INTRAVENOUS
  Administered 2017-01-28: 40 mg via INTRAVENOUS
  Administered 2017-01-28 (×2): 10 mg via INTRAVENOUS

## 2017-01-28 MED ORDER — DEXAMETHASONE SODIUM PHOSPHATE 10 MG/ML IJ SOLN
INTRAMUSCULAR | Status: AC
  Filled 2017-01-28: qty 1

## 2017-01-28 MED ORDER — BUPIVACAINE HCL (PF) 0.5 % IJ SOLN
INTRAMUSCULAR | Status: AC
Start: 2017-01-28 — End: 2017-01-28
  Filled 2017-01-28: qty 30

## 2017-01-28 MED ORDER — 0.9 % SODIUM CHLORIDE (POUR BTL) OPTIME
TOPICAL | Status: DC | PRN
  Administered 2017-01-28: 1000 mL

## 2017-01-28 MED ORDER — ACETAMINOPHEN 10 MG/ML IV SOLN
1000.0000 mg | Freq: Four times a day (QID) | INTRAVENOUS | Status: AC
  Administered 2017-01-28 – 2017-01-29 (×2): 1000 mg via INTRAVENOUS
  Filled 2017-01-28 (×4): qty 100

## 2017-01-28 MED ORDER — PHENYLEPHRINE 40 MCG/ML (10ML) SYRINGE FOR IV PUSH (FOR BLOOD PRESSURE SUPPORT)
PREFILLED_SYRINGE | INTRAVENOUS | Status: DC | PRN
  Administered 2017-01-28 (×5): 80 ug via INTRAVENOUS

## 2017-01-28 MED ORDER — ONDANSETRON HCL 4 MG/2ML IJ SOLN
INTRAMUSCULAR | Status: AC
  Filled 2017-01-28: qty 2

## 2017-01-28 MED ORDER — DOCUSATE SODIUM 100 MG PO CAPS
100.0000 mg | ORAL_CAPSULE | Freq: Two times a day (BID) | ORAL | Status: DC
  Administered 2017-01-28 (×2): 100 mg via ORAL
  Filled 2017-01-28 (×2): qty 1

## 2017-01-28 MED ORDER — PROPOFOL 10 MG/ML IV BOLUS
INTRAVENOUS | Status: AC
  Filled 2017-01-28: qty 40

## 2017-01-28 MED ORDER — SCOPOLAMINE 1 MG/3DAYS TD PT72
MEDICATED_PATCH | TRANSDERMAL | Status: AC
  Filled 2017-01-28: qty 1

## 2017-01-28 MED ORDER — INSULIN ASPART 100 UNIT/ML ~~LOC~~ SOLN
0.0000 [IU] | Freq: Three times a day (TID) | SUBCUTANEOUS | Status: DC
  Administered 2017-01-28: 3 [IU] via SUBCUTANEOUS

## 2017-01-28 MED ORDER — MORPHINE SULFATE (PF) 2 MG/ML IV SOLN
2.0000 mg | INTRAVENOUS | Status: DC | PRN
  Administered 2017-01-28: 2 mg via INTRAVENOUS
  Filled 2017-01-28: qty 1

## 2017-01-28 MED ORDER — CEFAZOLIN SODIUM-DEXTROSE 2-4 GM/100ML-% IV SOLN
INTRAVENOUS | Status: AC
  Filled 2017-01-28: qty 100

## 2017-01-28 MED ORDER — TRAMADOL HCL 50 MG PO TABS: 50.0000 mg | ORAL_TABLET | Freq: Four times a day (QID) | ORAL | 0 refills | Status: DC | PRN

## 2017-01-28 MED ORDER — OXYCODONE HCL 5 MG/5ML PO SOLN: 5.0000 mg | Freq: Once | ORAL | Status: DC | PRN

## 2017-01-28 MED ORDER — OXYCODONE HCL 5 MG PO TABS: 5.0000 mg | ORAL_TABLET | Freq: Once | ORAL | Status: DC | PRN

## 2017-01-28 MED ORDER — BELLADONNA ALKALOIDS-OPIUM 16.2-60 MG RE SUPP
1.0000 | Freq: Three times a day (TID) | RECTAL | Status: DC | PRN
  Administered 2017-01-28: 1 via RECTAL
  Filled 2017-01-28: qty 1

## 2017-01-28 MED ORDER — LIDOCAINE 2% (20 MG/ML) 5 ML SYRINGE
INTRAMUSCULAR | Status: AC
  Filled 2017-01-28: qty 5

## 2017-01-28 MED ORDER — EPHEDRINE SULFATE-NACL 50-0.9 MG/10ML-% IV SOSY
PREFILLED_SYRINGE | INTRAVENOUS | Status: DC | PRN
  Administered 2017-01-28 (×3): 5 mg via INTRAVENOUS

## 2017-01-28 MED ORDER — LEVOFLOXACIN IN D5W 500 MG/100ML IV SOLN: 500.0000 mg | Freq: Once | INTRAVENOUS | Status: DC

## 2017-01-28 MED ORDER — SUCCINYLCHOLINE CHLORIDE 200 MG/10ML IV SOSY
PREFILLED_SYRINGE | INTRAVENOUS | Status: DC | PRN
  Administered 2017-01-28: 100 mg via INTRAVENOUS

## 2017-01-28 MED ORDER — FENTANYL CITRATE (PF) 100 MCG/2ML IJ SOLN: 25.0000 ug | INTRAMUSCULAR | Status: DC | PRN

## 2017-01-28 MED ORDER — PROPOFOL 10 MG/ML IV BOLUS
INTRAVENOUS | Status: DC | PRN
  Administered 2017-01-28: 200 mg via INTRAVENOUS

## 2017-01-28 MED ORDER — DEXAMETHASONE SODIUM PHOSPHATE 10 MG/ML IJ SOLN
INTRAMUSCULAR | Status: DC | PRN
  Administered 2017-01-28: 6 mg via INTRAVENOUS

## 2017-01-28 MED ORDER — INSULIN ASPART 100 UNIT/ML ~~LOC~~ SOLN: 0.0000 [IU] | Freq: Every day | SUBCUTANEOUS | Status: DC

## 2017-01-28 MED ORDER — SENNA 8.6 MG PO TABS
1.0000 | ORAL_TABLET | Freq: Two times a day (BID) | ORAL | Status: DC
  Administered 2017-01-28 (×2): 8.6 mg via ORAL
  Filled 2017-01-28 (×2): qty 1

## 2017-01-28 MED ORDER — ONDANSETRON HCL 4 MG/2ML IJ SOLN: 4.0000 mg | Freq: Four times a day (QID) | INTRAMUSCULAR | Status: DC | PRN

## 2017-01-28 MED ORDER — LIDOCAINE 2% (20 MG/ML) 5 ML SYRINGE
INTRAMUSCULAR | Status: DC | PRN
Start: 2017-01-28 — End: 2017-01-28
  Administered 2017-01-28: 50 mg via INTRAVENOUS

## 2017-01-28 MED ORDER — EPHEDRINE 5 MG/ML INJ
INTRAVENOUS | Status: AC
  Filled 2017-01-28: qty 10

## 2017-01-28 MED ORDER — OXYCODONE-ACETAMINOPHEN 5-325 MG PO TABS: 1.0000 | ORAL_TABLET | ORAL | 0 refills | Status: DC | PRN

## 2017-01-28 MED ORDER — ACETAMINOPHEN 500 MG PO TABS
1000.0000 mg | ORAL_TABLET | Freq: Four times a day (QID) | ORAL | Status: AC
  Administered 2017-01-28 – 2017-01-29 (×4): 1000 mg via ORAL
  Filled 2017-01-28 (×4): qty 2

## 2017-01-28 MED ORDER — OXYCODONE HCL 5 MG PO TABS: 5.0000 mg | ORAL_TABLET | ORAL | Status: DC | PRN

## 2017-01-28 MED ORDER — SUGAMMADEX SODIUM 500 MG/5ML IV SOLN
INTRAVENOUS | Status: DC | PRN
  Administered 2017-01-28: 400.8 mg via INTRAVENOUS

## 2017-01-28 MED ORDER — CIPROFLOXACIN IN D5W 400 MG/200ML IV SOLN
400.0000 mg | INTRAVENOUS | Status: AC
  Administered 2017-01-28: 400 mg via INTRAVENOUS

## 2017-01-28 MED ORDER — LEVOTHYROXINE SODIUM 112 MCG PO TABS
112.0000 ug | ORAL_TABLET | Freq: Every day | ORAL | Status: DC
  Administered 2017-01-29: 112 ug via ORAL
  Filled 2017-01-28: qty 1

## 2017-01-28 MED ORDER — PHENYLEPHRINE 40 MCG/ML (10ML) SYRINGE FOR IV PUSH (FOR BLOOD PRESSURE SUPPORT)
PREFILLED_SYRINGE | INTRAVENOUS | Status: AC
Start: 2017-01-28 — End: 2017-01-28
  Filled 2017-01-28: qty 10

## 2017-01-28 MED ORDER — CEFAZOLIN SODIUM-DEXTROSE 2-4 GM/100ML-% IV SOLN
2.0000 g | INTRAVENOUS | Status: AC
  Administered 2017-01-28: 2 g via INTRAVENOUS

## 2017-01-28 MED ORDER — MIDAZOLAM HCL 5 MG/5ML IJ SOLN
INTRAMUSCULAR | Status: DC | PRN
  Administered 2017-01-28: 2 mg via INTRAVENOUS

## 2017-01-28 MED ORDER — SUCCINYLCHOLINE CHLORIDE 200 MG/10ML IV SOSY
PREFILLED_SYRINGE | INTRAVENOUS | Status: AC
  Filled 2017-01-28: qty 10

## 2017-01-28 MED ORDER — LACTATED RINGERS IV SOLN
INTRAVENOUS | Status: DC | PRN
  Administered 2017-01-28 (×2): via INTRAVENOUS

## 2017-01-28 MED ORDER — SODIUM CHLORIDE 0.9 % IR SOLN
Status: DC | PRN
  Administered 2017-01-28: 12000 mL

## 2017-01-28 MED ORDER — FENTANYL CITRATE (PF) 250 MCG/5ML IJ SOLN
INTRAMUSCULAR | Status: AC
  Filled 2017-01-28: qty 5

## 2017-01-28 MED ORDER — IOPAMIDOL (ISOVUE-300) INJECTION 61%
INTRAVENOUS | Status: DC | PRN
  Administered 2017-01-28: 25 mL

## 2017-01-28 MED ORDER — MIDAZOLAM HCL 2 MG/2ML IJ SOLN
INTRAMUSCULAR | Status: AC
  Filled 2017-01-28: qty 2

## 2017-01-28 MED ORDER — CIPROFLOXACIN IN D5W 400 MG/200ML IV SOLN
INTRAVENOUS | Status: AC
  Filled 2017-01-28: qty 200

## 2017-01-28 MED ORDER — ONDANSETRON HCL 4 MG/2ML IJ SOLN
4.0000 mg | INTRAMUSCULAR | Status: DC | PRN
  Administered 2017-01-28: 4 mg via INTRAVENOUS
  Filled 2017-01-28: qty 2

## 2017-01-28 MED ORDER — BUPIVACAINE HCL (PF) 0.5 % IJ SOLN
INTRAMUSCULAR | Status: DC | PRN
  Administered 2017-01-28: 30 mL

## 2017-01-28 MED ORDER — FENTANYL CITRATE (PF) 100 MCG/2ML IJ SOLN
INTRAMUSCULAR | Status: DC | PRN
  Administered 2017-01-28: 100 ug via INTRAVENOUS
  Administered 2017-01-28 (×3): 50 ug via INTRAVENOUS

## 2017-01-28 MED ORDER — ONDANSETRON HCL 4 MG/2ML IJ SOLN
INTRAMUSCULAR | Status: DC | PRN
  Administered 2017-01-28: 4 mg via INTRAVENOUS

## 2017-01-28 MED ORDER — ROCURONIUM BROMIDE 50 MG/5ML IV SOSY
PREFILLED_SYRINGE | INTRAVENOUS | Status: AC
  Filled 2017-01-28: qty 10

## 2017-01-28 SURGICAL SUPPLY — 57 items
APL SKNCLS STERI-STRIP NONHPOA (GAUZE/BANDAGES/DRESSINGS) ×1
BAG URINE DRAINAGE (UROLOGICAL SUPPLIES) ×4 IMPLANT
BASKET STNLS GEMINI 4WIRE 3FR (BASKET) IMPLANT
BASKET STONE NCOMPASS (UROLOGICAL SUPPLIES) ×2 IMPLANT
BASKET ZERO TIP NITINOL 2.4FR (BASKET) ×2 IMPLANT
BENZOIN TINCTURE PRP APPL 2/3 (GAUZE/BANDAGES/DRESSINGS) ×4 IMPLANT
BLADE SURG 15 STRL LF DISP TIS (BLADE) ×1 IMPLANT
BLADE SURG 15 STRL SS (BLADE) ×3
BSKT STON RTRVL GEM 120X11 3FR (BASKET)
BSKT STON RTRVL ZERO TP 2.4FR (BASKET) ×1
CATCHER STONE W/TUBE ADAPTER (UROLOGICAL SUPPLIES) IMPLANT
CATH AINSWORTH 30CC 24FR (CATHETERS) ×2 IMPLANT
CATH FOLEY 2WAY SLVR  5CC 16FR (CATHETERS)
CATH FOLEY 2WAY SLVR 5CC 16FR (CATHETERS) ×1 IMPLANT
CATH IMAGER II 65CM (CATHETERS) ×1 IMPLANT
CATH URET 5FR 28IN OPEN ENDED (CATHETERS) ×1 IMPLANT
CATH URET DUAL LUMEN 6-10FR 50 (CATHETERS) ×2 IMPLANT
CATH X-FORCE N30 NEPHROSTOMY (TUBING) ×3 IMPLANT
CHLORAPREP W/TINT 26ML (MISCELLANEOUS) ×3 IMPLANT
COVER SURGICAL LIGHT HANDLE (MISCELLANEOUS) ×1 IMPLANT
DRAPE C-ARM 42X120 X-RAY (DRAPES) ×3 IMPLANT
DRAPE LINGEMAN PERC (DRAPES) ×3 IMPLANT
DRSG PAD ABDOMINAL 8X10 ST (GAUZE/BANDAGES/DRESSINGS) ×2 IMPLANT
DRSG TEGADERM 8X12 (GAUZE/BANDAGES/DRESSINGS) ×4 IMPLANT
FIBER LASER FLEXIVA 1000 (UROLOGICAL SUPPLIES) IMPLANT
FIBER LASER FLEXIVA 365 (UROLOGICAL SUPPLIES) IMPLANT
FIBER LASER FLEXIVA 550 (UROLOGICAL SUPPLIES) IMPLANT
FIBER LASER TRAC TIP (UROLOGICAL SUPPLIES) ×2 IMPLANT
GAUZE SPONGE 4X4 12PLY STRL (GAUZE/BANDAGES/DRESSINGS) ×3 IMPLANT
GLOVE BIOGEL M STRL SZ7.5 (GLOVE) ×5 IMPLANT
GOWN STRL REUS W/TWL XL LVL3 (GOWN DISPOSABLE) ×3 IMPLANT
GUIDEWIRE AMPLAZ .035X145 (WIRE) ×6 IMPLANT
GUIDEWIRE ANG ZIPWIRE 038X150 (WIRE) IMPLANT
GUIDEWIRE STR DUAL SENSOR (WIRE) ×3 IMPLANT
INSERT SILICONE FOR G14464 (MISCELLANEOUS) ×1 IMPLANT
IV SET EXTENSION CATH 6 NF (IV SETS) ×1 IMPLANT
KIT BASIN OR (CUSTOM PROCEDURE TRAY) ×3 IMPLANT
MANIFOLD NEPTUNE II (INSTRUMENTS) ×3 IMPLANT
NDL TROCAR 18X15 ECHO (NEEDLE) IMPLANT
NDL TROCAR 18X20 (NEEDLE) IMPLANT
NEEDLE TROCAR 18X15 ECHO (NEEDLE) IMPLANT
NEEDLE TROCAR 18X20 (NEEDLE) IMPLANT
NS IRRIG 1000ML POUR BTL (IV SOLUTION) ×3 IMPLANT
PACK CYSTO (CUSTOM PROCEDURE TRAY) ×3 IMPLANT
PROBE LITHOCLAST ULTRA 3.8X403 (UROLOGICAL SUPPLIES) ×2 IMPLANT
PROBE PNEUMATIC 1.0MMX570MM (UROLOGICAL SUPPLIES) ×2 IMPLANT
SHEATH PEELAWAY SET 9 (SHEATH) ×3 IMPLANT
SPONGE LAP 4X18 X RAY DECT (DISPOSABLE) ×3 IMPLANT
STENT URET 6FRX24 CONTOUR (STENTS) ×2 IMPLANT
STONE CATCHER W/TUBE ADAPTER (UROLOGICAL SUPPLIES) IMPLANT
SUT ETHILON 2 0 PS N (SUTURE) ×1 IMPLANT
SYR 10ML LL (SYRINGE) ×3 IMPLANT
SYR 20CC LL (SYRINGE) ×6 IMPLANT
SYR 50ML LL SCALE MARK (SYRINGE) ×3 IMPLANT
TOWEL OR 17X26 10 PK STRL BLUE (TOWEL DISPOSABLE) ×3 IMPLANT
TUBING CONNECTING 10 (TUBING) ×4 IMPLANT
TUBING CONNECTING 10' (TUBING) ×2

## 2017-01-28 NOTE — Op Note (Addendum)
Pre-operative diagnosis: left ureteral and lower pole stones,> 2.0 centimeters  Post-operative diagnosis: as above   Procedure performed: Anterograde nephrostogram with interpretation, dilation of previously obtained renal access, percutaneous nephrolithotomy greater than 2.0 centimeters, antegrade ureteroscopy with laser lithotripsy, placement of a 24 cm X 6 Pierron double-J ureteral stent, completion antegrade nephrostogram with interpretation  Surgeon: Dr. Ardis Hughs  Assistant: Dr. Virginia Crews, M.D.  Anesthesia: General  Complications: None  Specimens: Several small fragments of the stone extracted will be sent to [facility] for stone composition analysis  Findings: 1. Antegrade nephrostogram demonstrated a filling defect in the left lower pole as expected with the patient's known stone burden in the region, she also had a large proximal filling defect in the ureter. 2. The stone in the proximal ureter was fully obstructed/impacted and required laser fragmentation in order to remove the stones through the percutaneous renal access. 3. Ureteroscopy was performed once all the stones had been fragmented and removed and any additional fragments were either push and a bladder removed with the basket from the percutaneous renal access tract. 4. A 24 cm times 6 Emery double-J ureteral stent was placed under fluoroscopic guidance in the antegrade fashion. 5. A completion nephrostogram was performed using 30 mL of a thick contrast which demonstrated no contrast extravasation and no additional filling defects.  EBL: Approximately 100 cc  Specimens: stone from collecting system - taken to [facility] lab  Indication: Tracy Graves is a 56 y.o. patient with  a large 2.5 centimeter left proximal ureteral stone that was obstructing and causing significant hydronephrosis. She was initially seen in the emergency room and a nephrostomy tube was placed. She presented today  for follow-up definitive management. After  reviewing the management options for treatment, he elected to proceed with the above surgical procedure(s). We have discussed the potential benefits and risks of the procedure, side effects of the proposed treatment, the likelihood of the patient achieving the goals of the procedure, and any potential problems that might occur during the procedure or recuperation. Informed consent has been obtained.   Description:  Consent was obtained in the preoperative holding area. The patient  was marked appropriately and then taken back to the operating room where she was intubated on the gurney. The patient was flipped prone onto the OR table. Large jelly rolls were placed in the anterior axillary line on both sides allowing the patient's chest and abdomen to fall inbetween. The patient was then prepped and draped in the routine sterile fashion in the  flank. A timeout was then held confirming the proper side and procedure as well as antibiotics were administered.  I then injected contrast through the previously placed nephrostomy tube with the above notable findings. The excess was deemed to be suitable for dilation. Using the C-arm for fluoroscopic guidance I advanced a  0.038 sensor wire through the nephrostomy tube and then able to remove the nephrostomy tube over the wire without complication. I was unable to advance the 0.038 sensor wire down into the collecting system and into the bladder under fluoroscopic guidance.  An angiographic catheter was then advanced into the bladder and the wire removed. A Super Stiff wire was then passed into the angiographic catheter and the angiographic catheter removed. A 9 Loredo peel-away dilator was then advanced over the Super Stiff wire and passed into the renal pelvis and across the UPJ under fluoroscopic guidance. The inner part of this dilator was removed and the 0.38 sensor wire  was passed alongside the Super Stiff wire through  the dilator and into the left  ureter and down into the bladder. The angiographic catheter again was passed over the guidewire and advanced into the bladder, the wire was then removed. A Super Stiff wire was then passed through angiographic catheter and angiographic catheter removed. The outer part of the sheath was then removed, establishing 2 superstiff wires through the lower pole calyx and into the bladder.   The 81 Mcraney NephroMax balloon was then passed over one of the Super Stiff wires and the tip guided down into the left  Lower pole calyx. The balloon was then inflated to approximately 12 atm, and once there was no waist noted under fluoroscopy the access sheath was advanced over the balloon. The balloon was then removed. The wires were then placed back into the sheaths and snapped to the drape.   Once into the collecting system inspection of the renal pelvis noted no stone fragments. We then opted to proceed using a flexible cystoscope which were able to get into the proximal ureter without difficulty. I was unable to basket the stone and at this point decided to fragment the stone using a 200  fiber and settings between 0.6 and 1.0 joules times 6-10 Hz. The stone was fragmented and numerous smaller pieces which I was unable to retrieve using a 0 tipped nitinol basket. Several large fragments as well as some of the sediment was pulled her pushed into the renal pelvis which we then evacuated using the lithoclast.  Then using the flexible ureteroscope the ureter was navigated in antegrade fashion. All stone fragments were pushed from the ureter into the bladder. Once the ureter was clear the scope was advanced into the bladder and a 0.038 sensor wire was left in the bladder and the scope backed out over wire. The sensor wire was then backloaded over the rigid nephroscope using the stent pusher and a 24 cm x 6 Valbuena double-J ureteral stent was passed antegrade over the sensor wire down into the  bladder under fluoroscopic guidance. Once the stent was in the bladder the wire was gently pulled back and a nice curl noted in the bladder. The wire completely removed from the stent, and nice curl on the proximal end of the stent was noted in the renal pelvis. The sheath was then backed out slowly to ensure that all calyces had been inspected and there was nothing behind the sheath.   A 24 [place]  Ainsworth catheter was then passed over one of the Super Stiff wires through the sheath and into the renal pelvis. The sheath was then backed out of the kidney and cut off the red rubber catheter. A nephrostogram was then performed confirming the position of our nephrostomy tube and reassuring that there were no longer any filling defects from the patient's symptoms. in addition, there is no extravasation or evidence of perforation of the collecting system.  After several minutes of direct pressure and observation was noted that there was no significant bleeding from the nephrostomy tube or around the nephrostomy tube tract. As such, I remove the nephrostomy tube as well as the safety wire. 25 cc of local anesthesia was then injected into the patient's wound, and the wound was closed with 3-0 nylon in 2 vertical mattress sutures. The incision was then padded using a bundle of 4 x 4's and Hypafix tape. Patient was subsequently rolled over to the supine position and extubated. She was returned to the PACU  in excellent condition. At the end of the case all lap and needle and sponges were accounted for. There are no perioperative complications.

## 2017-01-28 NOTE — Interval H&P Note (Signed)
History and Physical Interval Note:  01/28/2017 5:17 AM  Tracy Graves  has presented today for surgery, with the diagnosis of LEFT URETERAL- KIDNEY STONE  The various methods of treatment have been discussed with the patient and family. After consideration of risks, benefits and other options for treatment, the patient has consented to  Procedure(s): LEFT NEPHROLITHOTOMY PERCUTANEOUS (Left) as a surgical intervention .  The patient's history has been reviewed, patient examined, no change in status, stable for surgery.  I have reviewed the patient's chart and labs.  Questions were answered to the patient's satisfaction.     Berniece SalinesHERRICK, BENJAMIN W

## 2017-01-28 NOTE — OR Nursing (Signed)
Stone taken by Dr. Herrick 

## 2017-01-28 NOTE — Progress Notes (Signed)
Unable to collect urine for lab patient dropped cup in toilet

## 2017-01-28 NOTE — Anesthesia Procedure Notes (Signed)
Procedure Name: Intubation Date/Time: 01/28/2017 7:41 AM Performed by: Teonia Yager, Virgel Gess Pre-anesthesia Checklist: Patient identified, Emergency Drugs available, Suction available, Patient being monitored and Timeout performed Patient Re-evaluated:Patient Re-evaluated prior to inductionOxygen Delivery Method: Circle system utilized Preoxygenation: Pre-oxygenation with 100% oxygen Intubation Type: IV induction Ventilation: Mask ventilation without difficulty Laryngoscope Size: Mac and 4 Grade View: Grade II Tube type: Oral Tube size: 7.5 mm Number of attempts: 1 Airway Equipment and Method: Stylet Placement Confirmation: ETT inserted through vocal cords under direct vision,  positive ETCO2,  CO2 detector and breath sounds checked- equal and bilateral Secured at: 22 cm Tube secured with: Tape Dental Injury: Teeth and Oropharynx as per pre-operative assessment

## 2017-01-28 NOTE — Anesthesia Procedure Notes (Deleted)
Spinal  Patient location during procedure: OR Staffing Resident/CRNA: Haitham Dolinsky Performed: resident/CRNA  Spinal Block Patient position: sitting Prep: Betadine Patient monitoring: heart rate, cardiac monitor, continuous pulse ox and blood pressure Injection technique: single-shot Needle Needle type: Spinocan  Needle gauge: 22 G

## 2017-01-28 NOTE — Anesthesia Postprocedure Evaluation (Signed)
Anesthesia Post Note  Patient: Tracy Graves  Procedure(s) Performed: Procedure(s) (LRB): LEFT NEPHROLITHOTOMY PERCUTANEOUS (Left)  Patient location during evaluation: PACU Anesthesia Type: General Level of consciousness: awake and alert and patient cooperative Pain management: pain level controlled Vital Signs Assessment: post-procedure vital signs reviewed and stable Respiratory status: spontaneous breathing and respiratory function stable Cardiovascular status: stable Anesthetic complications: no       Last Vitals:  Vitals:   01/28/17 1145 01/28/17 1204  BP: 112/79 (!) 137/92  Pulse: 85 81  Resp: (!) 22 15  Temp:  36.6 C    Last Pain:  Vitals:   01/28/17 1145  TempSrc:   PainSc: 0-No pain                 Breckyn Troyer S

## 2017-01-28 NOTE — Transfer of Care (Signed)
Immediate Anesthesia Transfer of Care Note  Patient: Tracy PieriniLydia F Beretta  Procedure(s) Performed: Procedure(s): LEFT NEPHROLITHOTOMY PERCUTANEOUS (Left)  Patient Location: PACU  Anesthesia Type:General  Level of Consciousness:  sedated, patient cooperative and responds to stimulation  Airway & Oxygen Therapy:Patient Spontanous Breathing and Patient connected to face mask oxgen  Post-op Assessment:  Report given to PACU RN and Post -op Vital signs reviewed and stable  Post vital signs:  Reviewed and stable  Last Vitals:  Vitals:   01/28/17 0538  BP: 120/83  Pulse: 95  Resp: 20  Temp: 36.6 C    Complications: No apparent anesthesia complications

## 2017-01-28 NOTE — Anesthesia Preprocedure Evaluation (Signed)
Anesthesia Evaluation  Patient identified by MRN, date of birth, ID band Patient awake    Reviewed: Allergy & Precautions, H&P , NPO status , Patient's Chart, lab work & pertinent test results  Airway Mallampati: II   Neck ROM: full    Dental   Pulmonary Current Smoker,    breath sounds clear to auscultation       Cardiovascular hypertension,  Rhythm:regular Rate:Normal     Neuro/Psych    GI/Hepatic   Endo/Other  diabetes, Type 2Hypothyroidism Morbid obesity  Renal/GU      Musculoskeletal   Abdominal   Peds  Hematology   Anesthesia Other Findings   Reproductive/Obstetrics                             Anesthesia Physical Anesthesia Plan  ASA: II  Anesthesia Plan: General   Post-op Pain Management:    Induction: Intravenous  Airway Management Planned: Oral ETT  Additional Equipment:   Intra-op Plan:   Post-operative Plan: Extubation in OR  Informed Consent: I have reviewed the patients History and Physical, chart, labs and discussed the procedure including the risks, benefits and alternatives for the proposed anesthesia with the patient or authorized representative who has indicated his/her understanding and acceptance.     Plan Discussed with: CRNA, Anesthesiologist and Surgeon  Anesthesia Plan Comments:         Anesthesia Quick Evaluation

## 2017-01-28 NOTE — H&P (Signed)
History of present illness: This 56 year old female who is presented to the emergency department today with several days of nausea, vomiting, and left groin pain. She was seen 3 days prior and treated for urinary tract infection. Her symptoms progressed with worsening nausea and vomiting. She denies any fevers or flank pain. She's not had any gross hematuria. She has had urinary frequency and urgency but no dysuria. The patient states that her pain radiates down into her vagina. In the emergency department she was noted to have an elevated white blood cell count of 15.1K with a GFR of 49, baseline is greater than 60.  A CT scan of the abdomen and pelvis demonstrated a 2.4 centimeter left UPJ stone with several large nonobstructing stones in the left kidney, the largest one measuring 1.6 centimeters. There is associated hydronephrosis.      The patient's past medical history is consistent with diabetes, well-controlled present history. She also has a history of tobacco abuse. She has no history of heart attack or stroke. She has no associated manifestations of her diabetes.  Review of systems: A 12 point comprehensive review of systems was obtained and is negative unless otherwise stated in the history of present illness.      Patient Active Problem List   Diagnosis Date Noted  . Hydronephrosis 12/23/2016  . UTI (urinary tract infection) 12/23/2016  . AKI (acute kidney injury) (HCC) 12/23/2016  . Essential hypertension 12/23/2016  . Hyponatremia 12/23/2016  . Hypothyroidism, acquired 06/07/2015  . Abscess of chest wall   . Morbid obesity due to excess calories (HCC) 06/05/2015  . Tobacco abuse 06/05/2015  . Type 2 diabetes mellitus without complication, without long-term current use of insulin (HCC) 06/05/2015    No current facility-administered medications on file prior to encounter.          Current Outpatient Prescriptions on File Prior to Encounter  Medication Sig Dispense  Refill  . levothyroxine (SYNTHROID, LEVOTHROID) 112 MCG tablet Take 112 mcg by mouth daily before breakfast.    . lisinopril (PRINIVIL,ZESTRIL) 10 MG tablet Take 10 mg by mouth daily.     . metFORMIN (GLUCOPHAGE) 500 MG tablet Take by mouth 2 (two) times daily with a meal.    . nicotine (NICODERM CQ - DOSED IN MG/24 HOURS) 21 mg/24hr patch Place 1 patch (21 mg total) onto the skin daily. (Patient not taking: Reported on 12/21/2016) 28 patch 0        Past Medical History:  Diagnosis Date  . Abscess    chest wall, right breast  . Cellulitis   . Hyperglycemia due to type 2 diabetes mellitus (HCC) 06/05/2015  . Hypertension   . Hypothyroid    2016  . Intertrigo   . Morbid obesity due to excess calories (HCC) 06/05/2015  . Tobacco use     History reviewed. No pertinent surgical history.  Social History  Substance Use Topics  . Smoking status: Current Every Day Smoker    Packs/day: 1.00    Types: Cigarettes  . Smokeless tobacco: Current User  . Alcohol use No         Family History  Problem Relation Age of Onset  . Breast cancer Mother   . Leukemia Maternal Grandfather   . Ovarian cancer Maternal Aunt     PE:       Vitals:   12/23/16 0430 12/23/16 0506 12/23/16 0530 12/23/16 0600  BP: 139/86 138/87 139/90 136/84  Pulse: 82 79 83 77  Resp: 18 15 24  21  Temp:      TempSrc:      SpO2: 96% 96% 96% 95%   Patient appears to be in no acute distress  patient is alert and oriented x3 Atraumatic normocephalic head No cervical or supraclavicular lymphadenopathy appreciated No increased work of breathing, no audible wheezes/rhonchi Regular sinus rhythm/rate Abdomen is soft, nontender, nondistended, minimally tender in her left CVA Lower extremities are symmetric without appreciable edema Grossly neurologically intact No identifiable skin lesions   RecentLabs(last2labs)   Recent Labs  12/23/16 0109  WBC 15.1*  HGB 15.3*   HCT 43.8      RecentLabs(last2labs)   Recent Labs  12/23/16 0109  NA 133*  K 4.2  CL 102  CO2 21*  GLUCOSE 123*  BUN 11  CREATININE 1.38*  CALCIUM 10.6*     RecentLabs(last2labs)  No results for input(s): LABPT, INR in the last 72 hours.   RecentLabs(last2labs)  No results for input(s): LABURIN in the last 72 hours.          Results for orders placed or performed during the hospital encounter of 08/08/15  Culture, routine-abscess     Status: None   Collection Time: 08/08/15 11:45 PM  Result Value Ref Range Status   Specimen Description ABSCESS LEFT AXILLA  Final   Special Requests NONE  Final   Gram Stain   Final    ABUNDANT WBC PRESENT,BOTH PMN AND MONONUCLEAR RARE SQUAMOUS EPITHELIAL CELLS PRESENT FEW GRAM NEGATIVE RODS FEW GRAM POSITIVE COCCI IN PAIRS    Culture   Final    MODERATE LACTOBACILLUS SPECIES Note: Standardized susceptibility testing for this organism is not available. Performed at [facility]    Report Status 08/12/2015 FINAL  Final    Imaging: I have independently reviewed the patient's CT scan which demonstrates a 2.4 centimeter left UVJ stone with several large obstructing stones in the renal pelvis and associate hydronephrosis.  Imp: The patient has a large obstructing stone at the left UVJ and clinical evidence of a urinary tract infection. She is surprisingly fairly comfortable and nontoxic appearing.  Recommendations: I went over the patient's circumstance with her and her family in quite a bit of detail. We discussed management strategies and ultimately I recommended the patient undergo a left percutaneous nephrostomy tube. Once the tube is in place she will need to be treated for 2 weeks with appropriate, culture driven antibiotics. I will plan to take the patient to the operating room once she has been adequately treated for infection for definitive management of her stones.  We then  discussed the treatment for stones which would be a left percutaneous nephrolithotomy. I described the surgery for the patient in detail. She understands that the procedure requires general anesthesia and she would be in the prone position. We would perform the surgery through the nephrostomy tube track. Following the operation the patient will have a ureteral stent. She will be admitted for at least 1 night. She understands that she is likely to require second look procedure as well. I discussed the risks and the benefits of this operation in significant detail for the patient. Having gone through the whole treatment process the patient has opted to proceed.  I will get this scheduled for her over the next few days. My office will contact her with the surgery times a day.

## 2017-01-29 LAB — BASIC METABOLIC PANEL
Anion gap: 7 (ref 5–15)
BUN: 11 mg/dL (ref 6–20)
CALCIUM: 10.2 mg/dL (ref 8.9–10.3)
CHLORIDE: 101 mmol/L (ref 101–111)
CO2: 26 mmol/L (ref 22–32)
CREATININE: 1.04 mg/dL — AB (ref 0.44–1.00)
GFR calc non Af Amer: 59 mL/min — ABNORMAL LOW (ref >60)
GLUCOSE: 108 mg/dL — AB (ref 65–99)
Potassium: 4.7 mmol/L (ref 3.5–5.1)
Sodium: 134 mmol/L — ABNORMAL LOW (ref 135–145)

## 2017-01-29 LAB — HEMOGLOBIN AND HEMATOCRIT, BLOOD
HCT: 40.1 % (ref 36.0–46.0)
Hemoglobin: 13.6 g/dL (ref 12.0–15.0)

## 2017-01-29 LAB — GLUCOSE, CAPILLARY: Glucose-Capillary: 104 mg/dL — ABNORMAL HIGH (ref 65–99)

## 2017-01-29 MED ORDER — TRAMADOL HCL 50 MG PO TABS: 50.0000 mg | ORAL_TABLET | Freq: Four times a day (QID) | ORAL | Status: DC | PRN

## 2017-01-29 NOTE — Discharge Summary (Signed)
Date of admission: 01/28/2017  Date of discharge: 01/29/2017  Admission diagnosis: Left renal stone  Discharge diagnosis: Left renal stone  History and Physical: For full details, please see admission history and physical. Briefly, Tracy Graves is a 56 y.o. female with left renal stone.  After discussing management/treatment options, she elected to proceed with surgical treatment.  Hospital Course: Tracy Graves was taken to the operating room on 01/28/2017 and underwent a left percutaneous nephrolithotomy. She tolerated this procedure well and without complications. Postoperatively, she was able to be transferred to a regular hospital room following recovery from anesthesia.  She remained hemodynamically stable overnight.  She had excellent urine output and her foley catheter was removed on POD #1. CT scan demonstrated no residual stone burden. She was transitioned to oral pain medication, regular diet, and had met all discharge criteria and was able to be discharged home later on POD#1.  Laboratory values:  Recent Labs  01/28/17 0600 01/28/17 1126 01/29/17 0458  HGB 15.2* 14.5 13.6  HCT 44.0 42.9 40.1    Disposition: Home  Discharge instruction: She was instructed to be ambulatory but to refrain from heavy lifting, strenuous activity, or driving.   Discharge medications:  Allergies as of 01/29/2017      Reactions   Other Rash   all nuts      Medication List    STOP taking these medications   cephALEXin 500 MG capsule Commonly known as:  KEFLEX   methocarbamol 500 MG tablet Commonly known as:  ROBAXIN     TAKE these medications   levothyroxine 112 MCG tablet Commonly known as:  SYNTHROID, LEVOTHROID Take 112 mcg by mouth daily before breakfast.   lisinopril 10 MG tablet Commonly known as:  PRINIVIL,ZESTRIL Take 10 mg by mouth every morning.   metFORMIN 500 MG tablet Commonly known as:  GLUCOPHAGE Take 500 mg by mouth 2 (two) times daily with a meal. What changed:   Another medication with the same name was removed. Continue taking this medication, and follow the directions you see here.   nicotine 21 mg/24hr patch Commonly known as:  NICODERM CQ - dosed in mg/24 hours Place 1 patch (21 mg total) onto the skin daily.   polyethylene glycol packet Commonly known as:  MIRALAX / GLYCOLAX Take 17 g by mouth daily as needed for mild constipation.   traMADol 50 MG tablet Commonly known as:  ULTRAM Take 1 tablet (50 mg total) by mouth every 6 (six) hours as needed.       Followup: She will followup for stent removal in clinic.

## 2017-01-29 NOTE — Progress Notes (Signed)
RN will remove foley as per order.

## 2017-01-30 ENCOUNTER — Encounter (HOSPITAL_COMMUNITY): Payer: Self-pay | Admitting: Urology

## 2017-08-15 NOTE — Congregational Nurse Program (Signed)
Congregational Nurse Program Note  Date of Encounter: 08/07/2017  Past Medical History: Past Medical History:  Diagnosis Date  . Abscess    chest wall, right breast  . Cellulitis   . History of kidney stones   . Hyperglycemia due to type 2 diabetes mellitus (HCC) 06/05/2015  . Hypertension   . Hypothyroid    2016  . Intertrigo   . Morbid obesity due to excess calories (HCC) 06/05/2015  . Tobacco use     Encounter Details:     CNP Questionnaire - 08/14/17 1100      Patient Demographics   Is this a new or existing patient? New   Patient is considered a/an Not Applicable   Race Caucasian/White     Patient Assistance   Location of Patient Assistance [place]   Patient's financial/insurance status Self-Pay (Uninsured)   Uninsured Patient ([place] [employer]s) No   Food insecurities addressed Not Technical brewerApplicable   Transportation assistance No   Assistance securing medications No   Educational health offerings Chronic disease;Behavioral health     Encounter Details   Primary purpose of visit Chronic Illness/Condition Visit;Navigating the Healthcare System   Was an Emergency Department visit averted? Not Applicable   Does patient have a medical provider? Yes   Patient referred to Follow up with established PCP   Was a mental health screening completed? (GAINS tool) No   Does patient have dental issues? No   Does patient have vision issues? No   Does your patient have an abnormal blood pressure today? No   Since previous encounter, have you referred patient for abnormal blood pressure that resulted in a new diagnosis or medication change? No   Does your patient have an abnormal blood glucose today? No   Since previous encounter, have you referred patient for abnormal blood glucose that resulted in a new diagnosis or medication change? No   Was there a life-saving intervention made? No    Client assisted with referral form for scheduled appointment for 6 month followup  [facility]. Appointment scheduled 8/20//18  9:30.   BP 118/88 P- 94. Pearletha AlfredJan Akiera Allbaugh,RN [phone].

## 2017-10-28 ENCOUNTER — Encounter (HOSPITAL_COMMUNITY): Payer: Self-pay

## 2017-10-28 ENCOUNTER — Emergency Department (HOSPITAL_COMMUNITY)
Admission: EM | Admit: 2017-10-28 | Discharge: 2017-10-28 | Disposition: A | Discharge status: Home / Self Care | Payer: Self-pay | Attending: Emergency Medicine | Admitting: Emergency Medicine

## 2017-10-28 DIAGNOSIS — Z79899 Other long term (current) drug therapy: Secondary | ICD-10-CM | POA: Insufficient documentation

## 2017-10-28 DIAGNOSIS — I10 Essential (primary) hypertension: Secondary | ICD-10-CM | POA: Insufficient documentation

## 2017-10-28 DIAGNOSIS — F17210 Nicotine dependence, cigarettes, uncomplicated: Secondary | ICD-10-CM | POA: Insufficient documentation

## 2017-10-28 DIAGNOSIS — E039 Hypothyroidism, unspecified: Secondary | ICD-10-CM | POA: Insufficient documentation

## 2017-10-28 DIAGNOSIS — E119 Type 2 diabetes mellitus without complications: Secondary | ICD-10-CM | POA: Insufficient documentation

## 2017-10-28 DIAGNOSIS — L0291 Cutaneous abscess, unspecified: Secondary | ICD-10-CM

## 2017-10-28 DIAGNOSIS — Z7984 Long term (current) use of oral hypoglycemic drugs: Secondary | ICD-10-CM | POA: Insufficient documentation

## 2017-10-28 DIAGNOSIS — L02412 Cutaneous abscess of left axilla: Secondary | ICD-10-CM | POA: Insufficient documentation

## 2017-10-28 MED ORDER — DOXYCYCLINE HYCLATE 100 MG PO CAPS: 100.0000 mg | ORAL_CAPSULE | Freq: Two times a day (BID) | ORAL | 0 refills | Status: DC

## 2017-10-28 MED ORDER — LIDOCAINE HCL 1 % IJ SOLN
INTRAMUSCULAR | Status: AC
  Filled 2017-10-28: qty 20

## 2017-10-28 NOTE — ED Triage Notes (Signed)
Pt c/o increase abscess in L axilla.  Pain score 10/10 w/ palpation.  Pt reports similar abscess previously which was drained in ED.

## 2017-10-28 NOTE — ED Notes (Signed)
Pt is alert and oriented x 4 and is verbally responsive. Pt is accompanied by family friend and reports that she has an abscess under her left axilla, pt reports it appeared yesterday. Area is red and is 10/10 pain with touch. Pt denies taking any medication for pain. Pt reports that she has in the past 2 yrs ago for similar abscess under right arm.

## 2017-10-28 NOTE — ED Provider Notes (Signed)
[facility]-EMERGENCY DEPT Provider Note   CSN: [id number] Arrival date & time: 10/28/17  7829     History   Chief Complaint Chief Complaint  Patient presents with  . Abscess    HPI Tracy Graves is a 56 y.o. female.  Pt comes in with c/o abscess to the left axilla that started yesterday. She states that it was pin size yesterday and now it is huge. Has a similar history. Her sugar was 140 yesterday which is higher than normal for her.       Past Medical History:  Diagnosis Date  . Abscess    chest wall, right breast  . Cellulitis   . History of kidney stones   . Hyperglycemia due to type 2 diabetes mellitus (HCC) 06/05/2015  . Hypertension   . Hypothyroid    2016  . Intertrigo   . Morbid obesity due to excess calories (HCC) 06/05/2015  . Tobacco use     Patient Active Problem List   Diagnosis Date Noted  . Renal stone 01/28/2017  . Hydronephrosis 12/23/2016  . UTI (urinary tract infection) 12/23/2016  . AKI (acute kidney injury) (HCC) 12/23/2016  . Essential hypertension 12/23/2016  . Hyponatremia 12/23/2016  . Hypothyroidism, acquired 06/07/2015  . Abscess of chest wall   . Morbid obesity due to excess calories (HCC) 06/05/2015  . Tobacco abuse 06/05/2015  . Type 2 diabetes mellitus without complication, without long-term current use of insulin (HCC) 06/05/2015    Past Surgical History:  Procedure Laterality Date  . IR GENERIC HISTORICAL  12/23/2016   IR NEPHROSTOMY PLACEMENT LEFT 12/23/2016 Berdine Dance, MD [facility]INTER[facility]  . NEPHROLITHOTOMY Left 01/28/2017   Procedure: LEFT NEPHROLITHOTOMY PERCUTANEOUS;  Surgeon: Crist Fat, MD;  Location: [facility] ORS;  Service: Urology;  Laterality: Left;  . NEPHROSTOMY TUBE PLACEMENT ([facility] HX)  12/23/2016    OB History    No data available       Home Medications    Prior to Admission medications   Medication Sig Start Date End Date Taking? Authorizing Provider  doxycycline (VIBRAMYCIN)  100 MG capsule Take 1 capsule (100 mg total) by mouth 2 (two) times daily. 10/28/17   Teressa Lower, NP  levothyroxine (SYNTHROID, LEVOTHROID) 112 MCG tablet Take 112 mcg by mouth daily before breakfast.    [provider]  lisinopril (PRINIVIL,ZESTRIL) 10 MG tablet Take 10 mg by mouth every morning.    [provider]  metFORMIN (GLUCOPHAGE) 500 MG tablet Take 500 mg by mouth 2 (two) times daily with a meal.    [provider]  nicotine (NICODERM CQ - DOSED IN MG/24 HOURS) 21 mg/24hr patch Place 1 patch (21 mg total) onto the skin daily. Patient not taking: Reported on 12/21/2016 06/08/15   Gwenyth Bender, NP  polyethylene glycol [employer] Ethelene Hal) packet Take 17 g by mouth daily as needed for mild constipation. Patient not taking: Reported on 01/14/2017 12/24/16   Rolly Salter, MD  traMADol (ULTRAM) 50 MG tablet Take 1 tablet (50 mg total) by mouth every 6 (six) hours as needed. 01/28/17   Crist Fat, MD    Family History Family History  Problem Relation Age of Onset  . Breast cancer Mother   . Leukemia Maternal Grandfather   . Ovarian cancer Maternal Aunt     Social History Social History  Substance Use Topics  . Smoking status: Current Every Day Smoker    Packs/day: 0.50    Types: Cigarettes  .  Smokeless tobacco: Never Used  . Alcohol use No     Allergies   Other   Review of Systems Review of Systems  Constitutional: Negative.   Respiratory: Negative.   Cardiovascular: Negative.   Gastrointestinal: Negative.   Endocrine: Negative.   Neurological: Negative.      Physical Exam Updated Vital Signs BP (!) 140/93   Pulse 88   Temp 97.7 F (36.5 C)   Resp 17   SpO2 96%   Physical Exam  Constitutional: She appears well-developed and well-nourished.  Cardiovascular: Normal rate.   Pulmonary/Chest: Effort normal and breath sounds normal.  Musculoskeletal: Normal range of motion.  Neurological: She is alert.  Skin:  Large  fluctuant red area to the left axilla with warmth noted. Redness surrounding the area  Nursing note and vitals reviewed.    ED Treatments / Results  Labs (all labs ordered are listed, but only abnormal results are displayed) Labs Reviewed - No data to display  EKG  EKG Interpretation None       Radiology No results found.  Procedures .Marland Kitchen.Incision and Drainage Date/Time: 10/28/2017 12:14 PM Performed by: Teressa LowerPICKERING, Breckin Savannah Authorized by: Teressa LowerPICKERING, Toby Ayad   Consent:    Consent obtained:  Verbal   Consent given by:  Patient   Risks discussed:  Incomplete drainage   Alternatives discussed:  No treatment Location:    Type:  Abscess   Location:  Upper extremity   Upper extremity location:  Arm   Arm location:  L upper arm Pre-procedure details:    Skin preparation:  Betadine Anesthesia (see MAR for exact dosages):    Anesthesia method:  Local infiltration   Local anesthetic:  Lidocaine 1% w/o epi Procedure type:    Complexity:  Simple Procedure details:    Incision types:  Single straight   Incision depth:  Subcutaneous   Scalpel blade:  11   Wound management:  Probed and deloculated   Drainage:  Purulent   Drainage amount:  Moderate   Wound treatment:  Wound left open   Packing materials:  1/2 in gauze Post-procedure details:    Patient tolerance of procedure:  Tolerated well, no immediate complications   (including critical care time)  Medications Ordered in ED Medications  lidocaine (XYLOCAINE) 1 % (with pres) injection (not administered)     Initial Impression / Assessment and Plan / ED Course  I have reviewed the triage vital signs and the nursing notes.  Pertinent labs & imaging results that were available during my care of the patient were reviewed by me and considered in my medical decision making (see chart for details).     Wound packed and will send home on doxy due to redness around the area. Discussed return precaution and packing removal. Pt  verbalized understanding of plan.  Final Clinical Impressions(s) / ED Diagnoses   Final diagnoses:  Abscess  Abscess of left axilla    New Prescriptions New Prescriptions   DOXYCYCLINE (VIBRAMYCIN) 100 MG CAPSULE    Take 1 capsule (100 mg total) by mouth 2 (two) times daily.     Teressa LowerPickering, Calvert Charland, NP 10/28/17 1216    Shaune PollackIsaacs, [place], MD 10/29/17 1151

## 2019-04-21 NOTE — Congregational Nurse Program (Signed)
Referral form faxed to [facility] for appointment 05/08/19. Tracy Graves  [phone].

## 2019-08-20 ENCOUNTER — Telehealth: Payer: Self-pay

## 2019-08-20 NOTE — Telephone Encounter (Signed)
Called client to confirm scheduled appointment 09/08/19 at [facility].  Client is established under [facility]. Referral form faxed to [facility].  Encouraged to call if any changes, unable to keep appointment, questions or concerns.  [name]  [phone]

## 2019-10-16 ENCOUNTER — Telehealth: Payer: Self-pay

## 2019-10-16 NOTE — Telephone Encounter (Signed)
Telephone call to followup on client,  No changes noted, 3 month check up scheduled at [facility] with established provider 12/16/19 3:00.  [name]  [phone].

## 2019-10-22 ENCOUNTER — Emergency Department (HOSPITAL_COMMUNITY): Payer: Medicaid Other

## 2019-10-22 ENCOUNTER — Encounter (HOSPITAL_COMMUNITY): Payer: Self-pay | Admitting: Emergency Medicine

## 2019-10-22 ENCOUNTER — Emergency Department (HOSPITAL_COMMUNITY)
Admission: EM | Admit: 2019-10-22 | Discharge: 2019-10-23 | Disposition: A | Discharge status: Home / Self Care | Payer: Medicaid Other | Attending: Emergency Medicine | Admitting: Emergency Medicine

## 2019-10-22 ENCOUNTER — Other Ambulatory Visit: Payer: Self-pay

## 2019-10-22 DIAGNOSIS — R0609 Other forms of dyspnea: Secondary | ICD-10-CM

## 2019-10-22 DIAGNOSIS — I10 Essential (primary) hypertension: Secondary | ICD-10-CM | POA: Insufficient documentation

## 2019-10-22 DIAGNOSIS — Z79899 Other long term (current) drug therapy: Secondary | ICD-10-CM | POA: Diagnosis not present

## 2019-10-22 DIAGNOSIS — E039 Hypothyroidism, unspecified: Secondary | ICD-10-CM | POA: Insufficient documentation

## 2019-10-22 DIAGNOSIS — R0600 Dyspnea, unspecified: Secondary | ICD-10-CM | POA: Insufficient documentation

## 2019-10-22 DIAGNOSIS — F419 Anxiety disorder, unspecified: Secondary | ICD-10-CM | POA: Insufficient documentation

## 2019-10-22 DIAGNOSIS — F410 Panic disorder [episodic paroxysmal anxiety] without agoraphobia: Secondary | ICD-10-CM | POA: Diagnosis present

## 2019-10-22 DIAGNOSIS — E119 Type 2 diabetes mellitus without complications: Secondary | ICD-10-CM | POA: Insufficient documentation

## 2019-10-22 DIAGNOSIS — R05 Cough: Secondary | ICD-10-CM | POA: Insufficient documentation

## 2019-10-22 DIAGNOSIS — F17210 Nicotine dependence, cigarettes, uncomplicated: Secondary | ICD-10-CM | POA: Insufficient documentation

## 2019-10-22 DIAGNOSIS — R200 Anesthesia of skin: Secondary | ICD-10-CM | POA: Insufficient documentation

## 2019-10-22 DIAGNOSIS — Z7984 Long term (current) use of oral hypoglycemic drugs: Secondary | ICD-10-CM | POA: Diagnosis not present

## 2019-10-22 NOTE — ED Notes (Signed)
Pt also reports numbness to L face during anxiety attacks

## 2019-10-22 NOTE — ED Triage Notes (Signed)
Pt in with c/o multiple panic attacks, worse since Tuesday. States she was making her bed that day and became extremely sob, then becoming anxious. Also states dry cough and indigestion x 2 mo's. Denies any cp, states she has been having the panic attacks since Covid began

## 2019-10-23 LAB — CBC WITH DIFFERENTIAL/PLATELET
Abs Immature Granulocytes: 0.11 10*3/uL — ABNORMAL HIGH (ref 0.00–0.07)
Basophils Absolute: 0.1 10*3/uL (ref 0.0–0.1)
Basophils Relative: 1 %
Eosinophils Absolute: 0.2 10*3/uL (ref 0.0–0.5)
Eosinophils Relative: 1 %
HCT: 48.8 % — ABNORMAL HIGH (ref 36.0–46.0)
Hemoglobin: 16.2 g/dL — ABNORMAL HIGH (ref 12.0–15.0)
Immature Granulocytes: 1 %
Lymphocytes Relative: 16 %
Lymphs Abs: 2.1 10*3/uL (ref 0.7–4.0)
MCH: 31.8 pg (ref 26.0–34.0)
MCHC: 33.2 g/dL (ref 30.0–36.0)
MCV: 95.9 fL (ref 80.0–100.0)
Monocytes Absolute: 1.1 10*3/uL — ABNORMAL HIGH (ref 0.1–1.0)
Monocytes Relative: 8 %
Neutro Abs: 9.4 10*3/uL — ABNORMAL HIGH (ref 1.7–7.7)
Neutrophils Relative %: 73 %
Platelets: 443 10*3/uL — ABNORMAL HIGH (ref 150–400)
RBC: 5.09 MIL/uL (ref 3.87–5.11)
RDW: 13.7 % (ref 11.5–15.5)
WBC: 13 10*3/uL — ABNORMAL HIGH (ref 4.0–10.5)
nRBC: 0 % (ref 0.0–0.2)

## 2019-10-23 LAB — D-DIMER, QUANTITATIVE (NOT AT ARMC): D-Dimer, Quant: 0.43 ug/mL-FEU (ref 0.00–0.50)

## 2019-10-23 LAB — BASIC METABOLIC PANEL
Anion gap: 8 (ref 5–15)
BUN: 12 mg/dL (ref 6–20)
CO2: 23 mmol/L (ref 22–32)
Calcium: 10.6 mg/dL — ABNORMAL HIGH (ref 8.9–10.3)
Chloride: 105 mmol/L (ref 98–111)
Creatinine, Ser: 1.02 mg/dL — ABNORMAL HIGH (ref 0.44–1.00)
GFR calc Af Amer: >60 mL/min (ref >60)
GFR calc non Af Amer: >60 mL/min (ref >60)
Glucose, Bld: 114 mg/dL — ABNORMAL HIGH (ref 70–99)
Potassium: 4.2 mmol/L (ref 3.5–5.1)
Sodium: 136 mmol/L (ref 135–145)

## 2019-10-23 MED ORDER — LORAZEPAM 1 MG PO TABS: 1.0000 mg | ORAL_TABLET | Freq: Three times a day (TID) | ORAL | 0 refills | Status: DC | PRN

## 2019-10-23 NOTE — ED Notes (Signed)
Pt ambulated in hall with no assistance. O2 sat started at 100%. The lowest it dropped was 96%. Pt has no complaints.

## 2019-10-23 NOTE — ED Provider Notes (Signed)
[facility] EMERGENCY DEPARTMENT Provider Note   CSN: [id number] Arrival date & time: 10/22/19  1447     History   Chief Complaint Chief Complaint  Patient presents with  . Panic Attack  . Numbness    HPI Tracy Graves is a 58 y.o. female.     The history is provided by the patient.  Shortness of Breath Severity:  Moderate Onset quality:  Gradual Timing:  Intermittent Progression:  Worsening Chronicity:  New Context: activity   Relieved by:  Rest Worsened by:  Activity Associated symptoms: cough   Associated symptoms: no abdominal pain, no chest pain, no fever, no hemoptysis, no sore throat and no syncope   Patient with history of obesity, hypertension, diabetes presents with shortness of breath.  She reports she has had mild shortness of breath for quite some time, likely due to her smoking.  She reports over the past several days she has had increasing dyspnea on exertion.  She denies any chest pain, no fever.  She does report dry cough She reports now with any activity she feels short of breath.  She reports when she gets short of breath she then feels very anxious and earlier in the day she had numbness to her left face and numbness in her left fourth and fifth finger.  No focal weakness.  Her symptoms are now improving.  She reports she has had very little contact with the outside world since the Covid pandemic began, she has had low likelihood of contracting Covid  Past Medical History:  Diagnosis Date  . Abscess    chest wall, right breast  . Cellulitis   . History of kidney stones   . Hyperglycemia due to type 2 diabetes mellitus (HCC) 06/05/2015  . Hypertension   . Hypothyroid    2016  . Intertrigo   . Morbid obesity due to excess calories (HCC) 06/05/2015  . Tobacco use     Patient Active Problem List   Diagnosis Date Noted  . Renal stone 01/28/2017  . Hydronephrosis 12/23/2016  . UTI (urinary tract infection) 12/23/2016  . AKI  (acute kidney injury) (HCC) 12/23/2016  . Essential hypertension 12/23/2016  . Hyponatremia 12/23/2016  . Hypothyroidism, acquired 06/07/2015  . Abscess of chest wall   . Morbid obesity due to excess calories (HCC) 06/05/2015  . Tobacco abuse 06/05/2015  . Type 2 diabetes mellitus without complication, without long-term current use of insulin (HCC) 06/05/2015    Past Surgical History:  Procedure Laterality Date  . IR GENERIC HISTORICAL  12/23/2016   IR NEPHROSTOMY PLACEMENT LEFT 12/23/2016 Berdine Dance, MD [facility]  . NEPHROLITHOTOMY Left 01/28/2017   Procedure: LEFT NEPHROLITHOTOMY PERCUTANEOUS;  Surgeon: Crist Fat, MD;  Location: [facility];  Service: Urology;  Laterality: Left;  . NEPHROSTOMY TUBE PLACEMENT ([facility] HX)  12/23/2016     OB History   No obstetric history on file.      Home Medications    Prior to Admission medications   Medication Sig Start Date End Date Taking? Authorizing Provider  levothyroxine (SYNTHROID, LEVOTHROID) 112 MCG tablet Take 112 mcg by mouth daily before breakfast.    [provider]  lisinopril (PRINIVIL,ZESTRIL) 10 MG tablet Take 10 mg by mouth every morning.    [provider]  metFORMIN (GLUCOPHAGE) 500 MG tablet Take 500 mg by mouth 2 (two) times daily with a meal.    [provider]  nicotine (NICODERM CQ - DOSED IN MG/24 HOURS) 21 mg/24hr patch  Place 1 patch (21 mg total) onto the skin daily. Patient not taking: Reported on 12/21/2016 06/08/15   Radene Gunning, NP  traMADol (ULTRAM) 50 MG tablet Take 1 tablet (50 mg total) by mouth every 6 (six) hours as needed. 01/28/17   Ardis Hughs, MD    Family History Family History  Problem Relation Age of Onset  . Breast cancer Mother   . Leukemia Maternal Grandfather   . Ovarian cancer Maternal Aunt     Social History Social History   Tobacco Use  . Smoking status: Current Every Day Smoker    Packs/day: 0.50    Types: Cigarettes  . Smokeless  tobacco: Never Used  Substance Use Topics  . Alcohol use: No  . Drug use: No     Allergies   Other   Review of Systems Review of Systems  Constitutional: Negative for fever.  HENT: Negative for sore throat and trouble swallowing.   Respiratory: Positive for cough and shortness of breath. Negative for hemoptysis.   Cardiovascular: Negative for chest pain and syncope.  Gastrointestinal: Negative for abdominal pain and blood in stool.  Neurological: Positive for numbness. Negative for syncope.  Psychiatric/Behavioral: The patient is nervous/anxious.   All other systems reviewed and are negative.    Physical Exam Updated Vital Signs BP 112/74   Pulse (!) 102   Temp 98.6 F (37 C) (Oral)   Resp 17   Wt 97.1 kg   SpO2 96%   BMI 37.92 kg/m   Physical Exam CONSTITUTIONAL: Well developed/well nourished, anxious HEAD: Normocephalic/atraumatic EYES: EOMI/PERRL ENMT: Mucous membranes moist, no trismus, no oral swelling. NECK: supple no meningeal signs, prominent soft tissue of the neck but no firmness or abscess. SPINE/BACK:entire spine nontender CV: S1/S2 noted, no murmurs/rubs/gallops noted LUNGS: Lungs are clear to auscultation bilaterally, no apparent distress ABDOMEN: soft, nontender NEURO: Pt is awake/alert/appropriate, moves all extremitiesx4.  No facial droop.  No arm or leg drift.  No focal weakness noted EXTREMITIES: pulses normal/equal, full ROM, no lower extremity edema or tenderness SKIN: warm, color normal PSYCH: Anxious  ED Treatments / Results  Labs (all labs ordered are listed, but only abnormal results are displayed) Labs Reviewed  BASIC METABOLIC PANEL - Abnormal; Notable for the following components:      Result Value   Glucose, Bld 114 (*)    Creatinine, Ser 1.02 (*)    Calcium 10.6 (*)    All other components within normal limits  CBC WITH DIFFERENTIAL/PLATELET - Abnormal; Notable for the following components:   WBC 13.0 (*)    Hemoglobin 16.2  (*)    HCT 48.8 (*)    Platelets 443 (*)    Neutro Abs 9.4 (*)    Monocytes Absolute 1.1 (*)    Abs Immature Granulocytes 0.11 (*)    All other components within normal limits  D-DIMER, QUANTITATIVE (NOT AT [facility])    EKG EKG Interpretation  Date/Time:  Thursday October 22 2019 15:36:24 EDT Ventricular Rate:  112 PR Interval:  140 QRS Duration: 76 QT Interval:  324 QTC Calculation: 442 R Axis:   47 Text Interpretation:  Sinus tachycardia Otherwise normal ECG Confirmed by Ripley Fraise [phone]) on 10/23/2019 12:41:07 AM   Radiology Dg Chest 2 View  Result Date: 10/22/2019 CLINICAL DATA:  Pt to ED for sudden onset SOB and panic attacks. Pt states when she feels these panic attacks coming on she experiences tingling in the L side of her face and fingers. Pt states she has  had a dry cough for a couple months now. EXAM: CHEST - 2 VIEW COMPARISON:  06/05/2015 FINDINGS: The heart is enlarged and stable in configuration. Elevated LEFT hemidiaphragm is stable. There are no focal consolidations or pleural effusions. No pulmonary edema. IMPRESSION: Stable cardiomegaly. Electronically Signed   By: Norva PavlovElizabeth  Brown M.D.   On: 10/22/2019 16:52    Procedures Procedures Medications Ordered in ED Medications - No data to display   Initial Impression / Assessment and Plan / ED Course  I have reviewed the triage vital signs and the nursing notes.  Pertinent labs & imaging results that were available during my care of the patient were reviewed by me and considered in my medical decision making (see chart for details).        1:46 AM Patient with increasing shortness of breath over the past several days.  X-ray and EKG are unremarkable. We will check a D-dimer as well as electrolytes and CBC. Low suspicion for ACS at this time.  No obvious signs of CHF on chest x-ray Patient without fever or worsening cough, low suspicion for Covid  4:26 AM Patient improved.  She is resting  comfortably.  She walked without difficulty/no hypoxia D-dimer negative.  No signs of CHF.  Strongly suspicious that this is due to her 30-year habit of smoking.  Encourage smoking cessation.  Will refer to pulmonology.  Suspect she may have some development of COPD. She also was very concerned about her anxiety, and is requesting medications.  Short course of Ativan has been ordered Initial E prescription did not work due to error.  Printed prescription was given  Discussed smoking cessation with patient and was they were offerred resources to help stop.  Total time was 5 min CPT code [id number].  Final Clinical Impressions(s) / ED Diagnoses   Final diagnoses:  Anxiety  Dyspnea on exertion    ED Discharge Orders         Ordered    LORazepam (ATIVAN) 1 MG tablet  Every 8 hours PRN,   Status:  Discontinued     10/23/19 0424    LORazepam (ATIVAN) 1 MG tablet  Every 8 hours PRN     10/23/19 [id number]           Arneisha Kincannon, MD 10/23/19 [phone]

## 2019-10-23 NOTE — ED Notes (Signed)
Family at bedside. 

## 2019-10-27 ENCOUNTER — Other Ambulatory Visit: Payer: Self-pay

## 2019-10-27 ENCOUNTER — Telehealth: Payer: Self-pay | Admitting: Internal Medicine

## 2019-10-27 ENCOUNTER — Encounter: Payer: Self-pay | Admitting: Internal Medicine

## 2019-10-27 ENCOUNTER — Ambulatory Visit (INDEPENDENT_AMBULATORY_CARE_PROVIDER_SITE_OTHER): Payer: Medicaid Other | Admitting: Internal Medicine

## 2019-10-27 VITALS — BP 126/76 | HR 104 | Temp 97.2°F | Ht 63.0 in | Wt 268.0 lb

## 2019-10-27 DIAGNOSIS — R0600 Dyspnea, unspecified: Secondary | ICD-10-CM | POA: Diagnosis not present

## 2019-10-27 DIAGNOSIS — R0609 Other forms of dyspnea: Secondary | ICD-10-CM

## 2019-10-27 DIAGNOSIS — Z23 Encounter for immunization: Secondary | ICD-10-CM | POA: Diagnosis not present

## 2019-10-27 DIAGNOSIS — R053 Chronic cough: Secondary | ICD-10-CM

## 2019-10-27 DIAGNOSIS — J986 Disorders of diaphragm: Secondary | ICD-10-CM | POA: Diagnosis not present

## 2019-10-27 DIAGNOSIS — R05 Cough: Secondary | ICD-10-CM

## 2019-10-27 NOTE — Patient Instructions (Addendum)
ICD-10-CM   1. Dyspnea on exertion  R06.00 CT Chest High Resolution    Pulse oximetry, overnight    ECHOCARDIOGRAM COMPLETE    DG Sniff Test  2. Morbid obesity (HCC)  E66.01 Pulse oximetry, overnight  3. Elevated diaphragm  J98.6 DG Sniff Test  4. Chronic cough  R05    Do HRCT wo contrast Do ONO test on room air Do Echo  Do sniff test Send message to [place] and get your lisinopril changed to another class of blood pressure drugs that do not cause cough.  Followup - next few to several weeks with me or nurse practitioner to review results

## 2019-10-27 NOTE — Progress Notes (Signed)
Subjective:    Patient ID: Tracy Graves, female    DOB: 09/20/1961, 58 y.o.   MRN: [id number]    HPI  IOV 10/27/19 D  Chief Complaint  Patient presents with  . Pulmonary Consult    Pt referred by Dr. Zadie Rhine, [facility] ED. Pt c/o DOE, intermittent dry cough x 4 months. Pt denies CP/tightness and f/c/s.     Tracy Graves is a 58 year old morbidly obese female who has been referred by the ED for shortness of breath with exertion relieved by rest.  Insidious onset 4 months ago.  Progressive since then particularly in the last week or so.  Class III activities make her dyspneic.  It is relieved by rest.  No associated chest pain.  She has a background history of mild cough since 2016 associated with when she started her lisinopril.  There is no wheezing no chest pain.  She tells me that in 2015 she gained 150 pounds of weight in 1 year and early 2016 was diagnosed with diabetes with a blood sugar of 800.  Subsequently lost 140 pounds.  And then she was doing stable but in the last 1 year particularly in 2020 she has gained another 40 pounds.  She feels dyspnea is associated with this weight gain and is related to weight.  She ended up in the ER where her chest x-ray showed elevated left hemidiaphragm with protrusion of the stomach.  This is confirmed by my personal visualization with also seen in chest x-ray in 2016.  In the interim time she had an abdominal CT scan with the lung bases look clear to me but she does seem to have this hernia in the left base.  The hernia is intrathoracic and seems to be causing some compressive effects in the lung.  She is not known to have any heart failure or sleep apnea.  She denies any snoring.  She not on oxygen at home.  No wheezing.  Results for KAILY, WRAGG (MRN [id number]) as of 10/27/2019 12:18  Ref. Range 10/23/2019 02:25  D-Dimer, Quant Latest Ref Range: 0.00 - 0.50 ug/mL-FEU 0.43   Results for SAHER, DAVEE (MRN [id number]) as of 10/27/2019  12:18  Ref. Range 06/05/2015 19:50  FIO2 Latest Units: % 0.28  pH, Ven Latest Ref Range: 7.250 - 7.300  7.424 (H)  pCO2, Ven Latest Ref Range: 45.0 - 50.0 mmHg 40.5 (L)  pO2, Ven Latest Ref Range: 30.0 - 45.0 mmHg 64.4 (H)    Chest x-ray from October 22, 2020shows elevated left hemidiaphragm.  This seems to be similar to 2016.  Personally visualized the image.   CT abd pelvis personally imaged 2016 -  - > Stomach/Bowel: Large hiatal hernia with inverted intrathoracic stomach.  Walking desaturation test: 85 feet x 3 laps on room air with a forehead probe.  She was only able to walk 1 lap and then she stopped because of dyspnea.  Resting pulse ox was 98% with a heart rate of 104/min final pulse ox was 92% with a heart rate of 119/min.     has a past medical history of Abscess, Cellulitis, History of kidney stones, Hyperglycemia due to type 2 diabetes mellitus (HCC) (06/05/2015), Hypertension, Hypothyroid, Intertrigo, Morbid obesity due to excess calories (HCC) (06/05/2015), and Tobacco use.   reports that she quit smoking 4 days ago. Her smoking use included cigarettes. She has a 17.25 pack-year smoking history. She has never used smokeless tobacco.  Past Surgical History:  Procedure Laterality  Date  . IR GENERIC HISTORICAL  12/23/2016   IR NEPHROSTOMY PLACEMENT LEFT 12/23/2016 Berdine DanceMichael Shick, MD [facility]INTER[facility] RAD  . NEPHROLITHOTOMY Left 01/28/2017   Procedure: LEFT NEPHROLITHOTOMY PERCUTANEOUS;  Surgeon: Crist FatBenjamin W Herrick, MD;  Location: [facility];  Service: Urology;  Laterality: Left;  . NEPHROSTOMY TUBE PLACEMENT ([facility] HX)  12/23/2016    Allergies  Allergen Reactions  . Other Rash    all nuts     There is no immunization history on file for this patient.  Family History  Problem Relation Age of Onset  . Breast cancer Mother   . Leukemia Maternal Grandfather   . Ovarian cancer Maternal Aunt   . Heart attack Father   . Diabetes Mellitus II Father      Current Outpatient Medications:   .  levothyroxine (SYNTHROID, LEVOTHROID) 112 MCG tablet, Take 112 mcg by mouth daily before breakfast., Disp: , Rfl:  .  lisinopril (PRINIVIL,ZESTRIL) 10 MG tablet, Take 10 mg by mouth every morning., Disp: , Rfl:  .  LORazepam (ATIVAN) 1 MG tablet, Take 1 tablet (1 mg total) by mouth every 8 (eight) hours as needed for anxiety., Disp: 10 tablet, Rfl: 0 .  metFORMIN (GLUCOPHAGE) 500 MG tablet, Take 500 mg by mouth daily with breakfast. , Disp: , Rfl:  .  nicotine (NICODERM CQ - DOSED IN MG/24 HOURS) 21 mg/24hr patch, Place 1 patch (21 mg total) onto the skin daily., Disp: 28 patch, Rfl: 0 .  traMADol (ULTRAM) 50 MG tablet, Take 1 tablet (50 mg total) by mouth every 6 (six) hours as needed., Disp: 20 tablet, Rfl: 0   Review of Systems  Constitutional: Negative for fever and unexpected weight change.  HENT: Negative for congestion, dental problem, ear pain, nosebleeds, postnasal drip, rhinorrhea, sinus pressure, sneezing, sore throat and trouble swallowing.   Eyes: Negative for redness and itching.  Respiratory: Positive for shortness of breath. Negative for cough, chest tightness and wheezing.   Cardiovascular: Negative for palpitations and leg swelling.  Gastrointestinal: Negative for nausea and vomiting.  Genitourinary: Negative for dysuria.  Musculoskeletal: Negative for joint swelling.  Skin: Negative for rash.  Neurological: Negative for headaches.  Hematological: Does not bruise/bleed easily.  Psychiatric/Behavioral: Negative for dysphoric mood. The patient is not nervous/anxious.        Objective:   Physical Exam  Vitals:   10/27/19 1213  BP: 126/76  Pulse: (!) 104  Temp: (!) 97.2 F (36.2 C)  TempSrc: Skin  SpO2: 98%  Weight: 268 lb (121.6 kg)  Height: 5\' 3"  (1.6 m)    Estimated body mass index is 47.47 kg/m as calculated from the following:   Height as of this encounter: 5\' 3"  (1.6 m).   Weight as of this encounter: 268 lb (121.6 kg).   General Appearance:     Alert, cooperative, no distress, appears stated age - yes , sitting on - chair. Obese +  Head:    Normocephalic, without obvious abnormality, atraumatic  Eyes:    PERRL, conjunctiva/corneas clear,  Ears:    Normal TM's and external ear canals, both ears  Nose:   Nares normal, septum midline, mucosa normal, no drainage    or sinus tenderness. OXYGEN ON no @ ra  Throat:   Lips, mucosa, and tongue normal; teeth and gums normal. Cyanosis on lips - no  Neck:   Supple, symmetrical, trachea midline, no adenopathy;    thyroid:  no enlargement/tenderness/nodules; no carotid   bruit or JVD  Back:  Symmetric, no curvature, ROM normal, no CVA tenderness  Lungs:     Distress - no , Wheeze no, Barrell Chest - no, Purse lip breathing - no, Crackles - no   Chest Wall:    No tenderness or deformity. Scars in chest no   Heart:    Regular rate and rhythm, S1 and S2 normal, no murmur, rub   or gallop  Breast Exam:    NOT DONE  Abdomen:     Soft, non-tender, bowel sounds active all four quadrants,    no masses, no organomegaly  Genitalia:   NOT DONE  Rectal:   NOT DONE  Extremities:   Extremities normal, atraumatic, Clubbing - no, Edema - no  Pulses:   2+ and symmetric all extremities  Skin:   Stigmata of Connective Tissue Disease - no  Lymph nodes:   Cervical, supraclavicular, and axillary nodes normal  Psychiatric:  Neurologic:   Pleasant,   CNII-XII intact, normal strength, sensation  throughout             Assessment & Plan:     ICD-10-CM   1. Dyspnea on exertion  R06.00 CT Chest High Resolution    Pulse oximetry, overnight    ECHOCARDIOGRAM COMPLETE    DG Sniff Test  2. Morbid obesity (HCC)  E66.01 Pulse oximetry, overnight  3. Elevated diaphragm  J98.6 DG Sniff Test  4. Chronic cough  R05   5. Need for immunization against influenza  Z23 Flu Vaccine QUAD 36+ mos IM    Patient Instructions     ICD-10-CM   1. Dyspnea on exertion  R06.00 CT Chest High Resolution    Pulse  oximetry, overnight    ECHOCARDIOGRAM COMPLETE    DG Sniff Test  2. Morbid obesity (HCC)  E66.01 Pulse oximetry, overnight  3. Elevated diaphragm  J98.6 DG Sniff Test  4. Chronic cough  R05    The multifactorial shortness of breath due to obesity, diastolic dysfunction, weight gain, physical deconditioning and elevated hemidiaphragm.  The cough itself could be due to lisinopril.  Difficult for a lot of this as more not at this point.   Do HRCT wo contrast Do ONO test on room air Do Echo  Do sniff test Send message to [place] and get your lisinopril changed to another class of blood pressure drugs that do not cause cough.  Followup - next few to several weeks with me or nurse practitioner to review results -If cough persists despite stopping lisinopril then we can do allergy work-up takes for exam nitric oxide if that is available  Pulmonary function testing this patient will not be helpful.    Marland Kitchen  SIGNATURE    Dr. Brand Males, M.D., F.C.C.P,  Pulmonary and Critical Care Medicine Staff Physician, [place] Director - Interstitial Lung Disease  Program  Pulmonary [place] at [place], [place], [address]  Pager: [phone], If no answer or between  15:00h - 7:00h: call [phone] Telephone: [phone]  12:50 PM 10/27/2019

## 2019-10-27 NOTE — Telephone Encounter (Signed)
Patient Tracy Graves [place] is in lisinopril and has chronic cough.  Please call her primary care physician [facility], [facility] and have him change lisinopril to a calcium channel blocker thank you

## 2019-10-29 ENCOUNTER — Inpatient Hospital Stay: Admission: RE | Admit: 2019-10-29 | Payer: Self-pay | Source: Ambulatory Visit

## 2019-11-03 ENCOUNTER — Ambulatory Visit
Admission: RE | Admit: 2019-11-03 | Discharge: 2019-11-03 | Disposition: A | Discharge status: Home / Self Care | Payer: Self-pay | Source: Ambulatory Visit | Attending: Internal Medicine | Admitting: Internal Medicine

## 2019-11-03 ENCOUNTER — Ambulatory Visit (INDEPENDENT_AMBULATORY_CARE_PROVIDER_SITE_OTHER)
Admission: RE | Admit: 2019-11-03 | Discharge: 2019-11-03 | Disposition: A | Discharge status: Home / Self Care | Payer: Medicaid Other | Source: Ambulatory Visit | Attending: Internal Medicine | Admitting: Internal Medicine

## 2019-11-03 ENCOUNTER — Other Ambulatory Visit: Payer: Self-pay

## 2019-11-03 ENCOUNTER — Ambulatory Visit (HOSPITAL_COMMUNITY): Payer: Medicaid Other | Attending: Internal Medicine

## 2019-11-03 DIAGNOSIS — R0609 Other forms of dyspnea: Secondary | ICD-10-CM

## 2019-11-03 DIAGNOSIS — J986 Disorders of diaphragm: Secondary | ICD-10-CM

## 2019-11-03 DIAGNOSIS — R0600 Dyspnea, unspecified: Secondary | ICD-10-CM | POA: Diagnosis not present

## 2019-11-09 NOTE — Telephone Encounter (Signed)
Called and spoke with pt letting her know that MR wants her BP med to be switched from lisinopril to a different med due to it possibly contributing to her cough. Pt said that her med was changed from lisinopril to metoprolol 25mg  bid.  Stated to pt that I would update her med list and pt verbalized understanding. Nothing further needed.

## 2019-11-10 ENCOUNTER — Telehealth: Payer: Self-pay | Admitting: Internal Medicine

## 2019-11-10 NOTE — Telephone Encounter (Signed)
LMTCB

## 2019-11-11 NOTE — Telephone Encounter (Signed)
LMTCB x2 for [facility].

## 2019-11-12 NOTE — Telephone Encounter (Signed)
Spoke with Ivin Booty at [facility]. She wanted to let us know that the pt's ONO can't be performed until the pt's Medicaid is active. Nothing further was needed.

## 2019-12-01 ENCOUNTER — Telehealth: Payer: Self-pay | Admitting: Internal Medicine

## 2019-12-01 DIAGNOSIS — K449 Diaphragmatic hernia without obstruction or gangrene: Secondary | ICD-10-CM

## 2019-12-01 NOTE — Telephone Encounter (Signed)
Please let Tracy Graves know that I reviewed the test results from early November 2020.  She needs at least a telephone visit to discuss the results.  The main finding is that there is a large hiatal hernia of the entire stomach being pushed into the chest cage.  The diaphragm itself is normal.  The echocardiogram was normal except for some mild heart muscle stiffness.  Plan   -Make a telephone visit 15 minutes to discuss results -Refer to gastroenterology for evaluation of hiatal hernia and from there possibly they will refer her to surgery.

## 2019-12-01 NOTE — Telephone Encounter (Signed)
Called and spoke with pt letting her know the info stated by MR in regards to test results. Stated to pt due to large hiatal hernia seen, we are going to refer her to GI and she verbalized understanding. Also stated to pt that MR wanted to have a televisit scheduled to further go over results and she verbalized understanding. televisit scheduled for pt 12/16 for televisit and order placed for the referral to GI. Nothing further needed.

## 2019-12-16 ENCOUNTER — Ambulatory Visit (INDEPENDENT_AMBULATORY_CARE_PROVIDER_SITE_OTHER): Payer: Self-pay | Admitting: Internal Medicine

## 2019-12-16 ENCOUNTER — Other Ambulatory Visit: Payer: Self-pay

## 2019-12-16 ENCOUNTER — Encounter: Payer: Self-pay | Admitting: Internal Medicine

## 2019-12-16 DIAGNOSIS — K449 Diaphragmatic hernia without obstruction or gangrene: Secondary | ICD-10-CM

## 2019-12-16 DIAGNOSIS — R0600 Dyspnea, unspecified: Secondary | ICD-10-CM

## 2019-12-16 DIAGNOSIS — R0609 Other forms of dyspnea: Secondary | ICD-10-CM

## 2019-12-16 NOTE — Progress Notes (Signed)
HPI  IOV 10/27/19 D  Chief Complaint  Patient presents with  . Pulmonary Consult    Pt referred by Dr. Zadie Rhineonald Wickline, [facility]. Pt c/o DOE, intermittent dry cough x 4 months. Pt denies CP/tightness and f/c/s.     Tracy Graves is a 58 year old morbidly obese female who has been referred by the ED for shortness of breath with exertion relieved by rest.  Insidious onset 4 months ago.  Progressive since then particularly in the last week or so.  Class III activities make her dyspneic.  It is relieved by rest.  No associated chest pain.  She has a background history of mild cough since 2016 associated with when she started her lisinopril.  There is no wheezing no chest pain.  She tells me that in 2015 she gained 150 pounds of weight in 1 year and early 2016 was diagnosed with diabetes with a blood sugar of 800.  Subsequently lost 140 pounds.  And then she was doing stable but in the last 1 year particularly in 2020 she has gained another 40 pounds.  She feels dyspnea is associated with this weight gain and is related to weight.  She ended up in the ER where her chest x-ray showed elevated left hemidiaphragm with protrusion of the stomach.  This is confirmed by my personal visualization with also seen in chest x-ray in 2016.  In the interim time she had an abdominal CT scan with the lung bases look clear to me but she does seem to have this hernia in the left base.  The hernia is intrathoracic and seems to be causing some compressive effects in the lung.  She is not known to have any heart failure or sleep apnea.  She denies any snoring.  She not on oxygen at home.  No wheezing.  Results for Tracy PieriniFRENCH, Haruka F (MRN [id number]) as of 10/27/2019 12:18  Ref. Range 10/23/2019 02:25  D-Dimer, Quant Latest Ref Range: 0.00 - 0.50 ug/mL-FEU 0.43   Results for Tracy PieriniFRENCH, Carolle F (MRN [id number]) as of 10/27/2019 12:18  Ref. Range 06/05/2015 19:50  FIO2 Latest Units: % 0.28  pH, Ven Latest Ref Range: 7.250 -  7.300  7.424 (H)  pCO2, Ven Latest Ref Range: 45.0 - 50.0 mmHg 40.5 (L)  pO2, Ven Latest Ref Range: 30.0 - 45.0 mmHg 64.4 (H)    Chest x-ray from October 22, 2020shows elevated left hemidiaphragm.  This seems to be similar to 2016.  Personally visualized the image.   CT abd pelvis personally imaged 2016 -  - > Stomach/Bowel: Large hiatal hernia with inverted intrathoracic stomach.  Walking desaturation test: 85 feet x 3 laps on room air with a forehead probe.  She was only able to walk 1 lap and then she stopped because of dyspnea.  Resting pulse ox was 98% with a heart rate of 104/min final pulse ox was 92% with a heart rate of 119/min.    OV 12/16/2019  Subjective:  Patient ID: Tracy Graves, female , DOB: 14-Sep-1961 , age 58 y.o. , MRN: [id number] , ADDRESS: [address] [place] [id number]   12/16/2019 -there is a telephone visit.  The risks, benefits and limitations of this telephone visit was explained.  Person identified with 2 person identifier. Chief Complaint  Patient presents with  . televisit    Pt states she has been doing well since last visit. Pt states when she is resting she does not have any problems with her  breathing. Pt said she is now also beginning to be able to do more activities but states still has problems going upstairs.      HPI Tracy Graves 57 y.o. -here to review the results.  She had a sniff test that is essentially normal.  Her CT scan of the chest shows a large hiatal hernia of the entire stomach being inside the chest thoracic cavity.  We made a GI referral but there is nothing seen on the chart.  She says somebody called her and said the appointment is in January 2021 and she has to call back.  She does not know if she has a actual GI appointment or not.  She does not have an appointment with surgery but she is willing to see them.  She is tearful when I mentioned about the hiatal hernia.  She did have an echocardiogram that shows mild diastolic  dysfunction.  She says in the interim her dyspnea is improved this seems to correlate with losing 8 pounds of weight spontaneously.  There are no other issues.    ROS - per HPI     has a past medical history of Abscess, Cellulitis, History of kidney stones, Hyperglycemia due to type 2 diabetes mellitus ([place]) (06/05/2015), Hypertension, Hypothyroid, Intertrigo, Morbid obesity due to excess calories ([place]) (06/05/2015), and Tobacco use.   reports that she quit smoking about 7 weeks ago. Her smoking use included cigarettes. She has a 17.25 pack-year smoking history. She has never used smokeless tobacco.  Past Surgical History:  Procedure Laterality Date  . IR GENERIC HISTORICAL  12/23/2016   IR NEPHROSTOMY PLACEMENT LEFT 12/23/2016 Greggory Keen, MD [facility]-INTER[facility]  . NEPHROLITHOTOMY Left 01/28/2017   Procedure: LEFT NEPHROLITHOTOMY PERCUTANEOUS;  Surgeon: Ardis Hughs, MD;  Location: [facility];  Service: Urology;  Laterality: Left;  . NEPHROSTOMY TUBE PLACEMENT ([place] HX)  12/23/2016    Allergies  Allergen Reactions  . Other Rash    all nuts    Immunization History  Administered Date(s) Administered  . Influenza,inj,Quad PF,6+ Mos 10/27/2019  . Pneumococcal Polysaccharide-23 10/27/2019    Family History  Problem Relation Age of Onset  . Breast cancer Mother   . Leukemia Maternal Grandfather   . Ovarian cancer Maternal Aunt   . Heart attack Father   . Diabetes Mellitus II Father      Current Outpatient Medications:  .  Albuterol Sulfate (PROVENTIL HFA IN), , Disp: , Rfl:  .  levothyroxine (SYNTHROID, LEVOTHROID) 112 MCG tablet, Take 112 mcg by mouth daily before breakfast., Disp: , Rfl:  .  LORazepam (ATIVAN) 1 MG tablet, Take 1 tablet (1 mg total) by mouth every 8 (eight) hours as needed for anxiety., Disp: 10 tablet, Rfl: 0 .  metFORMIN (GLUCOPHAGE) 500 MG tablet, Take 500 mg by mouth daily with breakfast. , Disp: , Rfl:  .  metoprolol tartrate (LOPRESSOR) 25 MG tablet, Take  25 mg by mouth 2 (two) times daily., Disp: , Rfl:  .  nicotine (NICODERM CQ - DOSED IN MG/24 HOURS) 21 mg/24hr patch, Place 1 patch (21 mg total) onto the skin daily., Disp: 28 patch, Rfl: 0 .  traMADol (ULTRAM) 50 MG tablet, Take 1 tablet (50 mg total) by mouth every 6 (six) hours as needed., Disp: 20 tablet, Rfl: 0      Objective:   There were no vitals filed for this visit.  Estimated body mass index is 47.47 kg/m as calculated from the following:   Height as of 10/27/19:  5\' 3"  (1.6 m).   Weight as of 10/27/19: 268 lb (121.6 kg).  @WEIGHTCHANGE @  There were no vitals filed for this visit.   Physical Exam Sounds normal Phone visit Tearful upon hearing about hiatal hernia      Assessment:       ICD-10-CM   1. Dyspnea on exertion  R06.00   2. Large hiatal hernia  K44.9        Plan:     Patient Instructions     ICD-10-CM   1. Dyspnea on exertion  R06.00   2. Large hiatal hernia  K44.9      Glad better subjectively Weight loss helping reduce shortness of breath ECHO - near normal but for very mild heart muscle stiffness CT chest with large hiatal hernia  Plan  - ensure ONO done on room air  - refer [facility] [place] surgery - ensure GI referral is in place - continue weight loss  Followup 6 months or sooner  (> 50% of this 15 min visit spent in  telephone  counseling or/and coordination of care by this undersigned MD - Dr 10/29/19. This includes one or more of the following documented above: discussion of test results, diagnostic or treatment recommendations, prognosis, risks and benefits of management options, instructions, education, compliance or risk-factor reduction)    SIGNATURE    Dr. , M.D., F.C.C.P,  Pulmonary and Critical Care Medicine Staff Physician, [facility] Center Director - Interstitial Lung Disease  Program  Pulmonary Fibrosis [facility] Network at [facility] [place], [facility],  [place]  Pager: [phone], If no answer or between  15:00h - 7:00h: call [phone] Telephone: [phone]  9:39 AM 12/16/2019

## 2019-12-16 NOTE — Patient Instructions (Signed)
ICD-10-CM   1. Dyspnea on exertion  R06.00   2. Large hiatal hernia  K44.9      Glad better subjectively Weight loss helping reduce shortness of breath ECHO - near normal but for very mild heart muscle stiffness CT chest with large hiatal hernia  Plan  - ensure ONO done on room air  - refer [facility] [place] surgery - ensure GI referral is in place - continue weight loss  Followup 6 months or sooner

## 2019-12-16 NOTE — Addendum Note (Signed)
Addended by: Lorretta Harp on: 12/16/2019 09:47 AM   Modules accepted: Orders

## 2020-01-05 ENCOUNTER — Telehealth: Payer: Self-pay

## 2020-01-05 NOTE — Telephone Encounter (Signed)
Telephone from longtime client to inform had qualified for [employer].  Client very appreciative of sevice provided by [facility].  Encouraged to reach out if needs assiatance in future.  Tracy Graves

## 2020-03-24 ENCOUNTER — Ambulatory Visit: Payer: Medicaid Other | Attending: Internal Medicine

## 2020-03-24 DIAGNOSIS — Z23 Encounter for immunization: Secondary | ICD-10-CM

## 2020-03-24 NOTE — Progress Notes (Signed)
   Covid-19 Vaccination Clinic  Name:  Tracy Graves    MRN: [id number] DOB: 04-Jul-1961  03/24/2020  Ms. Tracy Graves was observed post Covid-19 immunization for 15 minutes without incident. She was provided with Vaccine Information Sheet and instruction to access the V-Safe system.   Ms. Tracy Graves was instructed to call 911 with any severe reactions post vaccine: Marland Kitchen Difficulty breathing  . Swelling of face and throat  . A fast heartbeat  . A bad rash all over body  . Dizziness and weakness   Immunizations Administered    Name Date Dose VIS Date Route   Moderna COVID-19 Vaccine 03/24/2020 12:15 PM 0.5 mL 12/01/2019 Intramuscular   Manufacturer: Moderna   Lot: [id number]   NDC: 11021-117-35

## 2020-04-27 ENCOUNTER — Ambulatory Visit: Payer: Medicaid Other | Attending: Internal Medicine

## 2020-04-27 DIAGNOSIS — Z23 Encounter for immunization: Secondary | ICD-10-CM

## 2020-04-27 NOTE — Progress Notes (Signed)
   Covid-19 Vaccination Clinic  Name:  Tracy Graves    MRN: [id number] DOB: Oct 16, 1961  04/27/2020  [name] was observed post Covid-19 immunization for 15 minutes without incident. She was provided with Vaccine Information Sheet and instruction to access the V-Safe system.   [name] was instructed to call 911 with any severe reactions post vaccine: Marland Kitchen Difficulty breathing  . Swelling of face and throat  . A fast heartbeat  . A bad rash all over body  . Dizziness and weakness   Immunizations Administered    Name Date Dose VIS Date Route   Moderna COVID-19 Vaccine 04/27/2020  8:09 AM 0.5 mL 12/2019 Intramuscular   Manufacturer: Moderna   Lot: [id number]   NDC: 75170-017-49

## 2020-06-09 ENCOUNTER — Other Ambulatory Visit: Payer: Self-pay

## 2020-06-09 ENCOUNTER — Encounter: Payer: Self-pay | Admitting: Internal Medicine

## 2020-06-09 ENCOUNTER — Telehealth: Payer: Self-pay | Admitting: Internal Medicine

## 2020-06-09 ENCOUNTER — Ambulatory Visit (INDEPENDENT_AMBULATORY_CARE_PROVIDER_SITE_OTHER): Payer: Medicaid Other | Admitting: Internal Medicine

## 2020-06-09 VITALS — BP 130/78 | HR 101 | Temp 98.0°F | Ht 63.0 in | Wt 256.4 lb

## 2020-06-09 DIAGNOSIS — R0600 Dyspnea, unspecified: Secondary | ICD-10-CM | POA: Diagnosis not present

## 2020-06-09 DIAGNOSIS — R0609 Other forms of dyspnea: Secondary | ICD-10-CM

## 2020-06-09 DIAGNOSIS — K449 Diaphragmatic hernia without obstruction or gangrene: Secondary | ICD-10-CM | POA: Diagnosis not present

## 2020-06-09 NOTE — Patient Instructions (Addendum)
ICD-10-CM   1. Dyspnea on exertion  R06.00   2. Large hiatal hernia  K44.9   3. Morbid obesity (HCC)  E66.01     Dyspnea: Stable  In terms of hiatal hernia: Glad you have been seen by Dr. Phylliss Blakes and surgery and there is a plan to lose weight and operate if that is an emergency or when there is substantial weight loss  Plan  -Keep up efforts to lose weight.  Glad you are seeing a nutritionist. -Refer to pulmonary rehabilitation at [facility] 8-week program -Did you do the overnight oxygen study back in December 2020? -If not we will order 1   Followup 9 months or sooner if needed

## 2020-06-09 NOTE — Progress Notes (Signed)
IOV 10/27/19 D  Chief Complaint  Patient presents with  . Pulmonary Consult    Pt referred by Dr. Ripley Fraise, [facility] ED. Pt c/o DOE, intermittent dry cough x 4 months. Pt denies CP/tightness and f/c/s.     Tracy Graves is a 59 year old morbidly obese female who has been referred by the ED for shortness of breath with exertion relieved by rest.  Insidious onset 4 months ago.  Progressive since then particularly in the last week or so.  Class III activities make her dyspneic.  It is relieved by rest.  No associated chest pain.  She has a background history of mild cough since 2016 associated with when she started her lisinopril.  There is no wheezing no chest pain.  She tells me that in 2015 she gained 150 pounds of weight in 1 year and early 2016 was diagnosed with diabetes with a blood sugar of 800.  Subsequently lost 140 pounds.  And then she was doing stable but in the last 1 year particularly in 2020 she has gained another 40 pounds.  She feels dyspnea is associated with this weight gain and is related to weight.  She ended up in the ER where her chest x-ray showed elevated left hemidiaphragm with protrusion of the stomach.  This is confirmed by my personal visualization with also seen in chest x-ray in 2016.  In the interim time she had an abdominal CT scan with the lung bases look clear to me but she does seem to have this hernia in the left base.  The hernia is intrathoracic and seems to be causing some compressive effects in the lung.  She is not known to have any heart failure or sleep apnea.  She denies any snoring.  She not on oxygen at home.  No wheezing.  Results for SHANEKQUA, SCHAPER (MRN [id number]) as of 10/27/2019 12:18  Ref. Range 10/23/2019 02:25  D-Dimer, Quant Latest Ref Range: 0.00 - 0.50 ug/mL-FEU 0.43   Results for FALEN, LEHRMANN (MRN [id number]) as of 10/27/2019 12:18  Ref. Range 06/05/2015 19:50  FIO2 Latest Units: % 0.28  pH, Ven Latest Ref Range: 7.250 - 7.300   7.424 (H)  pCO2, Ven Latest Ref Range: 45.0 - 50.0 mmHg 40.5 (L)  pO2, Ven Latest Ref Range: 30.0 - 45.0 mmHg 64.4 (H)    Chest x-ray from October 22, 2020shows elevated left hemidiaphragm.  This seems to be similar to 2016.  Personally visualized the image.   CT abd pelvis personally imaged 2016 -  - > Stomach/Bowel: Large hiatal hernia with inverted intrathoracic stomach.  Walking desaturation test: 85 feet x 3 laps on room air with a forehead probe.  She was only able to walk 1 lap and then she stopped because of dyspnea.  Resting pulse ox was 98% with a heart rate of 104/min final pulse ox was 92% with a heart rate of 119/min.    OV 12/16/2019  Subjective:  Patient ID: Tracy Graves, female , DOB: 07/06/1961 , age 32 y.o. , MRN: [id number] , ADDRESS: [place] [address]   12/16/2019 -there is a telephone visit.  The risks, benefits and limitations of this telephone visit was explained.  Person identified with 2 person identifier. Chief Complaint  Patient presents with  . televisit    Pt states she has been doing well since last visit. Pt states when she is resting she does not have any problems with her breathing.  Pt said she is now also beginning to be able to do more activities but states still has problems going upstairs.      HPI Tracy Graves 59 y.o. -here to review the results.  She had a sniff test that is essentially normal.  Her CT scan of the chest shows a large hiatal hernia of the entire stomach being inside the chest thoracic cavity.  We made a GI referral but there is nothing seen on the chart.  She says somebody called her and said the appointment is in January 2021 and she has to call back.  She does not know if she has a actual GI appointment or not.  She does not have an appointment with surgery but she is willing to see them.  She is tearful when I mentioned about the hiatal hernia.  She did have an echocardiogram that shows mild diastolic  dysfunction.  She says in the interim her dyspnea is improved this seems to correlate with losing 8 pounds of weight spontaneously.  There are no other issues.    ROS - per HPI    OV 06/09/2020  Subjective:  Patient ID: Tracy Graves, female , DOB: 20-Dec-1961 , age 34 y.o. , MRN: [id number] , ADDRESS: [address] [place] [address]   06/09/2020 -   Chief Complaint  Patient presents with  . Follow-up    Pt states she has been doing good since last visit and denies any complaints.   Multifactorial dyspnea in the setting of morbid obesity, physical deconditioning and large hiatal hernia  HPI Tracy Graves 58 y.o. -last seen December 2020.  After that we referred her to general surgery.  She saw Dr. Phylliss Blakes.  She has been advised of weight loss first approach before the operating for hiatal hernia.  The other alternative is if she is into an emergency with a hiatal hernia then she would get operated.  Currently she says her weight has plateaued.  She is trying to lose weight.  Referral to a nutritionist has been made.  She exercises at home but is open to the idea of being referred to pulmonary rehabilitation to get some exercise related gains.  Her dyspnea is moderate and stable.  No new issues.     Filed Weights   06/09/20 0942  Weight: 256 lb 6.4 oz (116.3 kg)    ROS - per HPI     has a past medical history of Abscess, Cellulitis, History of kidney stones, Hyperglycemia due to type 2 diabetes mellitus (HCC) (06/05/2015), Hypertension, Hypothyroid, Intertrigo, Morbid obesity due to excess calories (HCC) (06/05/2015), and Tobacco use.   reports that she has been smoking cigarettes. She has a 17.25 pack-year smoking history. She has never used smokeless tobacco.  Past Surgical History:  Procedure Laterality Date  . IR GENERIC HISTORICAL  12/23/2016   IR NEPHROSTOMY PLACEMENT LEFT 12/23/2016 Berdine Dance, MD [facility]INTER[facility] RAD  . NEPHROLITHOTOMY Left 01/28/2017    Procedure: LEFT NEPHROLITHOTOMY PERCUTANEOUS;  Surgeon: Crist Fat, MD;  Location: [facility];  Service: Urology;  Laterality: Left;  . NEPHROSTOMY TUBE PLACEMENT ([facility] HX)  12/23/2016    Allergies  Allergen Reactions  . Other Rash    all nuts    Immunization History  Administered Date(s) Administered  . Influenza,inj,Quad PF,6+ Mos 10/27/2019  . Moderna SARS-COVID-2 Vaccination 03/24/2020, 04/27/2020  . Pneumococcal Polysaccharide-23 10/27/2019    Family History  Problem Relation Age of Onset  . Breast cancer Mother   .  Leukemia Maternal Grandfather   . Ovarian cancer Maternal Aunt   . Heart attack Father   . Diabetes Mellitus II Father      Current Outpatient Medications:  .  Albuterol Sulfate (PROVENTIL HFA IN), , Disp: , Rfl:  .  levothyroxine (SYNTHROID, LEVOTHROID) 112 MCG tablet, Take 112 mcg by mouth daily before breakfast., Disp: , Rfl:  .  LORazepam (ATIVAN) 1 MG tablet, Take 1 tablet (1 mg total) by mouth every 8 (eight) hours as needed for anxiety., Disp: 10 tablet, Rfl: 0 .  metFORMIN (GLUCOPHAGE) 500 MG tablet, Take 500 mg by mouth daily with breakfast. , Disp: , Rfl:  .  metoprolol tartrate (LOPRESSOR) 25 MG tablet, Take 25 mg by mouth 2 (two) times daily., Disp: , Rfl:       Objective:   Vitals:   06/09/20 0942  BP: 130/78  Pulse: (!) 101  Temp: 98 F (36.7 C)  TempSrc: Oral  SpO2: 95%  Weight: 256 lb 6.4 oz (116.3 kg)  Height: 5\' 3"  (1.6 m)    Estimated body mass index is 45.42 kg/m as calculated from the following:   Height as of this encounter: 5\' 3"  (1.6 m).   Weight as of this encounter: 256 lb 6.4 oz (116.3 kg).  @WEIGHTCHANGE @    06/09/20 0942  Weight: 256 lb 6.4 oz (116.3 kg)     Physical Exam Pleasant morbidly obese female clear to auscultation bilaterally normal heart sounds visceral obesity.  No cyanosis no clubbing no edema.     Assessment:       ICD-10-CM   1. Dyspnea on exertion  R06.00   2. Large  hiatal hernia  K44.9   3. Morbid obesity (HCC)  E66.01        Plan:     Patient Instructions     ICD-10-CM   1. Dyspnea on exertion  R06.00   2. Large hiatal hernia  K44.9   3. Morbid obesity (HCC)  E66.01     Dyspnea: Stable  In terms of hiatal hernia: Glad you have been seen by Dr. and surgery and there is a plan to lose weight and operate if that is an emergency or when there is substantial weight loss  Plan  -Keep up efforts to lose weight.  Glad you are seeing a nutritionist. -Refer to pulmonary rehabilitation at [facility] 8-week program -Did you do the overnight oxygen study back in December 2020? -If not we will order 1   Followup 9 months or sooner if needed     SIGNATURE    Dr. Phylliss Blakes, M.D., F.C.C.P,  Pulmonary and Critical Care Medicine Staff Physician, [facility] Center Director - Interstitial Lung Disease  Program  Pulmonary Fibrosis [facility] Network at [facility] [place], [facility], [facility]  Pager: [phone], If no answer or between  15:00h - 7:00h: call [phone] Telephone: [phone]  10:02 AM 06/09/2020

## 2020-06-09 NOTE — Telephone Encounter (Signed)
I discharge her from the clinic I discharge her from the clinic because he was busy with the other patient.  Plan -Please refer her to pulmonary rehabilitation for indication of dyspnea -Please order overnight oxygen study on room air for dyspnea

## 2020-06-10 ENCOUNTER — Telehealth: Payer: Self-pay | Admitting: Internal Medicine

## 2020-06-10 NOTE — Telephone Encounter (Signed)
Spoke with Carlette at Pulmonary Rehab at [facility].  She states that pt has medicaid and this will not cover her rehab, but it she goes to [facility] (lives in [place]) she could pay out of pocket for maintenance rehab.  Want to make sure this would be ok with MR first, bc maintenance would mean less supervision  Will ask MR and then ask the pt if she wants to pay out of pocket for this

## 2020-06-10 NOTE — Telephone Encounter (Signed)
This plan is fine with me

## 2020-06-10 NOTE — Telephone Encounter (Signed)
Called and spoke with pt to make sure she was aware of orders that were needing to take place and she stated that MR discussed it with her. Order placed for pulmonary rehab and order placed for ONO. Nothing further needed.

## 2020-06-13 NOTE — Telephone Encounter (Signed)
Called and spoke with patient. She wants to call [facility] to see how much the OOP pay will be before committing to it. I provided her with the number to [facility]. She verbalized understanding.   Nothing further needed at time of call.

## 2020-08-09 ENCOUNTER — Encounter (INDEPENDENT_AMBULATORY_CARE_PROVIDER_SITE_OTHER): Payer: Self-pay

## 2020-09-12 ENCOUNTER — Encounter (INDEPENDENT_AMBULATORY_CARE_PROVIDER_SITE_OTHER): Payer: Self-pay | Admitting: Family Medicine

## 2020-09-12 ENCOUNTER — Other Ambulatory Visit: Payer: Self-pay

## 2020-09-12 ENCOUNTER — Ambulatory Visit (INDEPENDENT_AMBULATORY_CARE_PROVIDER_SITE_OTHER): Payer: Medicaid Other | Admitting: Family Medicine

## 2020-09-12 VITALS — BP 128/84 | HR 82 | Temp 98.1°F | Ht 63.0 in | Wt 249.0 lb

## 2020-09-12 DIAGNOSIS — I10 Essential (primary) hypertension: Secondary | ICD-10-CM | POA: Diagnosis not present

## 2020-09-12 DIAGNOSIS — R5383 Other fatigue: Secondary | ICD-10-CM

## 2020-09-12 DIAGNOSIS — Z1331 Encounter for screening for depression: Secondary | ICD-10-CM

## 2020-09-12 DIAGNOSIS — Z6841 Body Mass Index (BMI) 40.0 and over, adult: Secondary | ICD-10-CM

## 2020-09-12 DIAGNOSIS — Z0289 Encounter for other administrative examinations: Secondary | ICD-10-CM

## 2020-09-12 DIAGNOSIS — E118 Type 2 diabetes mellitus with unspecified complications: Secondary | ICD-10-CM | POA: Diagnosis not present

## 2020-09-12 DIAGNOSIS — E559 Vitamin D deficiency, unspecified: Secondary | ICD-10-CM

## 2020-09-12 DIAGNOSIS — Z720 Tobacco use: Secondary | ICD-10-CM

## 2020-09-12 DIAGNOSIS — E039 Hypothyroidism, unspecified: Secondary | ICD-10-CM

## 2020-09-12 DIAGNOSIS — R0602 Shortness of breath: Secondary | ICD-10-CM | POA: Diagnosis not present

## 2020-09-12 NOTE — Telephone Encounter (Signed)
Please advise 

## 2020-09-13 LAB — CBC WITH DIFFERENTIAL/PLATELET
Basophils Absolute: 0.1 10*3/uL (ref 0.0–0.2)
Basos: 1 %
EOS (ABSOLUTE): 0.2 10*3/uL (ref 0.0–0.4)
Eos: 1 %
Hematocrit: 50.2 % — ABNORMAL HIGH (ref 34.0–46.6)
Hemoglobin: 17.3 g/dL — ABNORMAL HIGH (ref 11.1–15.9)
Immature Grans (Abs): 0.1 10*3/uL (ref 0.0–0.1)
Immature Granulocytes: 1 %
Lymphocytes Absolute: 2 10*3/uL (ref 0.7–3.1)
Lymphs: 17 %
MCH: 31 pg (ref 26.6–33.0)
MCHC: 34.5 g/dL (ref 31.5–35.7)
MCV: 90 fL (ref 79–97)
Monocytes Absolute: 0.7 10*3/uL (ref 0.1–0.9)
Monocytes: 6 %
Neutrophils Absolute: 8.5 10*3/uL — ABNORMAL HIGH (ref 1.4–7.0)
Neutrophils: 74 %
Platelets: 399 10*3/uL (ref 150–450)
RBC: 5.58 x10E6/uL — ABNORMAL HIGH (ref 3.77–5.28)
RDW: 13.3 % (ref 11.7–15.4)
WBC: 11.6 10*3/uL — ABNORMAL HIGH (ref 3.4–10.8)

## 2020-09-13 LAB — VITAMIN D 25 HYDROXY (VIT D DEFICIENCY, FRACTURES): Vit D, 25-Hydroxy: 10.2 ng/mL — ABNORMAL LOW (ref 30.0–100.0)

## 2020-09-13 LAB — COMPREHENSIVE METABOLIC PANEL
ALT: 16 IU/L (ref 0–32)
AST: 16 IU/L (ref 0–40)
Albumin/Globulin Ratio: 1.4 (ref 1.2–2.2)
Albumin: 4.1 g/dL (ref 3.8–4.9)
Alkaline Phosphatase: 118 IU/L (ref 44–121)
BUN/Creatinine Ratio: 14 (ref 9–23)
BUN: 12 mg/dL (ref 6–24)
Bilirubin Total: 0.3 mg/dL (ref 0.0–1.2)
CO2: 18 mmol/L — ABNORMAL LOW (ref 20–29)
Calcium: 10.7 mg/dL — ABNORMAL HIGH (ref 8.7–10.2)
Chloride: 103 mmol/L (ref 96–106)
Creatinine, Ser: 0.85 mg/dL (ref 0.57–1.00)
GFR calc Af Amer: 87 mL/min/{1.73_m2} (ref >59)
GFR calc non Af Amer: 76 mL/min/{1.73_m2} (ref >59)
Globulin, Total: 3 g/dL (ref 1.5–4.5)
Glucose: 91 mg/dL (ref 65–99)
Potassium: 5 mmol/L (ref 3.5–5.2)
Sodium: 138 mmol/L (ref 134–144)
Total Protein: 7.1 g/dL (ref 6.0–8.5)

## 2020-09-13 LAB — LIPID PANEL WITH LDL/HDL RATIO
Cholesterol, Total: 155 mg/dL (ref 100–199)
HDL: 48 mg/dL (ref >39)
LDL Chol Calc (NIH): 82 mg/dL (ref 0–99)
LDL/HDL Ratio: 1.7 ratio (ref 0.0–3.2)
Triglycerides: 146 mg/dL (ref 0–149)
VLDL Cholesterol Cal: 25 mg/dL (ref 5–40)

## 2020-09-13 LAB — MICROALBUMIN / CREATININE URINE RATIO
Creatinine, Urine: 270 mg/dL
Microalb/Creat Ratio: 13 mg/g creat (ref 0–29)
Microalbumin, Urine: 34.3 ug/mL

## 2020-09-13 LAB — T4: T4, Total: 11.8 ug/dL (ref 4.5–12.0)

## 2020-09-13 LAB — HEMOGLOBIN A1C
Est. average glucose Bld gHb Est-mCnc: 114 mg/dL
Hgb A1c MFr Bld: 5.6 % (ref 4.8–5.6)

## 2020-09-13 LAB — INSULIN, RANDOM: INSULIN: 41.6 u[IU]/mL — ABNORMAL HIGH (ref 2.6–24.9)

## 2020-09-13 LAB — TSH: TSH: 1.16 u[IU]/mL (ref 0.450–4.500)

## 2020-09-13 LAB — T3: T3, Total: 99 ng/dL (ref 71–180)

## 2020-09-19 NOTE — Progress Notes (Signed)
Dear Dr. Twana First,   Thank you for referring Tracy Graves Graves to our clinic. The following note includes my evaluation and treatment recommendations.  Chief Complaint:   OBESITY Tracy Graves Graves (MR# [id number]) is a 59 y.o. female who presents for evaluation and treatment of obesity and related comorbidities. Current BMI is Body mass index is 44.11 kg/m. Tracy Graves Graves has been struggling with her weight for many years and has been unsuccessful in either losing weight, maintaining weight loss, or reaching her healthy weight goal.  Tracy Graves Graves gained weight of 180 lbs to 324 lbs in 1 years. She previously lost 100 lbs then regained 50 lbs. For breakfast she is doing seed bread with sun butter with 1/2 cup of fruit or steel cut toast and fruit (feels full). Lunch is salad with chicken or pork (air fryer). Supper is the same as before except maybe with a little rice. Snack after dinner is popcorn.  Tracy Graves Graves is currently in the action stage of change and ready to dedicate time achieving and maintaining a healthier weight. Tracy Graves Graves is interested in becoming our patient and working on intensive lifestyle modifications including (but not limited to) diet and exercise for weight loss.  Tracy Graves Graves's habits were reviewed today and are as follows: she thinks her family will eat healthier with her, her desired weight loss is 69 lbs, she started gaining weight when she became pregnant and her heaviest weight ever was 324 pounds.  Depression Screen Tracy Graves Graves's Food and Mood (modified PHQ-9) score was 4.  Depression screen PHQ 2/9 09/12/2020  Decreased Interest 1  Down, Depressed, Hopeless 1  PHQ - 2 Score 2  Altered sleeping 1  Tired, decreased energy 1  Change in appetite 0  Feeling bad or failure about yourself  0  Trouble concentrating 0  Moving slowly or fidgety/restless 0  Suicidal thoughts 0  PHQ-9 Score 4  Difficult doing work/chores Somewhat difficult   Subjective:   1. Other fatigue Tracy Graves Graves admits to daytime  somnolence and admits to waking up still tired. Patent has a history of symptoms of daytime fatigue. Tracy Graves Graves generally gets 6 or 7 hours of sleep per night, and states that she has generally restful sleep. Snoring is present. Apneic episodes are not present. Epworth Sleepiness Score is 2. EKG-T wave inversion in I and II. This is a new onset since last October when EKG showed sinus tachycardia.  2. SOB (shortness of breath) on exertion Tracy Graves notes increasing shortness of breath with exercising and seems to be worsening over time with weight gain. She notes getting out of breath sooner with activity than she used to. This has not gotten worse recently. Tracy Graves Graves denies shortness of breath at rest or orthopnea.  3. Essential hypertension Tracy Graves Graves echo in November 2020, showed RF 60-65%, severe left ventricular hypertrophy. Her blood pressure is well controlled today. She denies chest pain, chest pressure, or headache. She is on metoprolol 50 mg.  4. Controlled type 2 diabetes mellitus with complication, without long-term current use of insulin (HCC) Tracy Graves Graves last Tracy Graves was 5.7. She is on metformin 500 mg daily, and she denies GI side effects. Last eye exam was last year.  5. Hypothyroidism, acquired Tracy Graves Graves was diagnosed 4 years ago. Last TSH was within normal limits per the patient. She is on euthyrox 125 mcg.   6. Tobacco abuse Tracy Graves Graves is smoking about 1/2 pack daily and has smoked for 21 years. She has tried to quit many times.  7. Vitamin D deficiency Tracy Graves Graves notes fatigue.  Her diagnosis is likely given obesity.  Assessment/Plan:   1. Other fatigue Tracy Graves Graves does feel that her weight is causing her energy to be lower than it should be. Fatigue may be related to obesity, depression or many other causes. Labs will be ordered, and in the meanwhile, Tracy Graves Graves will focus on self care including making healthy food choices, increasing physical activity and focusing on stress reduction.  - EKG 12-Lead  2. SOB  (shortness of breath) on exertion Tracy Graves Graves does feel that she gets out of breath more easily that she used to when she exercises. Tracy Graves Graves shortness of breath appears to be obesity related and exercise induced. She has agreed to work on weight loss and gradually increase exercise to treat her exercise induced shortness of breath. Will continue to monitor closely.  - CBC with Differential/Platelet - Ambulatory referral to Cardiology  3. Essential hypertension Tracy Graves Graves is working on healthy weight loss and exercise to improve blood pressure Graves. We will watch for signs of hypotension as she continues her lifestyle modifications. We will check labs today, and we will sent a referral to Cardiology.  - Comprehensive metabolic panel - Lipid Panel With LDL/HDL Ratio - Ambulatory referral to Cardiology  4. Controlled type 2 diabetes mellitus with complication, without long-term current use of insulin (HCC) Tracy Graves Graves is important to decrease the likelihood of diabetic complications such as nephropathy, neuropathy, limb loss, blindness, coronary artery disease, and death. Intensive lifestyle modification including diet, exercise and weight loss are the first line of treatment for diabetes. We will check labs today, and Tracy Graves Graves will follow up with Tracy Graves Graves as directed.  - Hemoglobin Tracy Graves - Insulin, random - Urine Microalbumin w/creat. ratio  5. Hypothyroidism, acquired Patient with long-standing hypothyroidism, on levothyroxine therapy. Tracy Graves Graves appears euthyroid. We will check labs today. Orders and follow up as documented in patient record.  Counseling . Tracy Graves Graves is important for overall health. Supratherapeutic thyroid levels are dangerous and will not improve weight loss results. . The correct way to take levothyroxine is fasting, with water, separated by at least 30 minutes from breakfast, and separated by more than 4 hours from calcium, iron, multivitamins, acid reflux medications  (PPIs).   - T3 - T4 - TSH  6. Tobacco abuse We will follow up at Tracy Graves Graves's next appointment.  7. Vitamin D deficiency Low Vitamin D level contributes to fatigue and are associated with obesity, breast, and colon cancer. We will check labs today. Tracy Graves Graves will follow-up for routine testing of Vitamin D, at least 2-3 times per year to avoid over-replacement.  - VITAMIN D 25 Hydroxy (Vit-D Deficiency, Fractures)  8. Depression screening Tracy Graves Graves had a negative depression screening. Depression is commonly associated with obesity and often results in emotional eating behaviors. We will monitor this closely and work on CBT to help improve the non-hunger eating patterns. Referral to Psychology may be required if no improvement is seen as she continues in our clinic.  9. Class 3 severe obesity with serious comorbidity and body mass index (BMI) of 40.0 to 44.9 in adult, unspecified obesity type (HCC) Tracy Graves Graves is currently in the action stage of change and her goal is to continue with weight loss efforts. I recommend Tracy Graves Graves begin the structured treatment plan as follows:  She has agreed to the Category 2 Plan.  Exercise goals: No exercise has been prescribed at this time.   Behavioral modification strategies: increasing lean protein intake, meal planning and cooking strategies, keeping healthy foods in the home and planning  for success.  She was informed of the importance of frequent follow-up visits to maximize her success with intensive lifestyle modifications for her multiple health conditions. She was informed we would discuss her lab results at her next visit unless there is a critical issue that needs to be addressed sooner. Tracy Graves Graves agreed to keep her next visit at the agreed upon time to discuss these results.  Objective:   Blood pressure 128/84, pulse 82, temperature 98.1 F (36.7 C), temperature source Oral, height 5\' 3"  (1.6 m), weight 249 lb (112.9 kg), SpO2 95 %. Body mass index is 44.11  kg/m.  EKG: Normal sinus rhythm, rate 86 BPM.  Indirect Calorimeter completed today shows a VO2 of 201 and a REE of 1401.  Her calculated basal metabolic rate is thus her basal metabolic rate is worse than expected.  General: Cooperative, alert, well developed, in no acute distress. HEENT: Conjunctivae and lids unremarkable. Cardiovascular: Regular rhythm.  Lungs: Normal work of breathing. Neurologic: No focal deficits.   Lab Results  Component Value Date   CREATININE 0.85 09/12/2020   BUN 12 09/12/2020   NA 138 09/12/2020   K 5.0 09/12/2020   CL 103 09/12/2020   CO2 18 (L) 09/12/2020   Lab Results  Component Value Date   ALT 16 09/12/2020   AST 16 09/12/2020   ALKPHOS 118 09/12/2020   BILITOT 0.3 09/12/2020   Lab Results  Component Value Date   HGBA1C 5.6 09/12/2020   HGBA1C 5.3 12/24/2016   HGBA1C 11.2 (H) 06/05/2015   Lab Results  Component Value Date   INSULIN 41.6 (H) 09/12/2020   Lab Results  Component Value Date   TSH 1.160 09/12/2020   Lab Results  Component Value Date   CHOL 155 09/12/2020   HDL 48 09/12/2020   LDLCALC 82 09/12/2020   TRIG 146 09/12/2020   Lab Results  Component Value Date   WBC 11.6 (H) 09/12/2020   HGB 17.3 (H) 09/12/2020   HCT 50.2 (H) 09/12/2020   MCV 90 09/12/2020   PLT 399 09/12/2020   No results found for: IRON, TIBC, FERRITIN  Attestation Statements:   Reviewed by clinician on day of visit: allergies, medications, problem list, medical history, surgical history, family history, social history, and previous encounter notes.  Time spent on visit including pre-visit chart review and post-visit charting and care was 65 minutes.    I, 09/14/2020, am acting as transcriptionist for Burt Knack, MD. This is the patient's first visit at [facility]. The patient's NEW PATIENT PACKET was reviewed at length. Included in the packet: current and past health history, medications, allergies, ROS,  gynecologic history (women only), surgical history, family history, social history, weight history, weight loss surgery history (for those that have had weight loss surgery), nutritional evaluation, mood and food questionnaire, PHQ9, Epworth questionnaire, sleep habits questionnaire, patient life and health improvement goals questionnaire. These will all be scanned into the patient's chart under media.   During the visit, I independently reviewed the patient's EKG, bioimpedance scale results, and indirect calorimeter results. I used this information to tailor a meal plan for the patient that will help her to lose weight and will improve her obesity-related conditions going forward. I performed a medically necessary appropriate examination and/or evaluation. I discussed the assessment and treatment plan with the patient. The patient was provided an opportunity to ask questions and all were answered. The patient agreed with the plan and demonstrated an understanding of the instructions. Labs  were ordered at this visit and will be reviewed at the next visit unless more critical results need to be addressed immediately. Clinical information was updated and documented in the EMR.   Time spent on visit including pre-visit chart review and post-visit care was 65 minutes.   A separate 15 minutes was spent on risk counseling (see above).

## 2020-09-21 ENCOUNTER — Ambulatory Visit: Payer: Medicaid Other | Admitting: Internal Medicine

## 2020-09-21 ENCOUNTER — Encounter: Payer: Self-pay | Admitting: Internal Medicine

## 2020-09-21 ENCOUNTER — Encounter: Payer: Self-pay | Admitting: *Deleted

## 2020-09-21 ENCOUNTER — Other Ambulatory Visit: Payer: Self-pay

## 2020-09-21 VITALS — BP 118/70 | HR 102 | Ht 63.0 in | Wt 250.0 lb

## 2020-09-21 DIAGNOSIS — E669 Obesity, unspecified: Secondary | ICD-10-CM | POA: Insufficient documentation

## 2020-09-21 DIAGNOSIS — E119 Type 2 diabetes mellitus without complications: Secondary | ICD-10-CM | POA: Insufficient documentation

## 2020-09-21 DIAGNOSIS — E1159 Type 2 diabetes mellitus with other circulatory complications: Secondary | ICD-10-CM

## 2020-09-21 DIAGNOSIS — E1169 Type 2 diabetes mellitus with other specified complication: Secondary | ICD-10-CM

## 2020-09-21 DIAGNOSIS — R079 Chest pain, unspecified: Secondary | ICD-10-CM

## 2020-09-21 DIAGNOSIS — I700 Atherosclerosis of aorta: Secondary | ICD-10-CM | POA: Diagnosis not present

## 2020-09-21 DIAGNOSIS — I152 Hypertension secondary to endocrine disorders: Secondary | ICD-10-CM | POA: Insufficient documentation

## 2020-09-21 DIAGNOSIS — I421 Obstructive hypertrophic cardiomyopathy: Secondary | ICD-10-CM

## 2020-09-21 DIAGNOSIS — I10 Essential (primary) hypertension: Secondary | ICD-10-CM

## 2020-09-21 NOTE — Patient Instructions (Signed)
Medication Instructions:  NO CHANGES *If you need a refill on your cardiac medications before your next appointment, please call your pharmacy*   Lab Work: NONE If you have labs (blood work) drawn today and your tests are completely normal, you will receive your results only by: Marland Kitchen MyChart Message (if you have MyChart) OR . A paper copy in the mail If you have any lab test that is abnormal or we need to change your treatment, we will call you to review the results.   Testing/Procedures: Your physician has requested that you have a lexiscan myoview. For further information please visit [email or web address] Please follow instruction sheet, as given.   Your physician has requested that you have an echocardiogram. Echocardiography is a painless test that uses sound waves to create images of your heart. It provides your doctor with information about the size and shape of your heart and how well your heart's chambers and valves are working. This procedure takes approximately one hour. There are no restrictions for this procedure.    Follow-Up: At [facility], you and your health needs are our priority.  As part of our continuing mission to provide you with exceptional heart care, we have created designated Provider Care Teams.  These Care Teams include your primary Cardiologist (physician) and Advanced Practice Providers (APPs -  Physician Assistants and Nurse Practitioners) who all work together to provide you with the care you need, when you need it.  We recommend signing up for the patient portal called "MyChart".  Sign up information is provided on this After Visit Summary.  MyChart is used to connect with patients for Virtual Visits (Telemedicine).  Patients are able to view lab/test results, encounter notes, upcoming appointments, etc.  Non-urgent messages can be sent to your provider as well.   To learn more about what you can do with MyChart, go to [email or web address].    Your next  appointment:   3 month(s)  The format for your next appointment:   In Person  Provider:   Riley Lam, MD   Other Instructions

## 2020-09-21 NOTE — Progress Notes (Signed)
Cardiology Office Note:    Date:  09/21/2020   ID:  Tracy Graves, DOB 06/25/1961, MRN [id number]  PCP:  Patient, No Pcp Per  [facility] Cardiologist:  No primary care provider on file.  [facility] Electrophysiologist:  None   Referring MD: Filbert Schilder, *   CC:  SOB Consulted for the evaluation of SOB at the behest of Patient, No Pcp Per   History of Present Illness:    Tracy Graves is a 59 y.o. female with a hx of (Morbid) Obesity, Diabetes (5.6%), and HTN; Tobacco Abuse (several attempts) who presents with shortness of breath.    Patient notes that she has had shortness of breath started since 10/20.  Had a diagnosis of hiatal hernia and her pulmonologist who said the hiatal hernia. To get worked up for weight management, TWI seen on ECG.  Patient notes that she feels well 90 percent of the time.  Patient has issues catching her breath with activity.  Happens more frequently with the heat. No change with eating FH notable for SCD in the father (age 49) in the setting of significant tobacco use; Uncle on fathers side had HF, died at 26).  No hx of HCM.  Notes severe claustrophobia from when sister would shut her into things.  Past Medical History:  Diagnosis Date  . Abdominal hernia   . Abscess    chest wall, right breast  . Cellulitis   . Diabetes (HCC)   . History of kidney stones   . Hyperglycemia due to type 2 diabetes mellitus (HCC) 06/05/2015  . Hypertension   . Hypothyroid    2016  . Intertrigo   . Morbid obesity due to excess calories (HCC) 06/05/2015  . Multiple food allergies   . Shortness of breath   . Tobacco use     Past Surgical History:  Procedure Laterality Date  . IR GENERIC HISTORICAL  12/23/2016   IR NEPHROSTOMY PLACEMENT LEFT 12/23/2016 Berdine Dance, MD [facility]  . NEPHROLITHOTOMY Left 01/28/2017   Procedure: LEFT NEPHROLITHOTOMY PERCUTANEOUS;  Surgeon: Crist Fat, MD;  Location: [facility];  Service: Urology;  Laterality:  Left;  . NEPHROSTOMY TUBE PLACEMENT ([facility] HX)  12/23/2016   Current Medications: Current Meds  Medication Sig  . Albuterol Sulfate (PROVENTIL HFA IN) as needed (sob, wheezing, allergies).   Marland Kitchen levothyroxine (SYNTHROID) 125 MCG tablet Take 125 mcg by mouth daily before breakfast.   . metFORMIN (GLUCOPHAGE) 500 MG tablet Take 500 mg by mouth daily with breakfast.   . metoprolol succinate (TOPROL-XL) 50 MG 24 hr tablet Take 50 mg by mouth daily.   Allergies:   Other   Social History   Socioeconomic History  . Marital status: Widowed    Spouse name: Not on file  . Number of children: Not on file  . Years of education: Not on file  . Highest education level: Not on file  Occupational History  . Occupation: Stay at Home  Tobacco Use  . Smoking status: Current Some Day Smoker    Packs/day: 0.75    Years: 23.00    Pack years: 17.25    Types: Cigarettes    Last attempt to quit: 10/23/2019    Years since quitting: 0.9  . Smokeless tobacco: Never Used  . Tobacco comment: pt stated smokes every now and then as of 06/09/20  Vaping Use  . Vaping Use: Never used  Substance and Sexual Activity  . Alcohol use: No  . Drug use:  No  . Sexual activity: Yes    Birth control/protection: None  Other Topics Concern  . Not on file  Social History Narrative  . Not on file   Social Determinants of Health   Financial Resource Strain:   . Difficulty of Paying Living Expenses: Not on file  Food Insecurity:   . Worried About Programme researcher, broadcasting/film/video in the Last Year: Not on file  . Ran Out of Food in the Last Year: Not on file  Transportation Needs:   . Lack of Transportation (Medical): Not on file  . Lack of Transportation (Non-Medical): Not on file  Physical Activity:   . Days of Exercise per Week: Not on file  . Minutes of Exercise per Session: Not on file  Stress:   . Feeling of Stress : Not on file  Social Connections:   . Frequency of Communication with Friends and Family: Not on file  .  Frequency of Social Gatherings with Friends and Family: Not on file  . Attends Religious Services: Not on file  . Active Member of Clubs or Organizations: Not on file  . Attends Banker Meetings: Not on file  . Marital Status: Not on file     Family History: The patient's family history includes Breast cancer in her mother; Cancer in her mother; Depression in her father; Diabetes in her father; Diabetes Mellitus II in her father; Heart attack in her father; Leukemia in her maternal grandfather; Obesity in her father and mother; Ovarian cancer in her maternal aunt; Thyroid disease in her father.  No change with eating FH notable for SCD in the father (age 43) in the setting of significant tobacco use; Uncle on fathers side had HF, died at 48)   ROS:   Please see the history of present illness.    All other systems reviewed and are negative.  EKGs/Labs/Other Studies Reviewed:    The following studies were reviewed today: EKG:   09/12/20 SR, 86, nonspecific TWI.  Recent Labs: 09/12/2020: ALT 16; BUN 12; Creatinine, Ser 0.85; Hemoglobin 17.3; Platelets 399; Potassium 5.0; Sodium 138; TSH 1.160  Recent Lipid Panel    Component Value Date/Time   CHOL 155 09/12/2020 1234   TRIG 146 09/12/2020 1234   HDL 48 09/12/2020 1234   LDLCALC 82 09/12/2020 1234   Creatinine 1.02 TSH 0.89  11/03/19: Notable Septal Hypertrophy septal measurement difficult but approximately 17 mm.  No significant LVOT gradient found.   Left Ventricle: Left ventricular ejection fraction, by visual estimation,  is 60 to 65%. The left ventricle has normal function. There is severely  increased left ventricular hypertrophy. Septal left ventricular  hypertrophy. Left ventricular diastolic  parameters are consistent with Grade I diastolic dysfunction (impaired  relaxation). Normal left atrial pressure.  11/03/19 CT SCAN LAD and LCx Calcium;no interstital lung disease  Physical Exam:    VS:  BP 118/70    Pulse (!) 102   Ht 5\' 3"  (1.6 m)   Wt 250 lb (113.4 kg)   SpO2 97%   BMI 44.29 kg/m     Wt Readings from Last 3 Encounters:  09/21/20 250 lb (113.4 kg)  09/12/20 249 lb (112.9 kg)  06/09/20 256 lb 6.4 oz (116.3 kg)     GEN: Obese, Well nourished, well developed in no acute distress HEENT: Normal NECK: No JVD; No carotid bruits LYMPHATICS: No lymphadenopathy CARDIAC: RRR, Dynamic systolic murmur that increase with hand grip, no murmurs, rubs, gallops RESPIRATORY:  Clear to auscultation without  rales, wheezing or rhonchi  ABDOMEN: Soft, non-tender, non-distended MUSCULOSKELETAL:  No edema; No deformity  SKIN: Warm and dry NEUROLOGIC:  Alert and oriented x 3 PSYCHIATRIC:  Normal affect   ASSESSMENT:    1. Obesity, diabetes, and hypertension syndrome (HCC)   2. Morbid obesity (HCC)   3. Aortic atherosclerosis (HCC)    PLAN:    In order of problems listed above:  Shortness of breath  1. Chest Pain - The patient presents with typical chest pain. - CVD risk factors include Morbid Obesity, Tobacco Use, Diabetes Mellitus. And coronary artery calcifications -Continue beta blocker - Given prior history of sx and calcifications , would recommend a nuclear medicine stress test (NPO at midnight/hold beta blocker in AM); discussed risks, benefits, and alternatives of the diagnostic procedure including chest pain, arrhythmia, and death (lexiscan) Patient amenable for testing.  LV hypertrophy - no appreciate cause for hypertrophy from secondary medical problems - Repeat Echocardiogram given suboptimal images; with protocol for HOCM - if evidence of HCM; will do Event monitor, Stress Echo, and consider CMR (size for bore is 51 cm; MRI bore size < 70 cm.  Tobacco Abuse. 1. The patient was counseled on the dangers of tobacco use, both inhaled and oral, which include, but are not limited to cardiovascular disease, increased cancer risk of multiple types of cancer, COPD, peripheral  vascular disease, strokes. 2. She was also counseled on the benefits of smoking cessation. 3. The patient was firmly advised to quit.    4. We also reviewed strategies to maximize success, including:  Removing cigarettes and smoking materials from environment  Stress management  Substitution of other forms of reinforcement Support of family/friends.  Selecting a quit date.  Patient provided contact information for 1-800-QUIT-NOW   3-4 months follow up unless new symptoms or abnormal test results warranting change in plan   Medication Adjustments/Labs and Tests Ordered: Current medicines are reviewed at length with the patient today.  Concerns regarding medicines are outlined above.  No orders of the defined types were placed in this encounter.  No orders of the defined types were placed in this encounter.   There are no Patient Instructions on file for this visit.   Signed, Christell Constant, MD  09/21/2020 9:38 AM    [facility]

## 2020-09-22 ENCOUNTER — Telehealth (HOSPITAL_COMMUNITY): Payer: Self-pay

## 2020-09-22 NOTE — Telephone Encounter (Signed)
Detailed instructions left on the patient's answering machine. Asked to call back with any questions. S.Jersey Espinoza EMTP 

## 2020-09-26 ENCOUNTER — Ambulatory Visit (INDEPENDENT_AMBULATORY_CARE_PROVIDER_SITE_OTHER): Payer: Medicaid Other | Admitting: Family Medicine

## 2020-09-26 ENCOUNTER — Encounter (INDEPENDENT_AMBULATORY_CARE_PROVIDER_SITE_OTHER): Payer: Self-pay | Admitting: Family Medicine

## 2020-09-26 ENCOUNTER — Other Ambulatory Visit: Payer: Self-pay

## 2020-09-26 VITALS — BP 133/85 | HR 78 | Temp 97.6°F | Ht 63.0 in | Wt 244.0 lb

## 2020-09-26 DIAGNOSIS — E1169 Type 2 diabetes mellitus with other specified complication: Secondary | ICD-10-CM | POA: Diagnosis not present

## 2020-09-26 DIAGNOSIS — E1165 Type 2 diabetes mellitus with hyperglycemia: Secondary | ICD-10-CM

## 2020-09-26 DIAGNOSIS — E559 Vitamin D deficiency, unspecified: Secondary | ICD-10-CM

## 2020-09-26 DIAGNOSIS — Z720 Tobacco use: Secondary | ICD-10-CM | POA: Diagnosis not present

## 2020-09-26 DIAGNOSIS — E785 Hyperlipidemia, unspecified: Secondary | ICD-10-CM

## 2020-09-26 DIAGNOSIS — Z6841 Body Mass Index (BMI) 40.0 and over, adult: Secondary | ICD-10-CM

## 2020-09-26 MED ORDER — VITAMIN D (ERGOCALCIFEROL) 1.25 MG (50000 UNIT) PO CAPS: 50000.0000 [IU] | ORAL_CAPSULE | ORAL | 0 refills | Status: DC

## 2020-09-26 MED ORDER — ATORVASTATIN CALCIUM 10 MG PO TABS: 10.0000 mg | ORAL_TABLET | Freq: Every day | ORAL | 3 refills | Status: DC

## 2020-09-27 ENCOUNTER — Ambulatory Visit (HOSPITAL_COMMUNITY): Payer: Medicaid Other | Attending: Cardiovascular Disease

## 2020-09-27 DIAGNOSIS — I700 Atherosclerosis of aorta: Secondary | ICD-10-CM | POA: Diagnosis present

## 2020-09-27 MED ORDER — REGADENOSON 0.4 MG/5ML IV SOLN
0.4000 mg | Freq: Once | INTRAVENOUS | Status: AC
  Administered 2020-09-27: 0.4 mg via INTRAVENOUS

## 2020-09-27 MED ORDER — TECHNETIUM TC 99M TETROFOSMIN IV KIT
32.6000 | PACK | Freq: Once | INTRAVENOUS | Status: AC | PRN
  Administered 2020-09-27: 32.6 via INTRAVENOUS
  Filled 2020-09-27: qty 33

## 2020-09-27 NOTE — Progress Notes (Signed)
Chief Complaint:   OBESITY Tracy Graves is here to discuss her progress with her obesity treatment plan along with follow-up of her obesity related diagnoses. Tracy Graves is on the Category 2 Plan and states she is following her eating plan approximately 100% of the time. Tracy Graves states she is walking for 20 minutes 3 times per week.  Today's visit was #: 2 Starting weight: 249 lbs Starting date: 09/12/2020 Today's weight: 244 lbs Today's date: 09/26/2020 Total lbs lost to date: 5 Total lbs lost since last in-office visit: 5  Interim History: Tracy Graves felt it was a significant amount of meat and is somewhat over eggs, otherwise she felt the plan was easy. Breakfast she did mostly egg option and occasionally substituted meat for eggs. Lunch she switched between the two. Dinner was a mixture of different foods and veggies. Snacks she did yogurt, rice crackers and hummus. She denies cravings. She has a stress test tomorrow.  Subjective:   1. Type 2 diabetes mellitus with hyperglycemia, without long-term current use of insulin (HCC) Tracy Graves is on metformin 500 mg daily. Last A1c was 5.6 and insulin 41.6. She denies significant sugar or carbohydrate cravings. I dicussed labs with the patient today.  2. Hyperlipidemia associated with type 2 diabetes mellitus (HCC) Tracy Graves's last LDL was 82, triglycerides 825, HDL 48, and total cholesterol 155. She is not on statin, and her 10 year ASCVD risk score is 14.3%, mod. Intensity statin recommended. I discussed labs with the patient today.  3. Vitamin D deficiency Tracy Graves's last Vit D level was 10.2. She is not on supplementation. I discussed labs with the patient today.  4. Tobacco abuse Tracy Graves has cut down to 4-5 cigarettes per day. After seeing the Cardiologist it decreased from a half of pack. She had significant night terrors on Chantix.  Assessment/Plan:   1. Type 2 diabetes mellitus with hyperglycemia, without long-term current use of insulin (HCC) Good blood  sugar control is important to decrease the likelihood of diabetic complications such as nephropathy, neuropathy, limb loss, blindness, coronary artery disease, and death. Intensive lifestyle modification including diet, exercise and weight loss are the first line of treatment for diabetes. Tracy Graves will continue metformin, no refill needed and we will repeat labs in 3 months.  2. Hyperlipidemia associated with type 2 diabetes mellitus (HCC) Cardiovascular risk and specific lipid/LDL goals reviewed. We discussed several lifestyle modifications today and Tracy Graves will continue to work on diet, exercise and weight loss efforts. Tracy Graves agreed to start atorvastatin 10 mg PO daily with no refills. Orders and follow up as documented in patient record.   Counseling Intensive lifestyle modifications are the first line treatment for this issue.  Dietary changes: Increase soluble fiber. Decrease simple carbohydrates.  Exercise changes: Moderate to vigorous-intensity aerobic activity 150 minutes per week if tolerated.  Lipid-lowering medications: see documented in medical record.  - atorvastatin (LIPITOR) 10 MG tablet; Take 1 tablet (10 mg total) by mouth daily.  Dispense: 90 tablet; Refill: 3  3. Vitamin D deficiency Low Vitamin D level contributes to fatigue and are associated with obesity, breast, and colon cancer. Tracy Graves agreed to start prescription Vitamin D 50,000 IU every week with no refills. She will follow-up for routine testing of Vitamin D, at least 2-3 times per year to avoid over-replacement.  - Vitamin D, Ergocalciferol, (DRISDOL) 1.25 MG (50000 UNIT) CAPS capsule; Take 1 capsule (50,000 Units total) by mouth every 7 (seven) days.  Dispense: 4 capsule; Refill: 0  4. Tobacco abuse We discussed  smoking cessation and nicotine replacement products.  5. Class 3 severe obesity with serious comorbidity and body mass index (BMI) of 40.0 to 44.9 in adult, unspecified obesity type [facility]) Tracy Graves is currently  in the action stage of change. As such, her goal is to continue with weight loss efforts. She has agreed to the Category 2 Plan.   Exercise goals: No exercise has been prescribed at this time.  Behavioral modification strategies: increasing lean protein intake, meal planning and cooking strategies, keeping healthy foods in the home and planning for success.  Tracy Graves has agreed to follow-up with our clinic in 2 weeks. She was informed of the importance of frequent follow-up visits to maximize her success with intensive lifestyle modifications for her multiple health conditions.   Objective:   Blood pressure 133/85, pulse 78, temperature 97.6 F (36.4 C), temperature source Oral, height 5\' 3"  (1.6 m), weight 244 lb (110.7 kg), SpO2 95 %. Body mass index is 43.22 kg/m.  General: Cooperative, alert, well developed, in no acute distress. HEENT: Conjunctivae and lids unremarkable. Cardiovascular: Regular rhythm.  Lungs: Normal work of breathing. Neurologic: No focal deficits.   Lab Results  Component Value Date   CREATININE 0.85 09/12/2020   BUN 12 09/12/2020   NA 138 09/12/2020   K 5.0 09/12/2020   CL 103 09/12/2020   CO2 18 (L) 09/12/2020   Lab Results  Component Value Date   ALT 16 09/12/2020   AST 16 09/12/2020   ALKPHOS 118 09/12/2020   BILITOT 0.3 09/12/2020   Lab Results  Component Value Date   HGBA1C 5.6 09/12/2020   HGBA1C 5.3 12/24/2016   HGBA1C 11.2 (H) 06/05/2015   Lab Results  Component Value Date   INSULIN 41.6 (H) 09/12/2020   Lab Results  Component Value Date   TSH 1.160 09/12/2020   Lab Results  Component Value Date   CHOL 155 09/12/2020   HDL 48 09/12/2020   LDLCALC 82 09/12/2020   TRIG 146 09/12/2020   Lab Results  Component Value Date   WBC 11.6 (H) 09/12/2020   HGB 17.3 (H) 09/12/2020   HCT 50.2 (H) 09/12/2020   MCV 90 09/12/2020   PLT 399 09/12/2020   No results found for: IRON, TIBC, FERRITIN  Attestation Statements:   Reviewed by  clinician on day of visit: allergies, medications, problem list, medical history, surgical history, family history, social history, and previous encounter notes.  Time spent on visit including pre-visit chart review and post-visit care and charting was 60 minutes.    I, 09/14/2020, am acting as transcriptionist for Burt Knack, MD. I have reviewed the above documentation for accuracy and completeness, and I agree with the above. - [name], MD

## 2020-09-28 ENCOUNTER — Other Ambulatory Visit: Payer: Self-pay

## 2020-09-28 ENCOUNTER — Ambulatory Visit (HOSPITAL_COMMUNITY): Payer: Medicaid Other | Attending: Internal Medicine

## 2020-09-28 LAB — MYOCARDIAL PERFUSION IMAGING
LV dias vol: 57 mL (ref 46–106)
LV sys vol: 19 mL
Peak HR: 96 {beats}/min
Rest HR: 74 {beats}/min
SDS: 0
SRS: 0
SSS: 0
TID: 0.86

## 2020-09-28 MED ORDER — TECHNETIUM TC 99M TETROFOSMIN IV KIT
32.0000 | PACK | Freq: Once | INTRAVENOUS | Status: AC | PRN
  Administered 2020-09-28: 32 via INTRAVENOUS
  Filled 2020-09-28: qty 32

## 2020-10-04 ENCOUNTER — Ambulatory Visit (HOSPITAL_COMMUNITY): Payer: Medicaid Other | Attending: Cardiology

## 2020-10-04 ENCOUNTER — Other Ambulatory Visit: Payer: Self-pay

## 2020-10-04 DIAGNOSIS — I700 Atherosclerosis of aorta: Secondary | ICD-10-CM | POA: Insufficient documentation

## 2020-10-04 DIAGNOSIS — I421 Obstructive hypertrophic cardiomyopathy: Secondary | ICD-10-CM

## 2020-10-04 LAB — ECHOCARDIOGRAM COMPLETE
Area-P 1/2: 1.98 cm2
S' Lateral: 2.4 cm

## 2020-10-05 DIAGNOSIS — I10 Essential (primary) hypertension: Secondary | ICD-10-CM | POA: Diagnosis not present

## 2020-10-05 DIAGNOSIS — E119 Type 2 diabetes mellitus without complications: Secondary | ICD-10-CM | POA: Diagnosis not present

## 2020-10-05 DIAGNOSIS — E785 Hyperlipidemia, unspecified: Secondary | ICD-10-CM | POA: Diagnosis not present

## 2020-10-05 DIAGNOSIS — E039 Hypothyroidism, unspecified: Secondary | ICD-10-CM | POA: Diagnosis not present

## 2020-10-11 ENCOUNTER — Encounter (INDEPENDENT_AMBULATORY_CARE_PROVIDER_SITE_OTHER): Payer: Self-pay | Admitting: Family Medicine

## 2020-10-11 ENCOUNTER — Other Ambulatory Visit: Payer: Self-pay

## 2020-10-11 ENCOUNTER — Ambulatory Visit (INDEPENDENT_AMBULATORY_CARE_PROVIDER_SITE_OTHER): Payer: Medicaid Other | Admitting: Family Medicine

## 2020-10-11 VITALS — BP 111/77 | HR 85 | Temp 98.1°F | Ht 63.0 in | Wt 243.0 lb

## 2020-10-11 DIAGNOSIS — E559 Vitamin D deficiency, unspecified: Secondary | ICD-10-CM | POA: Diagnosis not present

## 2020-10-11 DIAGNOSIS — Z6841 Body Mass Index (BMI) 40.0 and over, adult: Secondary | ICD-10-CM

## 2020-10-11 DIAGNOSIS — K5909 Other constipation: Secondary | ICD-10-CM

## 2020-10-11 MED ORDER — VITAMIN D (ERGOCALCIFEROL) 1.25 MG (50000 UNIT) PO CAPS: 50000.0000 [IU] | ORAL_CAPSULE | ORAL | 0 refills | Status: DC

## 2020-10-17 NOTE — Progress Notes (Signed)
Chief Complaint:   OBESITY Tracy Graves is here to discuss her progress with her obesity treatment plan along with follow-up of her obesity related diagnoses. Tracy Graves is on the Category 2 Plan and states she is following her eating plan approximately 70% of the time. Tracy Graves states she is exercising for 0 minutes 0 times per week.  Today's visit was #: 3 Starting weight: 249 lbs Starting date: 09/12/2020 Today's weight: 243 lbs Today's date: 10/11/2020 Total lbs lost to date: 6 lbs Total lbs lost since last in-office visit: 1 lb  Interim History: Tracy Graves has been following the plan as strictly as she could be.  She had 3 heart tests and then had to put her dog to sleep, so these things really interfered with her eating schedule.  She was not on plan over the next 2 weeks.  She has food for the plan in her house.  She acknowledges that she skipped quite a few meals over the last few weeks.  Subjective:   1. Vitamin D deficiency Tracy Graves's Vitamin D level was 10.2 on 09/12/2020. She is currently taking prescription vitamin D 50,000 IU each week. She denies nausea, vomiting or muscle weakness.  She endorses fatigue.  2. Other constipation She has noticed a significant decrease in frequency of bowel movements.  Assessment/Plan:   1. Vitamin D deficiency Low Vitamin D level contributes to fatigue and are associated with obesity, breast, and colon cancer. She agrees to continue to take prescription Vitamin D @50 ,000 IU every week and will follow-up for routine testing of Vitamin D, at least 2-3 times per year to avoid over-replacement.  -Refill Vitamin D, Ergocalciferol, (DRISDOL) 1.25 MG (50000 UNIT) CAPS capsule; Take 1 capsule (50,000 Units total) by mouth every 7 (seven) days.  Dispense: 4 capsule; Refill: 0  2. Other constipation Tracy Graves was informed that a decrease in bowel movement frequency is normal while losing weight, but stools should not be hard or painful. Orders and follow up as  documented in patient record.  She will start taking MiraLAX daily.  Counseling Getting to Good Bowel Health: Your goal is to have one soft bowel movement each day. Drink at least 8 glasses of water each day. Eat plenty of fiber (goal is over 25 grams each day). It is best to get most of your fiber from dietary sources which includes leafy green vegetables, fresh fruit, and whole grains. You may need to add fiber with the help of OTC fiber supplements. These include Metamucil, Citrucel, and Flaxseed. If you are still having trouble, try adding Miralax or Magnesium Citrate. If all of these changes do not work, [name].  3. Class 3 severe obesity with serious comorbidity and body mass index (BMI) of 40.0 to 44.9 in adult, unspecified obesity type (HCC)  Tracy Graves is currently in the action stage of change. As such, her goal is to continue with weight loss efforts. She has agreed to the Category 2 Plan.   Exercise goals: All adults should avoid inactivity. Some physical activity is better than none, and adults who participate in any amount of physical activity gain some health benefits.  Behavioral modification strategies: increasing lean protein intake, no skipping meals, meal planning and cooking strategies and planning for success.  Tracy Graves has agreed to follow-up with our clinic in 2-3 weeks. She was informed of the importance of frequent follow-up visits to maximize her success with intensive lifestyle modifications for her multiple health conditions.   Objective:   Blood pressure  111/77, pulse 85, temperature 98.1 F (36.7 C), temperature source Oral, height 5\' 3"  (1.6 m), weight 243 lb (110.2 kg), SpO2 96 %. Body mass index is 43.05 kg/m.  General: Cooperative, alert, well developed, in no acute distress. HEENT: Conjunctivae and lids unremarkable. Cardiovascular: Regular rhythm.  Lungs: Normal work of breathing. Neurologic: No focal deficits.   Lab Results  Component Value  Date   CREATININE 0.85 09/12/2020   BUN 12 09/12/2020   NA 138 09/12/2020   K 5.0 09/12/2020   CL 103 09/12/2020   CO2 18 (L) 09/12/2020   Lab Results  Component Value Date   ALT 16 09/12/2020   AST 16 09/12/2020   ALKPHOS 118 09/12/2020   BILITOT 0.3 09/12/2020   Lab Results  Component Value Date   HGBA1C 5.6 09/12/2020   HGBA1C 5.3 12/24/2016   HGBA1C 11.2 (H) 06/05/2015   Lab Results  Component Value Date   INSULIN 41.6 (H) 09/12/2020   Lab Results  Component Value Date   TSH 1.160 09/12/2020   Lab Results  Component Value Date   CHOL 155 09/12/2020   HDL 48 09/12/2020   LDLCALC 82 09/12/2020   TRIG 146 09/12/2020   Lab Results  Component Value Date   WBC 11.6 (H) 09/12/2020   HGB 17.3 (H) 09/12/2020   HCT 50.2 (H) 09/12/2020   MCV 90 09/12/2020   PLT 399 09/12/2020   Attestation Statements:   Reviewed by clinician on day of visit: allergies, medications, problem list, medical history, surgical history, family history, social history, and previous encounter notes.  Time spent on visit including pre-visit chart review and post-visit care and charting was 18 minutes.   I, 09/14/2020, CMA, am acting as transcriptionist for Insurance claims handler, MD.  I have reviewed the above documentation for accuracy and completeness, and I agree with the above. - [name], MD

## 2020-11-02 ENCOUNTER — Encounter (INDEPENDENT_AMBULATORY_CARE_PROVIDER_SITE_OTHER): Payer: Self-pay | Admitting: Family Medicine

## 2020-11-02 ENCOUNTER — Ambulatory Visit (INDEPENDENT_AMBULATORY_CARE_PROVIDER_SITE_OTHER): Payer: Medicaid Other | Admitting: Family Medicine

## 2020-11-02 ENCOUNTER — Other Ambulatory Visit: Payer: Self-pay

## 2020-11-02 VITALS — BP 115/77 | HR 89 | Temp 98.1°F | Ht 63.0 in | Wt 238.0 lb

## 2020-11-02 DIAGNOSIS — E559 Vitamin D deficiency, unspecified: Secondary | ICD-10-CM | POA: Diagnosis not present

## 2020-11-02 DIAGNOSIS — Z6841 Body Mass Index (BMI) 40.0 and over, adult: Secondary | ICD-10-CM

## 2020-11-02 DIAGNOSIS — E7849 Other hyperlipidemia: Secondary | ICD-10-CM

## 2020-11-02 MED ORDER — VITAMIN D (ERGOCALCIFEROL) 1.25 MG (50000 UNIT) PO CAPS: 50000.0000 [IU] | ORAL_CAPSULE | ORAL | 0 refills | Status: DC

## 2020-11-03 NOTE — Progress Notes (Signed)
Chief Complaint:   OBESITY Tracy Graves is here to discuss her progress with her obesity treatment plan along with follow-up of her obesity related diagnoses. Tracy Graves is on the Category 2 Plan and states she is following her eating plan approximately 98% of the time. Tracy Graves states she is walking for 20-30 minutes 3-5 times per week.  Today's visit was #: 4 Starting weight: 249 lbs Starting date: 09/12/2020 Today's weight: 238 lbs Today's date: 11/02/2020 Total lbs lost to date: 11 Total lbs lost since last in-office visit: 5  Interim History: Tracy Graves has been really committed to the Category 2. She thinks it is relatively easy to follow and minimal if any hunger. She denies cravings. Snack calories are celery and hummus, cheese, yogurt, tzaziki sauce. Getting in 5-7 oz at dinner.  Subjective:   1. Other hyperlipidemia Tracy Graves is on statin, and she denies transaminitis or myalgias. Last LDL was 82, triglycerides 157, and HDL 48.  2. Vitamin D deficiency Tracy Graves denies nausea, vomiting, or muscle weakness, but notes fatigue. She ison prescription Vit D.  Assessment/Plan:   1. Other hyperlipidemia Cardiovascular risk and specific lipid/LDL goals reviewed. We discussed several lifestyle modifications today. [name] will continue to work on diet, exercise and weight loss efforts. We will repeat labs in February 2022. Orders and follow up as documented in patient record.   Counseling Intensive lifestyle modifications are the first line treatment for this issue.  Dietary changes: Increase soluble fiber. Decrease simple carbohydrates.  Exercise changes: Moderate to vigorous-intensity aerobic activity 150 minutes per week if tolerated.  Lipid-lowering medications: see documented in medical record.  2. Vitamin D deficiency Low Vitamin D level contributes to fatigue and are associated with obesity, breast, and colon cancer. We will refill prescription Vitamin D for 1 month. Tracy Graves will follow-up for  routine testing of Vitamin D, at least 2-3 times per year to avoid over-replacement.  - Vitamin D, Ergocalciferol, (DRISDOL) 1.25 MG (50000 UNIT) CAPS capsule; Take 1 capsule (50,000 Units total) by mouth every 7 (seven) days.  Dispense: 4 capsule; Refill: 0  3. Class 3 severe obesity with serious comorbidity and body mass index (BMI) of 40.0 to 44.9 in adult, unspecified obesity type [facility]) Tracy Graves is currently in the action stage of change. As such, her goal is to continue with weight loss efforts. She has agreed to the Category 2 Plan.   Exercise goals: As is.  Behavioral modification strategies: increasing lean protein intake and meal planning and cooking strategies.  Tracy Graves has agreed to follow-up with our clinic in 3 to 4 weeks. She was informed of the importance of frequent follow-up visits to maximize her success with intensive lifestyle modifications for her multiple health conditions.   Objective:   Blood pressure 115/77, pulse 89, temperature 98.1 F (36.7 C), temperature source Oral, height 5\' 3"  (1.6 m), weight 238 lb (108 kg), SpO2 96 %. Body mass index is 42.16 kg/m.  General: Cooperative, alert, well developed, in no acute distress. HEENT: Conjunctivae and lids unremarkable. Cardiovascular: Regular rhythm.  Lungs: Normal work of breathing. Neurologic: No focal deficits.   Lab Results  Component Value Date   CREATININE 0.85 09/12/2020   BUN 12 09/12/2020   NA 138 09/12/2020   K 5.0 09/12/2020   CL 103 09/12/2020   CO2 18 (L) 09/12/2020   Lab Results  Component Value Date   ALT 16 09/12/2020   AST 16 09/12/2020   ALKPHOS 118 09/12/2020   BILITOT 0.3 09/12/2020   Lab  Results  Component Value Date   HGBA1C 5.6 09/12/2020   HGBA1C 5.3 12/24/2016   HGBA1C 11.2 (H) 06/05/2015   Lab Results  Component Value Date   INSULIN 41.6 (H) 09/12/2020   Lab Results  Component Value Date   TSH 1.160 09/12/2020   Lab Results  Component Value Date   CHOL 155  09/12/2020   HDL 48 09/12/2020   LDLCALC 82 09/12/2020   TRIG 146 09/12/2020   Lab Results  Component Value Date   WBC 11.6 (H) 09/12/2020   HGB 17.3 (H) 09/12/2020   HCT 50.2 (H) 09/12/2020   MCV 90 09/12/2020   PLT 399 09/12/2020   No results found for: IRON, TIBC, FERRITIN  Attestation Statements:   Reviewed by clinician on day of visit: allergies, medications, problem list, medical history, surgical history, family history, social history, and previous encounter notes.  Time spent on visit including pre-visit chart review and post-visit care and charting was 25 minutes.    I, Burt Knack, am acting as transcriptionist for Tracy Likes, MD.  I have reviewed the above documentation for accuracy and completeness, and I agree with the above. - Katherina Mires, MD

## 2020-11-04 IMAGING — DX DG CHEST 2V
2 series · 2 of 2 positions shown · non-contrast
Comparison: 06/05/2015

CLINICAL DATA: Pt to ED for sudden onset SOB and panic attacks. Pt
states when she feels these panic attacks coming on she experiences
tingling in the L side of her face and fingers. Pt states she has
had a dry cough for a couple months now.

EXAM:
CHEST - 2 VIEW

[chest pa]
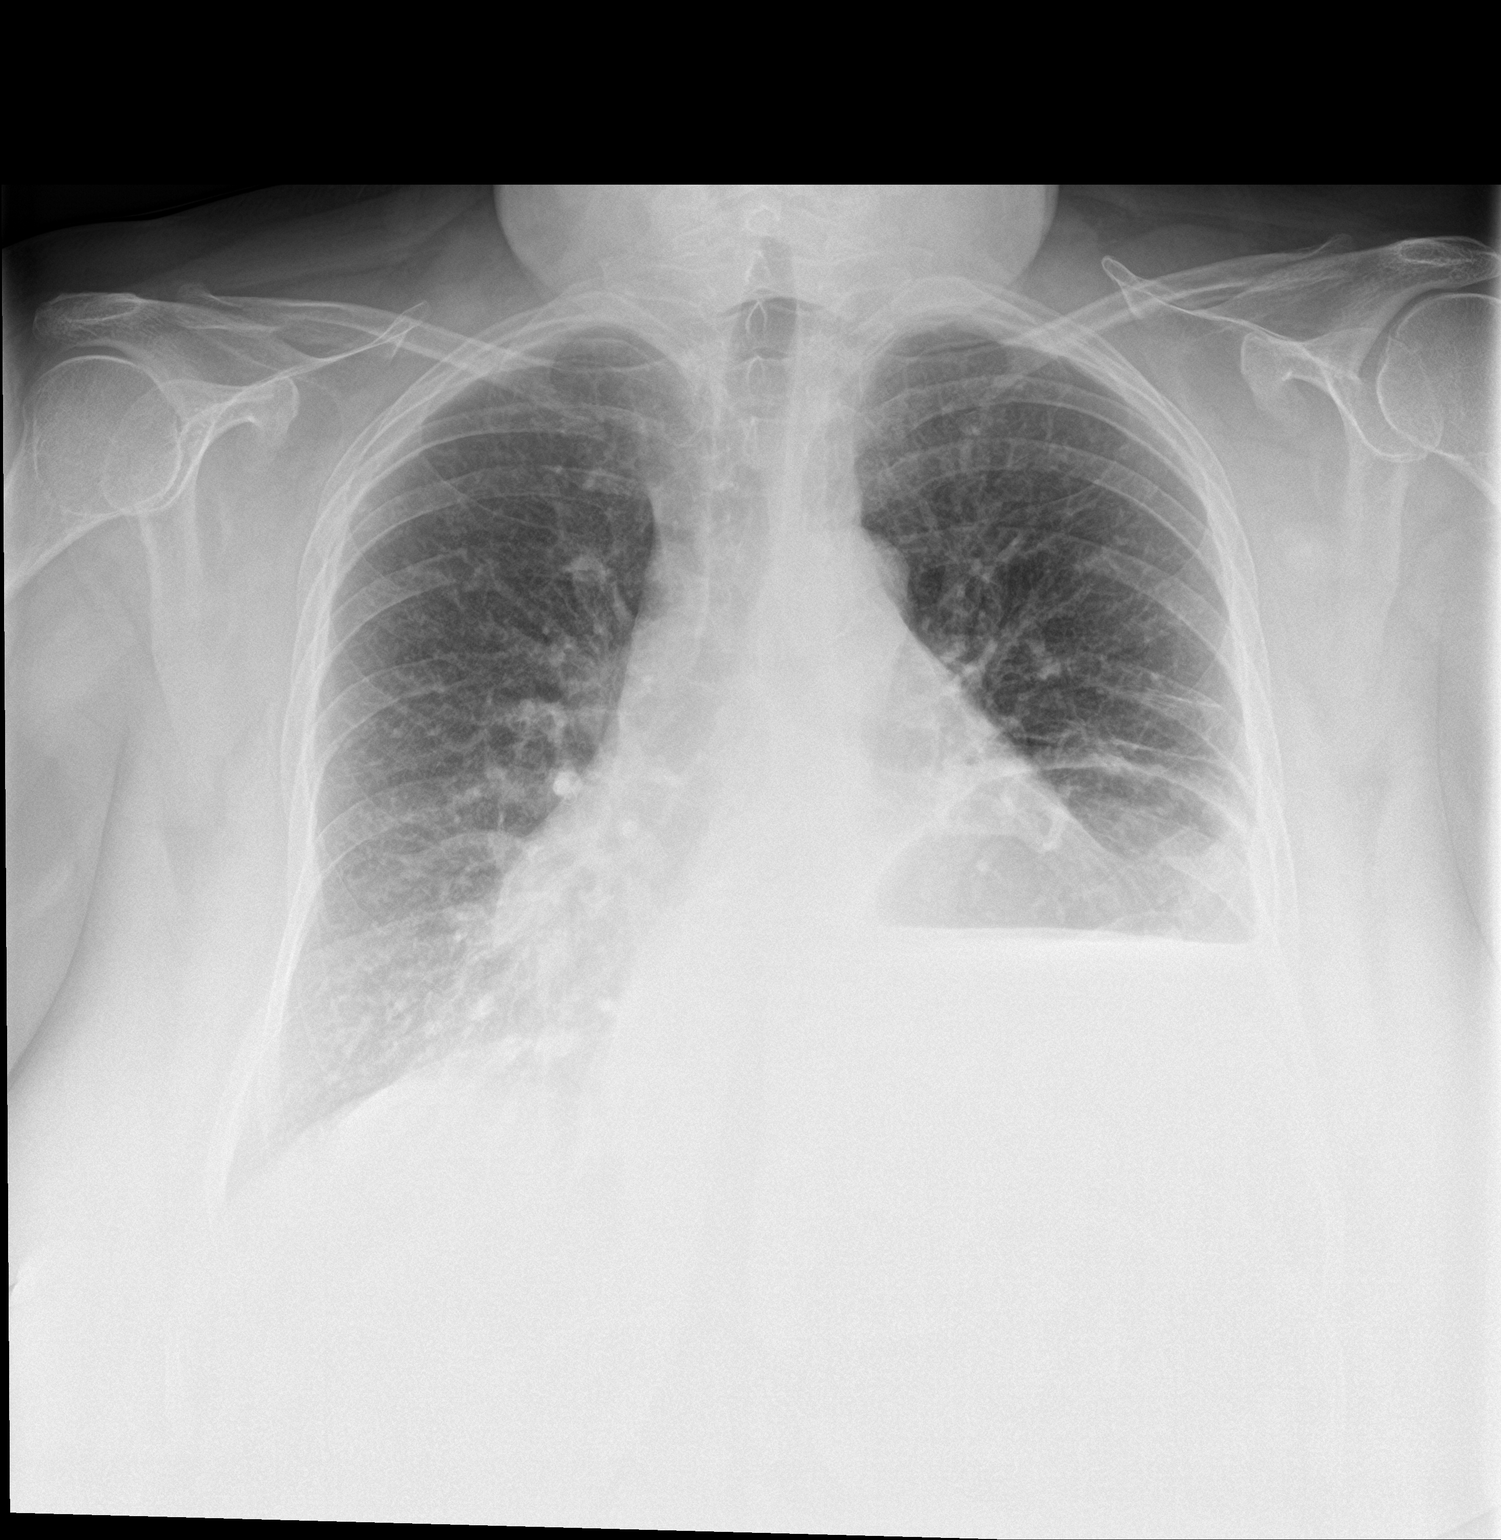

[chest lat]
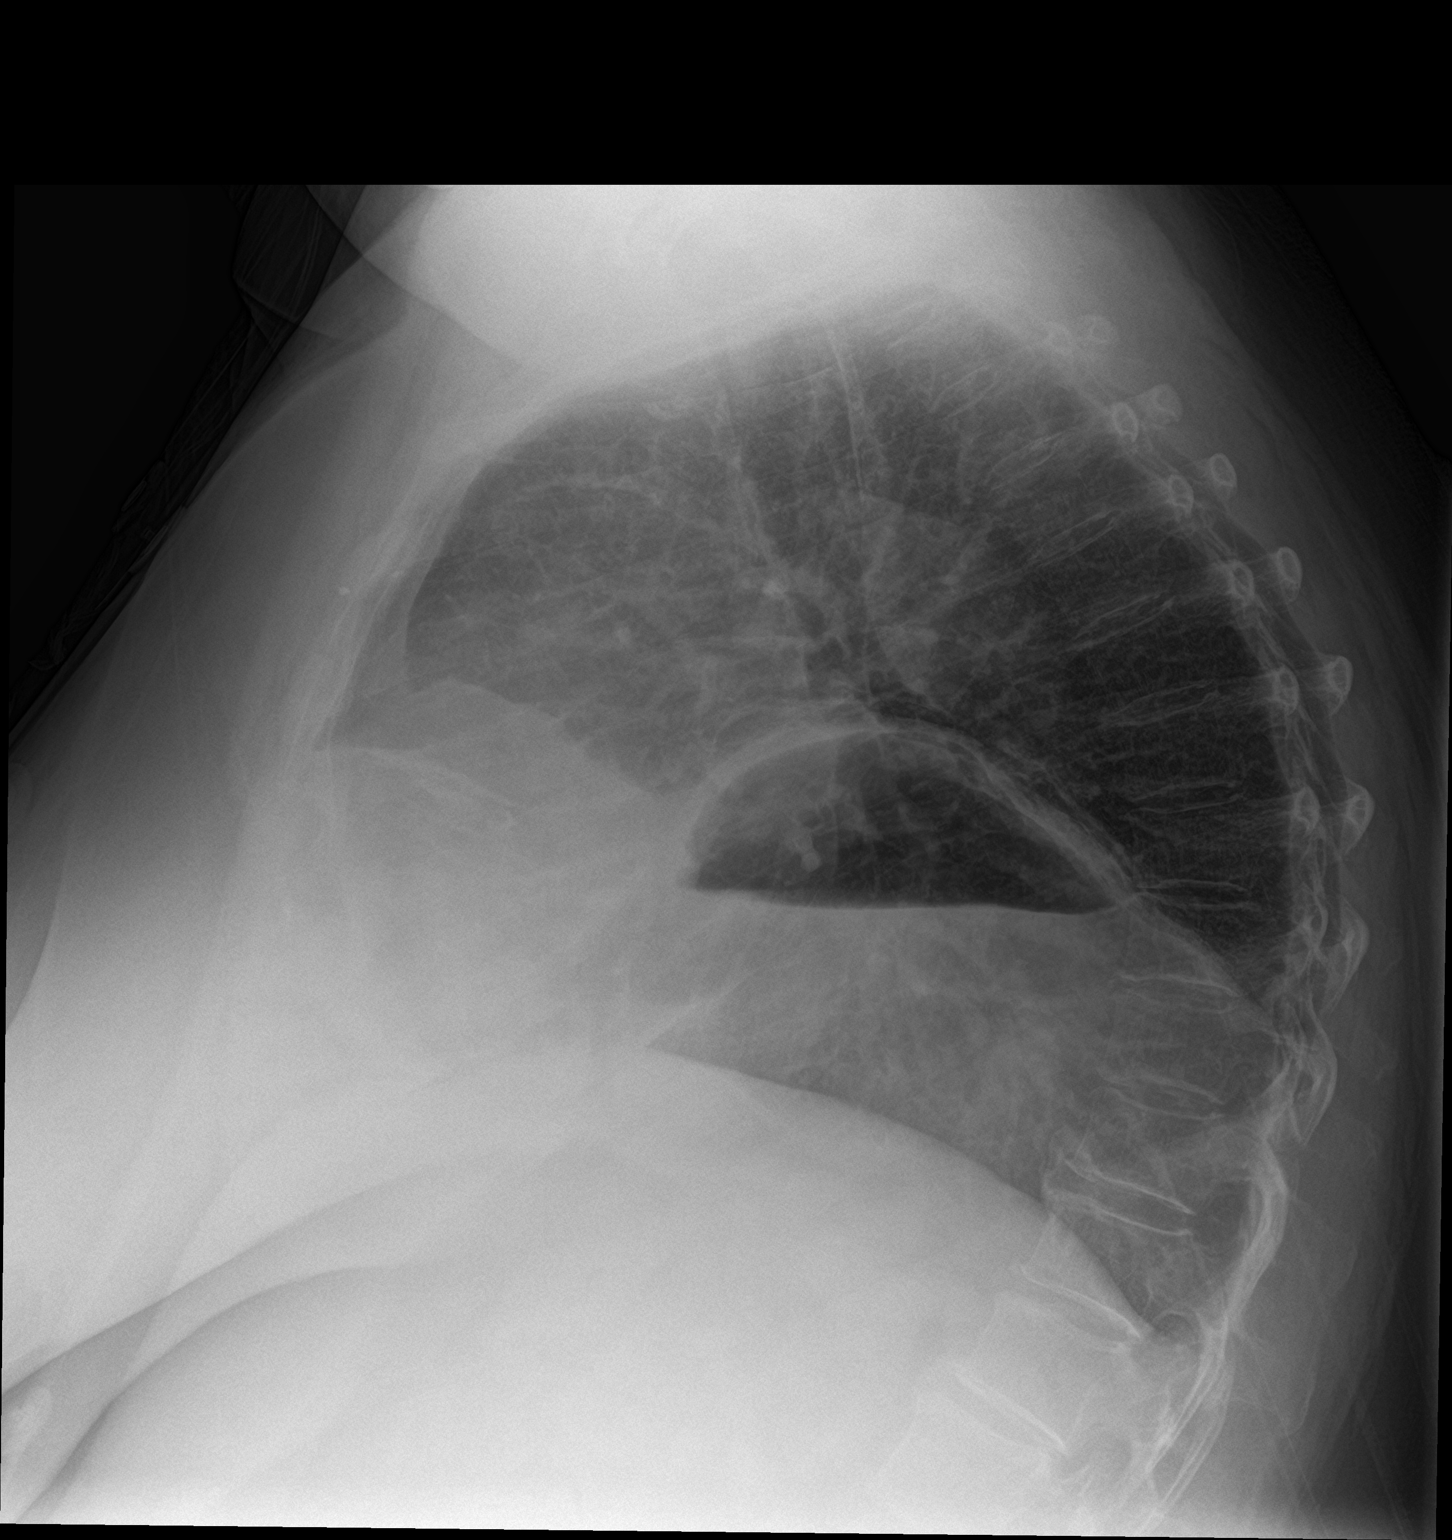

[2 of 2 positions shown; findings below may reference images not displayed]

FINDINGS: The heart is enlarged and stable in configuration. Elevated LEFT
hemidiaphragm is stable. There are no focal consolidations or
pleural effusions. No pulmonary edema.
IMPRESSION: Stable cardiomegaly.

## 2020-11-16 IMAGING — CT CT CHEST HIGH RESOLUTION W/O CM
2 of 6 series · 15 of 36 positions shown, 18 images · non-contrast
Comparison: 10/22/2019 chest radiograph.

CLINICAL DATA: Chronic dyspnea with exertion and nonproductive
cough for several months.

EXAM:
CT CHEST WITHOUT CONTRAST
TECHNIQUE: Multidetector CT imaging of the chest was performed following the
standard protocol without intravenous contrast. High resolution
imaging of the lungs, as well as inspiratory and expiratory imaging,
was performed.

[Series 2: high resolution · axial · 0.74mm/px · z∈[-440,-170]mm · 12 of 151 slices shown, 15 images]
[im 8/151  mediastinal]
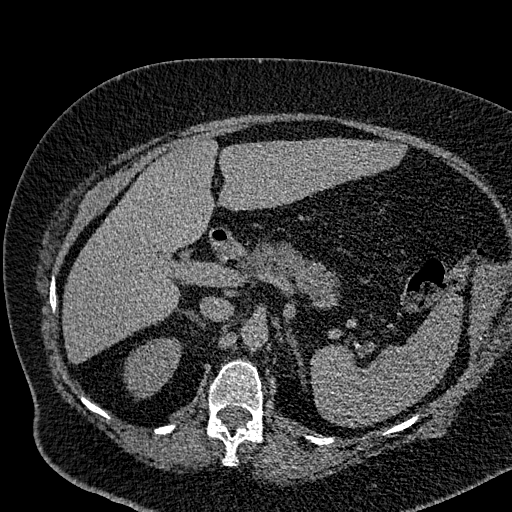
[im 8/151  lung]
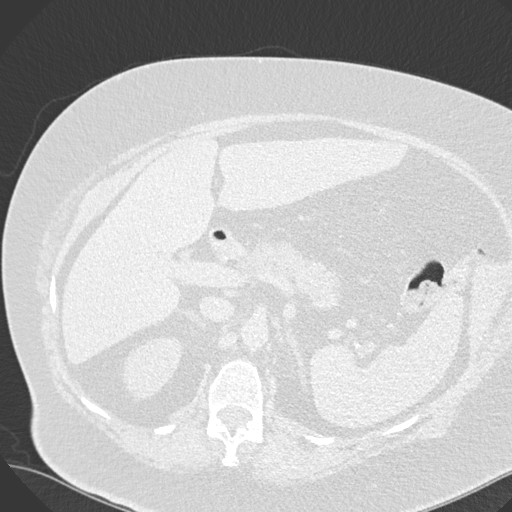
[im 23/151  lung]
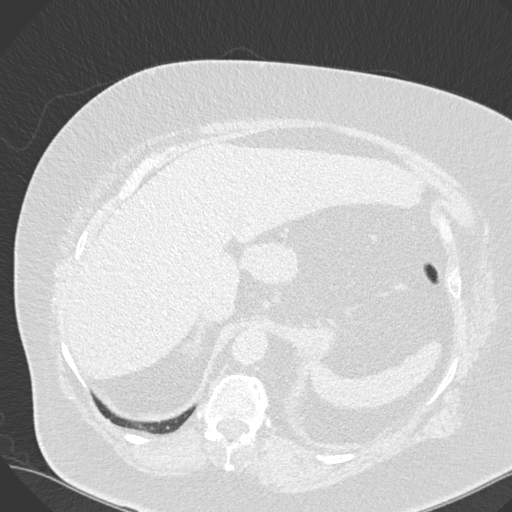
[im 31/151  lung]
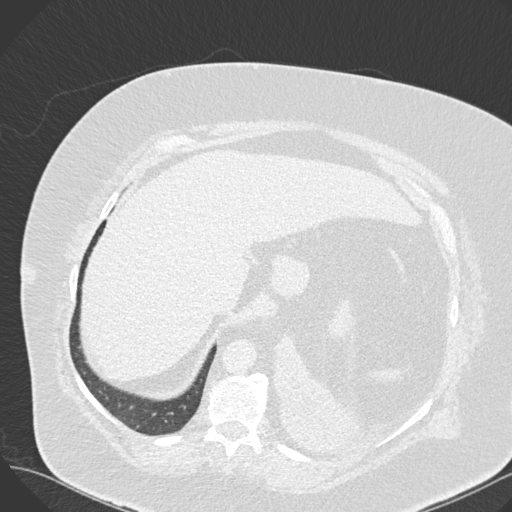
[im 46/151  lung]
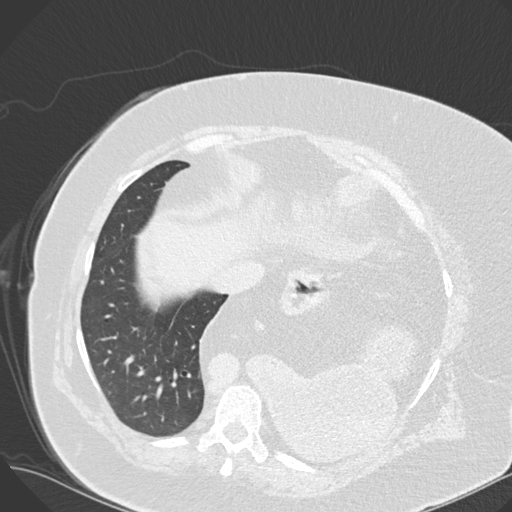
[im 61/151  mediastinal]
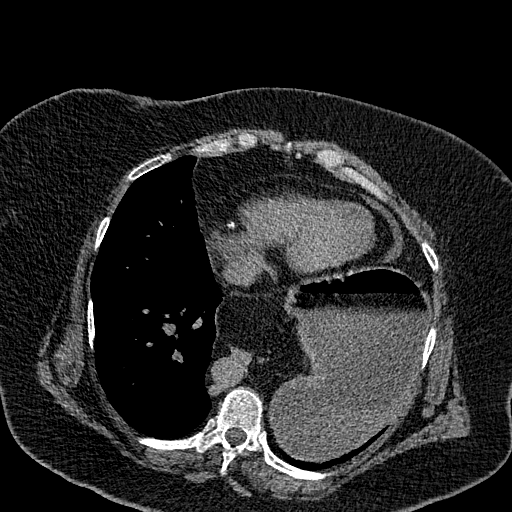
[im 61/151  lung]
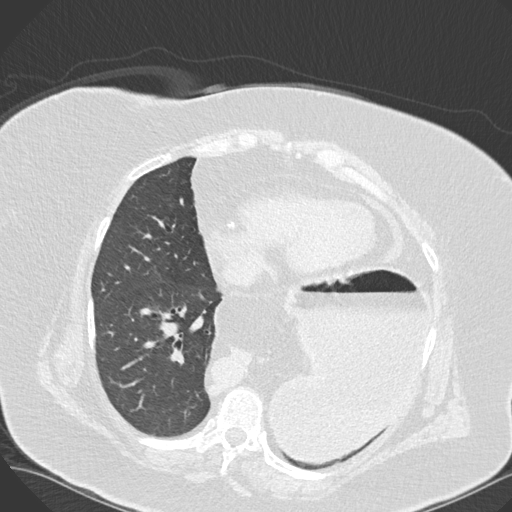
[im 68/151  lung]
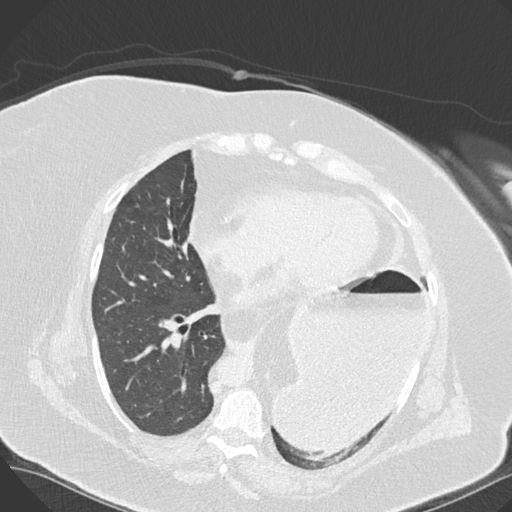
[im 83/151  lung]
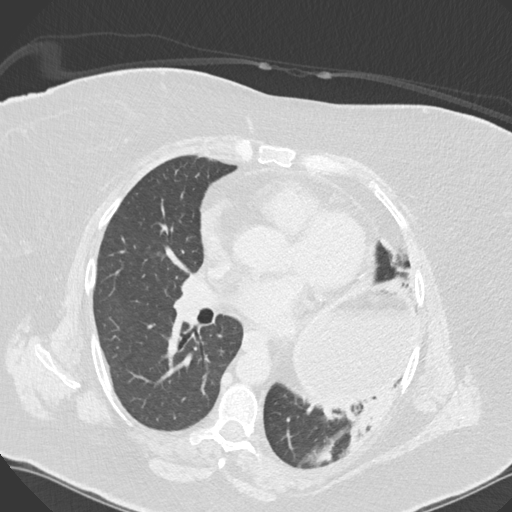
[im 91/151  lung]
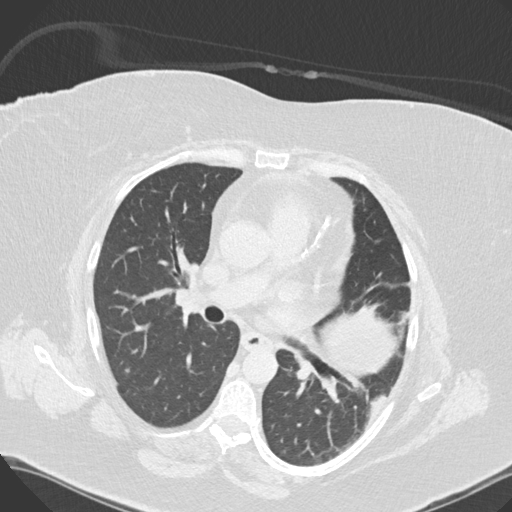
[im 106/151  mediastinal]
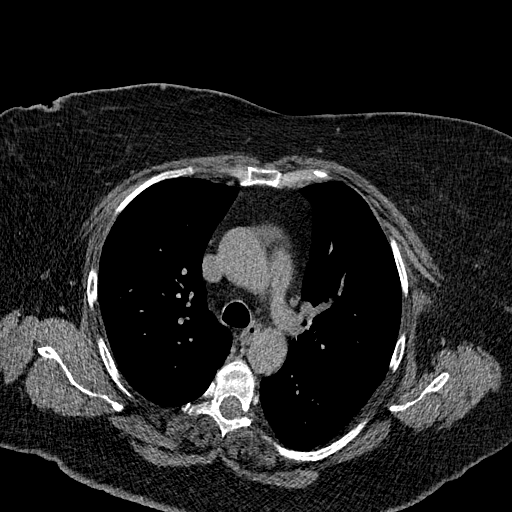
[im 106/151  lung]
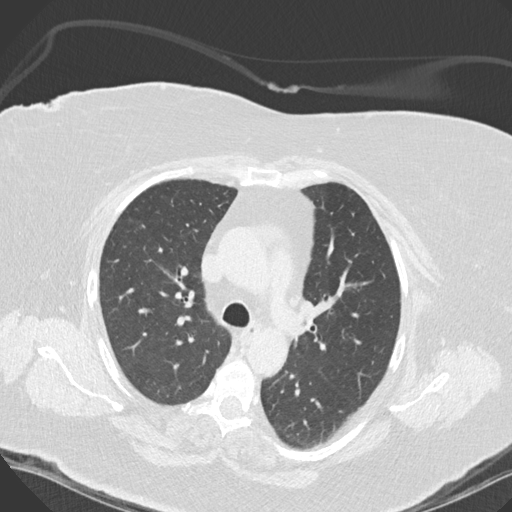
[im 121/151  lung]
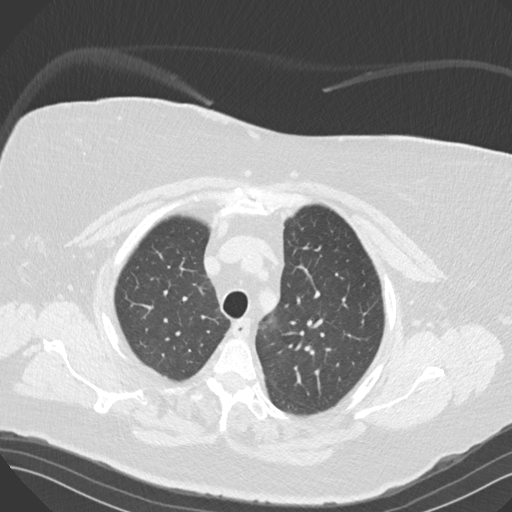
[im 128/151  lung]
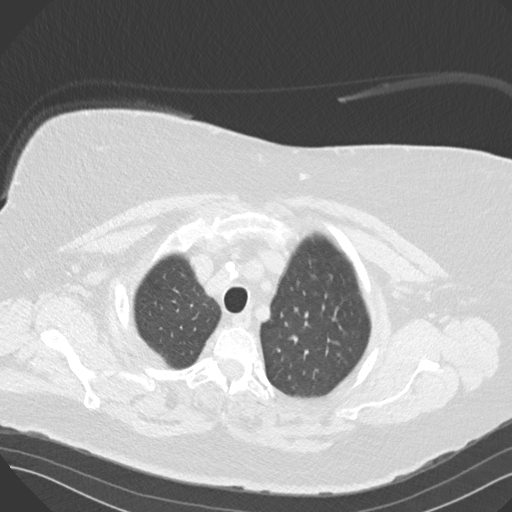
[im 143/151  lung]
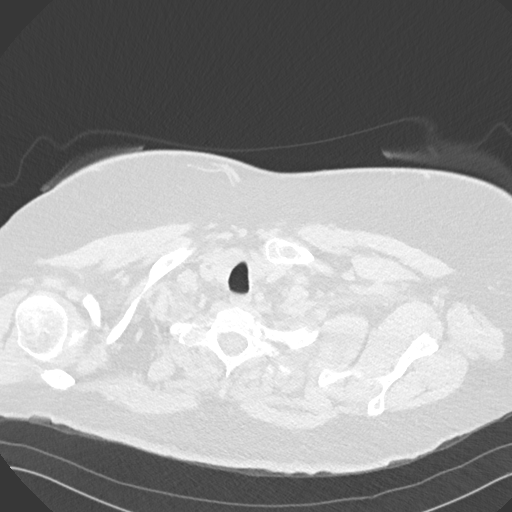

[Series 11: coronal · coronal · 0.62mm/px · 3 of 138 slices shown]
[im 28/138  lung]
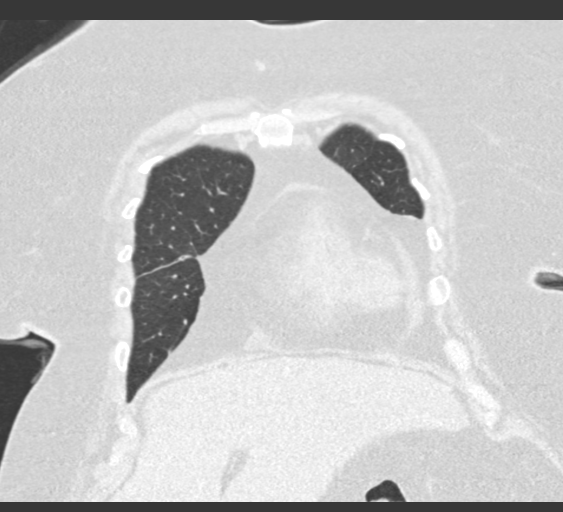
[im 55/138  lung]
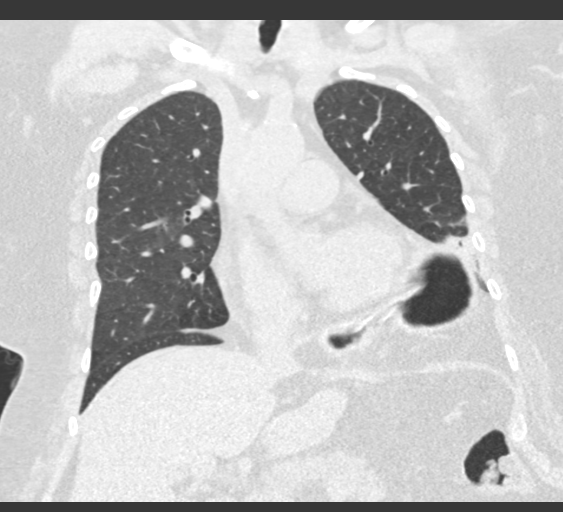
[im 83/138  lung]
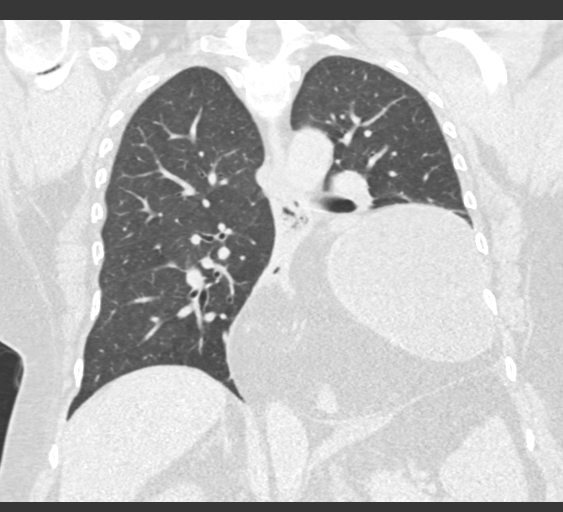

[15 of 36 positions shown; findings below may reference images not displayed]

FINDINGS: Cardiovascular: Normal heart size. Trace pericardial
effusion/thickening. Three-vessel coronary atherosclerosis.
Atherosclerotic nonaneurysmal thoracic aorta. Top-normal caliber
main pulmonary artery (3.2 cm diameter).

Mediastinum/Nodes: No discrete thyroid nodules. Unremarkable
esophagus. No pathologically enlarged axillary, mediastinal or hilar
lymph nodes, noting limited sensitivity for the detection of hilar
adenopathy on this noncontrast study.

Lungs/Pleura: No pneumothorax. No pleural effusion. Moderate
compressive atelectasis in the lingula and left lower lobe by the
large hiatal hernia. No acute consolidative airspace disease, lung
masses or significant pulmonary nodules. No significant regions of
subpleural reticulation, ground-glass attenuation, traction
bronchiectasis, architectural distortion or frank honeycombing.
Small perifissural plaques along the right major fissure (series
5/image 152). No significant air trapping or evidence of
tracheobronchomalacia on the expiration sequence.

Upper abdomen: Large hiatal hernia. Stomach is essentially entirely
intrathoracic. Mild diffuse hepatic steatosis.

Musculoskeletal: No aggressive appearing focal osseous lesions. Mild
thoracic spondylosis.
IMPRESSION: 1. Large hiatal hernia with associated compressive atelectasis at
the left lung base.
2. No evidence of interstitial lung disease.
3. Three-vessel coronary atherosclerosis.
4. Mild diffuse hepatic steatosis.

Aortic Atherosclerosis ([id number].A).

## 2020-11-28 ENCOUNTER — Ambulatory Visit (INDEPENDENT_AMBULATORY_CARE_PROVIDER_SITE_OTHER): Payer: Medicaid Other | Admitting: Family Medicine

## 2020-11-28 ENCOUNTER — Encounter (INDEPENDENT_AMBULATORY_CARE_PROVIDER_SITE_OTHER): Payer: Self-pay | Admitting: Family Medicine

## 2020-11-28 ENCOUNTER — Other Ambulatory Visit: Payer: Self-pay

## 2020-11-28 VITALS — BP 117/75 | HR 101 | Temp 98.1°F | Ht 63.0 in | Wt 236.0 lb

## 2020-11-28 DIAGNOSIS — Z6841 Body Mass Index (BMI) 40.0 and over, adult: Secondary | ICD-10-CM

## 2020-11-28 DIAGNOSIS — E559 Vitamin D deficiency, unspecified: Secondary | ICD-10-CM | POA: Diagnosis not present

## 2020-11-28 DIAGNOSIS — E1165 Type 2 diabetes mellitus with hyperglycemia: Secondary | ICD-10-CM

## 2020-11-28 DIAGNOSIS — E66813 Obesity, class 3: Secondary | ICD-10-CM

## 2020-11-28 MED ORDER — VITAMIN D (ERGOCALCIFEROL) 1.25 MG (50000 UNIT) PO CAPS: 50000.0000 [IU] | ORAL_CAPSULE | ORAL | 0 refills | Status: DC

## 2020-11-28 NOTE — Progress Notes (Signed)
Chief Complaint:   OBESITY Tracy Graves is here to discuss her progress with her obesity treatment plan along with follow-up of her obesity related diagnoses. Tracy Graves is on the Category 2 Plan and states she is following her eating plan approximately 95% of the time. Tracy Graves states she is walking for 45.  Today's visit was #: 5 Starting weight: 249 lbs Starting date: 09/12/2020 Today's weight: 236 lbs Today's date: 11/28/2020 Total lbs lost to date: 13 lbs Total lbs lost since last in-office visit: 2 lbs  Interim History: Tracy Graves had a great few weeks.  She says she had a low key holiday and has no plans for the next few weeks.  She is not doing many snacks and is getting in around 6 ounces of protein at dinner and 7.5 ounces at lunch.  Denies obstacles over the next few weeks.  Subjective:   1. Vitamin D deficiency Tracy Graves's Vitamin D level was 10.2 on 09/12/2020. She is currently taking prescription vitamin D 50,000 IU each week. She denies nausea, vomiting or muscle weakness.  She endorses fatigue.  2. Type 2 diabetes mellitus with hyperglycemia, without long-term current use of insulin (HCC) Medications reviewed. Diabetic ROS: no polyuria or polydipsia, no chest pain, dyspnea or TIA's, no numbness, tingling or pain in extremities.  She is taking metformin.  Denies GI side effects.  She has not gotten her flu shot yet.  Lab Results  Component Value Date   HGBA1C 5.6 09/12/2020   HGBA1C 5.3 12/24/2016   HGBA1C 11.2 (H) 06/05/2015   Lab Results  Component Value Date   LDLCALC 82 09/12/2020   CREATININE 0.85 09/12/2020   Lab Results  Component Value Date   INSULIN 41.6 (H) 09/12/2020   Assessment/Plan:   1. Vitamin D deficiency Low Vitamin D level contributes to fatigue and are associated with obesity, breast, and colon cancer. She agrees to continue to take prescription Vitamin D @50 ,000 IU every week and will follow-up for routine testing of Vitamin D, at least 2-3 times per year to  avoid over-replacement.  - Vitamin D, Ergocalciferol, (DRISDOL) 1.25 MG (50000 UNIT) CAPS capsule; Take 1 capsule (50,000 Units total) by mouth every 7 (seven) days.  Dispense: 4 capsule; Refill: 0  2. Type 2 diabetes mellitus with hyperglycemia, without long-term current use of insulin (HCC) Good blood sugar control is important to decrease the likelihood of diabetic complications such as nephropathy, neuropathy, limb loss, blindness, coronary artery disease, and death. Intensive lifestyle modification including diet, exercise and weight loss are the first line of treatment for diabetes.  Continue metformin.  No refill needed.  3. Class 3 severe obesity with serious comorbidity and body mass index (BMI) of 40.0 to 44.9 in adult, unspecified obesity type (HCC)  Tracy Graves is currently in the action stage of change. As such, her goal is to continue with weight loss efforts. She has agreed to the Category 2 Plan.   Exercise goals: All adults should avoid inactivity. Some physical activity is better than none, and adults who participate in any amount of physical activity gain some health benefits.  Behavioral modification strategies: increasing lean protein intake, meal planning and cooking strategies, keeping healthy foods in the home and holiday eating strategies .  Tracy Graves has agreed to follow-up with our clinic in 4 weeks. She was informed of the importance of frequent follow-up visits to maximize her success with intensive lifestyle modifications for her multiple health conditions.   Objective:   Blood pressure 117/75, pulse [name])  101, temperature 98.1 F (36.7 C), temperature source Oral, height 5\' 3"  (1.6 m), weight 236 lb (107 kg), SpO2 98 %. Body mass index is 41.81 kg/m.  General: Cooperative, alert, well developed, in no acute distress. HEENT: Conjunctivae and lids unremarkable. Cardiovascular: Regular rhythm.  Lungs: Normal work of breathing. Neurologic: No focal deficits.   Lab Results   Component Value Date   CREATININE 0.85 09/12/2020   BUN 12 09/12/2020   NA 138 09/12/2020   K 5.0 09/12/2020   CL 103 09/12/2020   CO2 18 (L) 09/12/2020   Lab Results  Component Value Date   ALT 16 09/12/2020   AST 16 09/12/2020   ALKPHOS 118 09/12/2020   BILITOT 0.3 09/12/2020   Lab Results  Component Value Date   HGBA1C 5.6 09/12/2020   HGBA1C 5.3 12/24/2016   HGBA1C 11.2 (H) 06/05/2015   Lab Results  Component Value Date   INSULIN 41.6 (H) 09/12/2020   Lab Results  Component Value Date   TSH 1.160 09/12/2020   Lab Results  Component Value Date   CHOL 155 09/12/2020   HDL 48 09/12/2020   LDLCALC 82 09/12/2020   TRIG 146 09/12/2020   Lab Results  Component Value Date   WBC 11.6 (H) 09/12/2020   HGB 17.3 (H) 09/12/2020   HCT 50.2 (H) 09/12/2020   MCV 90 09/12/2020   PLT 399 09/12/2020   Attestation Statements:   Reviewed by clinician on day of visit: allergies, medications, problem list, medical history, surgical history, family history, social history, and previous encounter notes.  I, 09/14/2020, CMA, am acting as transcriptionist for Insurance claims handler, MD. I have reviewed the above documentation for accuracy and completeness, and I agree with the above. - [name], MD

## 2020-12-09 ENCOUNTER — Ambulatory Visit: Payer: Medicaid Other | Attending: Internal Medicine

## 2020-12-09 DIAGNOSIS — Z23 Encounter for immunization: Secondary | ICD-10-CM

## 2020-12-09 NOTE — Progress Notes (Signed)
   Covid-19 Vaccination Clinic  Name:  DALLAS SCORSONE    MRN: [id number] DOB: 1961-09-23  12/09/2020  [name] was observed post Covid-19 immunization for 30 minutes based on pre-vaccination screening without incident. She was provided with Vaccine Information Sheet and instruction to access the V-Safe system.   Ms. Streight was instructed to call 911 with any severe reactions post vaccine: Marland Kitchen Difficulty breathing  . Swelling of face and throat  . A fast heartbeat  . A bad rash all over body  . Dizziness and weakness   Immunizations Administered    No immunizations on file.

## 2020-12-27 ENCOUNTER — Other Ambulatory Visit: Payer: Self-pay

## 2020-12-27 ENCOUNTER — Ambulatory Visit (INDEPENDENT_AMBULATORY_CARE_PROVIDER_SITE_OTHER): Payer: Medicaid Other | Admitting: Internal Medicine

## 2020-12-27 ENCOUNTER — Encounter: Payer: Self-pay | Admitting: Internal Medicine

## 2020-12-27 VITALS — BP 118/82 | HR 98 | Ht 63.0 in | Wt 243.4 lb

## 2020-12-27 DIAGNOSIS — E1169 Type 2 diabetes mellitus with other specified complication: Secondary | ICD-10-CM | POA: Insufficient documentation

## 2020-12-27 DIAGNOSIS — I152 Hypertension secondary to endocrine disorders: Secondary | ICD-10-CM

## 2020-12-27 DIAGNOSIS — E669 Obesity, unspecified: Secondary | ICD-10-CM

## 2020-12-27 DIAGNOSIS — E1159 Type 2 diabetes mellitus with other circulatory complications: Secondary | ICD-10-CM

## 2020-12-27 DIAGNOSIS — E119 Type 2 diabetes mellitus without complications: Secondary | ICD-10-CM

## 2020-12-27 DIAGNOSIS — E782 Mixed hyperlipidemia: Secondary | ICD-10-CM

## 2020-12-27 DIAGNOSIS — I700 Atherosclerosis of aorta: Secondary | ICD-10-CM

## 2020-12-27 DIAGNOSIS — G4733 Obstructive sleep apnea (adult) (pediatric): Secondary | ICD-10-CM

## 2020-12-27 NOTE — Progress Notes (Signed)
Cardiology Office Note:    Date:  12/27/2020   ID:  Tracy Graves, DOB 1961/05/11, MRN [id number]  PCP:  Patient, No Pcp Per  [facility] Cardiologist:  Christell Constant, MD  [facility] Electrophysiologist:  None   CC:  Follow up visit for SOB  History of Present Illness:    Tracy Graves is a 59 y.o. female with a hx of Morbid Obesity, DM with HTN, Aortic Atherosclerosis, Tobacco Abuse who presented for evaluation 09/21/20.  Received TTE without evidence of HCM (questionable prior echo) and NM Stress Test without evidence of ischemia.  Patient notes that she is doing pretty well.  Notes that she has had a 30 lbs weight loss.  Since last visit notes no changes.  Relevant interval testing or therapy include starting atorvastatin 10 mg PO daily.  There are no interval hospital/ED visit.    No chest pain or pressure.  No SOB, no worsening of shortness of breath, and no PND/Orthopnea.  No weight gain or leg swelling.  No palpitations or syncope.  Ambulatory blood pressure 117/80.  Past Medical History:  Diagnosis Date  . Abdominal hernia   . Abscess    chest wall, right breast  . Cellulitis   . Diabetes (HCC)   . History of kidney stones   . Hyperglycemia due to type 2 diabetes mellitus (HCC) 06/05/2015  . Hypertension   . Hypothyroid    2016  . Intertrigo   . Morbid obesity due to excess calories (HCC) 06/05/2015  . Multiple food allergies   . Shortness of breath   . Tobacco use     Past Surgical History:  Procedure Laterality Date  . IR GENERIC HISTORICAL  12/23/2016   IR NEPHROSTOMY PLACEMENT LEFT 12/23/2016 Berdine Dance, MD [facility]  . NEPHROLITHOTOMY Left 01/28/2017   Procedure: LEFT NEPHROLITHOTOMY PERCUTANEOUS;  Surgeon: Crist Fat, MD;  Location: [facility];  Service: Urology;  Laterality: Left;  . NEPHROSTOMY TUBE PLACEMENT ([facility] HX)  12/23/2016   Current Medications: Current Meds  Medication Sig  . Albuterol Sulfate (PROVENTIL HFA IN) as  needed (sob, wheezing, allergies).   Marland Kitchen atorvastatin (LIPITOR) 10 MG tablet Take 1 tablet (10 mg total) by mouth daily.  Marland Kitchen levothyroxine (SYNTHROID) 125 MCG tablet Take 125 mcg by mouth daily before breakfast.   . metFORMIN (GLUCOPHAGE) 500 MG tablet Take 500 mg by mouth daily with breakfast.   . metoprolol succinate (TOPROL-XL) 50 MG 24 hr tablet Take 50 mg by mouth daily.  . polyethylene glycol (MIRALAX / GLYCOLAX) 17 g packet Take 17 g by mouth daily.  . Vitamin D, Ergocalciferol, (DRISDOL) 1.25 MG (50000 UNIT) CAPS capsule Take 1 capsule (50,000 Units total) by mouth every 7 (seven) days.   Allergies:   Other   Social History   Socioeconomic History  . Marital status: Widowed    Spouse name: Not on file  . Number of children: Not on file  . Years of education: Not on file  . Highest education level: Not on file  Occupational History  . Occupation: Stay at Home  Tobacco Use  . Smoking status: Current Some Day Smoker    Packs/day: 0.75    Years: 23.00    Pack years: 17.25    Types: Cigarettes    Last attempt to quit: 10/23/2019    Years since quitting: 1.1  . Smokeless tobacco: Never Used  . Tobacco comment: pt stated smokes every now and then as of 06/09/20  Vaping Use  .  Vaping Use: Never used  Substance and Sexual Activity  . Alcohol use: No  . Drug use: No  . Sexual activity: Yes    Birth control/protection: None  Other Topics Concern  . Not on file  Social History Narrative  . Not on file   Social Determinants of Health   Financial Resource Strain: Not on file  Food Insecurity: Not on file  Transportation Needs: Not on file  Physical Activity: Not on file  Stress: Not on file  Social Connections: Not on file     Family History: The patient's family history includes Breast cancer in her mother; Cancer in her mother; Depression in her father; Diabetes in her father; Diabetes Mellitus II in her father; Heart attack in her father; Leukemia in her maternal  grandfather; Obesity in her father and mother; Ovarian cancer in her maternal aunt; Thyroid disease in her father.  SCD in the father (age 3) in the setting of significant tobacco use; Uncle on fathers side had HF, died at 74.  ROS:   Please see the history of present illness.    All other systems reviewed and are negative.  EKGs/Labs/Other Studies Reviewed:    The following studies were reviewed today:  EKG:   09/12/20 SR, 86, nonspecific TWI.  Transthoracic Echocardiogram: Date:10/04/20 Results: No evidence of HCM substrate on improved imaging. Normal systolic function 1. Left ventricular ejection fraction, by estimation, is 65 to 70%. The  left ventricle has normal function. The left ventricle has no regional  wall motion abnormalities. There is mild concentric left ventricular  hypertrophy. Left ventricular diastolic  parameters are consistent with Grade I diastolic dysfunction (impaired  relaxation).  2. Right ventricular systolic function is normal. The right ventricular  size is normal.  3. The mitral valve is normal in structure. Mild mitral valve  regurgitation. No evidence of mitral stenosis.  4. The aortic valve is normal in structure. Aortic valve regurgitation is  not visualized. No aortic stenosis is present.  5. The inferior vena cava is normal in size with greater than 50%  respiratory variability, suggesting right atrial pressure of 3 mmHg.   11/03/19: Notable Septal Hypertrophy septal measurement difficult but approximately 17 mm.  No significant LVOT gradient found.   NonCardiac CT: Date:11/03/2019 Results: LAD and LCx Calcium;no interstital lung disease  NM Stress Test 09/28/20  The left ventricular ejection fraction is hyperdynamic (>65%).  Nuclear stress EF: 67%.  No T wave inversion was noted during stress.  There was no ST segment deviation noted during stress.  Defect 1: There is a small defect of mild severity.  This is a low risk study.    Small size, mild intensity apical perfusion defect, improved with stress upright imaging, suggestive of attenuation artifact. No significant reversible ischemia. LVEF 67% with normal wall motion. This is a low risk study.   Recent Labs: 09/12/2020: ALT 16; BUN 12; Creatinine, Ser 0.85; Hemoglobin 17.3; Platelets 399; Potassium 5.0; Sodium 138; TSH 1.160  Recent Lipid Panel    Component Value Date/Time   CHOL 155 09/12/2020 1234   TRIG 146 09/12/2020 1234   HDL 48 09/12/2020 1234   LDLCALC 82 09/12/2020 1234    Physical Exam:    VS:  BP 118/82   Pulse 98   Ht 5\' 3"  (1.6 m)   Wt 243 lb 6.4 oz (110.4 kg)   SpO2 97%   BMI 43.12 kg/m     Wt Readings from Last 3 Encounters:  12/27/20 243 lb 6.4  oz (110.4 kg)  11/28/20 236 lb (107 kg)  11/02/20 238 lb (108 kg)    GEN: Obese, well nourished, well developed in no acute distress HEENT: Normal NECK: No JVD; No carotid bruits LYMPHATICS: No lymphadenopathy CARDIAC: RRR, II/VI systolic crescendo with no rubs/gallops RESPIRATORY:  Clear to auscultation without rales, wheezing or rhonchi  ABDOMEN: Soft, non-tender, non-distended MUSCULOSKELETAL:  No edema; No deformity  SKIN: Warm and dry NEUROLOGIC:  Alert and oriented x 3 PSYCHIATRIC:  Normal affect   ASSESSMENT:    1. Obesity, diabetes, and hypertension syndrome (HCC)   2. OSA (obstructive sleep apnea)   3. Aortic atherosclerosis (HCC)   4. Mixed hyperlipidemia    PLAN:    In order of problems listed above:  Obesity/DM/HTN OSA- seeing pulmonology - ambulatory blood pressure 125/80 at greatest, will continue ambulatory BP monitoring; gave education on how to perform ambulatory blood pressure monitoring including the frequency and technique; goal ambulatory blood pressure < 135/85 on average - continue home medications  - OSA in the past, but improved with weight loss - discussed diet (DASH/low sodium), and exercise/weight loss interventions   Aortic Atherosclerosis   Hyperlipidemia (mixed) -LDL goal less than 70 -continue current statin -Recheck lipid profile and LFTs (planned for next month with bariatrics) - gave education on dietary changes - Ok to start exercise program  Tobacco Abuse- Down to two cigarettes a day - discussed the dangers of tobacco use, both inhaled and oral, which include, but are not limited to cardiovascular disease, increased cancer risk of multiple types of cancer, COPD, peripheral arterial disease, strokes. - counseled on the benefits of smoking cessation. - firmly advised to quit.  - we also reviewed strategies to maximize success, including:  Removing cigarettes and smoking materials from environment   Stress management  Substitution of other forms of reinforcement (the one cigarette a day approach)  Support of family/friends and group smoking cessation  Selecting a quit date  Patient provided contact information for [facility] or 1-800-QUIT-NOW  Patient provided with [employer] free smoking cessation classes: ([phone] [phone] and [place]   6 months follow up unless new symptoms or abnormal test results warranting change in plan  Would be reasonable for  Virtual Follow up  Would be reasonable for  APP Follow up   Medication Adjustments/Labs and Tests Ordered: Current medicines are reviewed at length with the patient today.  Concerns regarding medicines are outlined above.  No orders of the defined types were placed in this encounter.  No orders of the defined types were placed in this encounter.   There are no Patient Instructions on file for this visit.   Signed, Christell Constant, MD  12/27/2020 1:31 PM    [facility]

## 2020-12-27 NOTE — Patient Instructions (Signed)
Medication Instructions:  Your physician recommends that you continue on your current medications as directed. Please refer to the Current Medication list given to you today.  *If you need a refill on your cardiac medications before your next appointment, please call your pharmacy*   Lab Work: None ordered  If you have labs (blood work) drawn today and your tests are completely normal, you will receive your results only by: . MyChart Message (if you have MyChart) OR . A paper copy in the mail If you have any lab test that is abnormal or we need to change your treatment, we will call you to review the results.   Testing/Procedures: None ordered   Follow-Up: At [facility], you and your health needs are our priority.  As part of our continuing mission to provide you with exceptional heart care, we have created designated Provider Care Teams.  These Care Teams include your primary Cardiologist (physician) and Advanced Practice Providers (APPs -  Physician Assistants and Nurse Practitioners) who all work together to provide you with the care you need, when you need it.  We recommend signing up for the patient portal called "MyChart".  Sign up information is provided on this After Visit Summary.  MyChart is used to connect with patients for Virtual Visits (Telemedicine).  Patients are able to view lab/test results, encounter notes, upcoming appointments, etc.  Non-urgent messages can be sent to your provider as well.   To learn more about what you can do with MyChart, go to [email or web address].    Your next appointment:   6 month(s)  The format for your next appointment:   In Person  Provider:   Mahesh Chandrasekhar, MD   Other Instructions   

## 2021-01-03 ENCOUNTER — Encounter (INDEPENDENT_AMBULATORY_CARE_PROVIDER_SITE_OTHER): Payer: Self-pay | Admitting: Family Medicine

## 2021-01-03 ENCOUNTER — Telehealth (INDEPENDENT_AMBULATORY_CARE_PROVIDER_SITE_OTHER): Payer: Medicaid Other | Admitting: Family Medicine

## 2021-01-03 ENCOUNTER — Telehealth (INDEPENDENT_AMBULATORY_CARE_PROVIDER_SITE_OTHER): Payer: Self-pay

## 2021-01-03 DIAGNOSIS — Z6841 Body Mass Index (BMI) 40.0 and over, adult: Secondary | ICD-10-CM

## 2021-01-03 DIAGNOSIS — E1165 Type 2 diabetes mellitus with hyperglycemia: Secondary | ICD-10-CM

## 2021-01-03 DIAGNOSIS — E559 Vitamin D deficiency, unspecified: Secondary | ICD-10-CM | POA: Diagnosis not present

## 2021-01-03 MED ORDER — VITAMIN D (ERGOCALCIFEROL) 1.25 MG (50000 UNIT) PO CAPS: 50000.0000 [IU] | ORAL_CAPSULE | ORAL | 0 refills | Status: DC

## 2021-01-03 NOTE — Telephone Encounter (Signed)
I connected with  Tracy Graves on 01/03/21 by a video enabled telemedicine application and verified that I am speaking with the correct person using two identifiers.   I discussed the limitations of evaluation and management by telemedicine. The patient expressed understanding and agreed to proceed.

## 2021-01-09 ENCOUNTER — Encounter (INDEPENDENT_AMBULATORY_CARE_PROVIDER_SITE_OTHER): Payer: Self-pay | Admitting: Family Medicine

## 2021-01-09 ENCOUNTER — Telehealth: Payer: Medicaid Other | Admitting: Family

## 2021-01-09 DIAGNOSIS — W5503XA Scratched by cat, initial encounter: Secondary | ICD-10-CM | POA: Diagnosis not present

## 2021-01-09 DIAGNOSIS — R21 Rash and other nonspecific skin eruption: Secondary | ICD-10-CM

## 2021-01-09 MED ORDER — CEPHALEXIN 500 MG PO CAPS: 500.0000 mg | ORAL_CAPSULE | Freq: Four times a day (QID) | ORAL | 0 refills | Status: DC

## 2021-01-09 MED ORDER — MUPIROCIN CALCIUM 2 % EX CREA: 1.0000 "application " | TOPICAL_CREAM | Freq: Two times a day (BID) | CUTANEOUS | 0 refills | Status: DC

## 2021-01-09 NOTE — Progress Notes (Signed)
E Visit for Cellulitis  We are sorry that you are not feeling well. Here is how we plan to help!  Based on what you shared with me it looks like you have cellulitis.  Cellulitis looks like areas of skin redness, swelling, and warmth; it develops as a result of bacteria entering under the skin. Little red spots and/or bleeding can be seen in skin, and tiny surface sacs containing fluid can occur. Fever can be present. Cellulitis is almost always on one side of a body, and the lower limbs are the most common site of involvement.   I have prescribed:  Keflex 500mg  take one by mouth four times a day for 5 days. I have also sent over Bactroban ointment you will use twice a day.   HOME CARE:  . Take your medications as ordered and take all of them, even if the skin irritation appears to be healing.   GET HELP RIGHT AWAY IF:  . Symptoms that don't begin to go away within 48 hours. . Severe redness persists or worsens . If the area turns color, spreads or swells. . If it blisters and opens, develops yellow-brown crust or bleeds. . You develop a fever or chills. . If the pain increases or becomes unbearable.  . Are unable to keep fluids and food down.  MAKE SURE YOU    Understand these instructions.  Will watch your condition.  Will get help right away if you are not doing well or get worse.  Thank you for choosing an e-visit. Your e-visit answers were reviewed by a board certified advanced clinical practitioner to complete your personal care plan. Depending upon the condition, your plan could have included both over the counter or prescription medications. Please review your pharmacy choice. Make sure the pharmacy is open so you can pick up prescription now. If there is a problem, you may contact your provider through and have the prescription routed to another pharmacy. Your safety is important to [employer]. If you have drug allergies check your prescription carefully.  For the  next 24 hours you can use MyChart to ask questions about today's visit, request a non-urgent call back, or ask for a work or school excuse. You will get an email in the next two days asking about your experience. I hope that your e-visit has been valuable and will speed your recovery.  Approximately 5 minutes was spent documenting and reviewing patient's chart.

## 2021-01-12 NOTE — Progress Notes (Signed)
TeleHealth Visit:  Due to the COVID-19 pandemic, this visit was completed with telemedicine (audio/video) technology to reduce patient and provider exposure as well as to preserve personal protective equipment.   Tracy Graves has verbally consented to this TeleHealth visit. The patient is located at home, the provider is located at the [facility] office. The participants in this visit include the listed provider and patient. The visit was conducted today partially by Mychart Video.  Tracy Graves was unable to use realtime audiovisual technology today and the telehealth visit was conducted via telephone due to power issues that could not complete full mychart visit.  Chief Complaint: OBESITY Tracy Graves is here to discuss her progress with her obesity treatment plan along with follow-up of her obesity related diagnoses. Tracy Graves is on Category 2 and states she is following her eating plan approximately 85% of the time. Tracy Graves states she is walking 30 minutes 3 times per week and doing leg lifts and arm and leg resistance for 20 minutes for 3-4 times per week.  Today's visit was #: 6 Starting weight: 249 lbs Starting date: 09/12/20  Interim History: Tracy Graves had a great holiday season except for yesterday, when her daughter got stuck on a train for over 11 hours. She denies cravings or hunger. Tracy Graves was following the plan through out the holiday season. Tracy Graves is concerned about losing weight at a good pace.  Subjective:   1. Vitamin D deficiency Tracy Graves's Vitamin D level was 10.2 on 09/12/20. She is currently taking prescription Vitamin D. She notes fatigue and denies nausea, vomiting or muscle weakness.  2. Type 2 diabetes mellitus with hyperglycemia, without long-term current use of insulin (HCC) Medications reviewed. Tracy Graves's last A1c was 5.6 and her Insulin was 41.6 on 09/12/20. Tracy Graves is taking metformin.  Lab Results  Component Value Date   HGBA1C 5.6 09/12/2020   HGBA1C 5.3 12/24/2016   HGBA1C  11.2 (H) 06/05/2015   Lab Results  Component Value Date   LDLCALC 82 09/12/2020   CREATININE 0.85 09/12/2020   Lab Results  Component Value Date   INSULIN 41.6 (H) 09/12/2020    Assessment/Plan:   1. Vitamin D deficiency Low Vitamin D level contributes to fatigue and are associated with obesity, breast, and colon cancer. She agrees to continue to take prescription Vitamin D @50 ,000 IU every week #4 with no refills and will follow-up for routine testing of Vitamin D, at least 2-3 times per year to avoid over-replacement.  - Vitamin D, Ergocalciferol, (DRISDOL) 1.25 MG (50000 UNIT) CAPS capsule; Take 1 capsule (50,000 Units total) by mouth every 7 (seven) days.  Dispense: 4 capsule; Refill: 0  2. Type 2 diabetes mellitus with hyperglycemia, without long-term current use of insulin (HCC) Good blood sugar control is important to decrease the likelihood of diabetic complications such as nephropathy, neuropathy, limb loss, blindness, coronary artery disease, and death. Intensive lifestyle modification including diet, exercise and weight loss are the first line of treatment for diabetes. Tracy Graves will consider a GLP-1 for her next appointment.  3. Class 3 severe obesity with serious comorbidity and body mass index (BMI) of 40.0 to 44.9 in adult, unspecified obesity type (HCC)  Tracy Graves is currently in the action stage of change. As such, her goal is to continue her weight loss efforts. She has agreed to follow the Category 2 plan.  Exercise goals: As is.  Behavioral modification strategies: increasing lean protein, meal planning and cooking strategies, keeping healthy food in the home, and planning for success.  Tracy Graves has agreed to follow-up with our clinic in 2 weeks. She was informed of the importance of frequent follow-up visits to maximize her success with intensive lifestyle modifications for her multiple health conditions.   Objective:   VITALS: Per patient if applicable, see  vitals. GENERAL: Alert and in no acute distress. CARDIOPULMONARY: No increased WOB. Speaking in clear sentences.  PSYCH: Pleasant and cooperative. Speech normal rate and rhythm. Affect is appropriate. Insight and judgement are appropriate. Attention is focused, linear, and appropriate.  NEURO: Oriented as arrived to appointment on time with no prompting.   Lab Results  Component Value Date   CREATININE 0.85 09/12/2020   BUN 12 09/12/2020   NA 138 09/12/2020   K 5.0 09/12/2020   CL 103 09/12/2020   CO2 18 (L) 09/12/2020   Lab Results  Component Value Date   ALT 16 09/12/2020   AST 16 09/12/2020   ALKPHOS 118 09/12/2020   BILITOT 0.3 09/12/2020   Lab Results  Component Value Date   HGBA1C 5.6 09/12/2020   HGBA1C 5.3 12/24/2016   HGBA1C 11.2 (H) 06/05/2015   Lab Results  Component Value Date   INSULIN 41.6 (H) 09/12/2020   Lab Results  Component Value Date   TSH 1.160 09/12/2020   Lab Results  Component Value Date   CHOL 155 09/12/2020   HDL 48 09/12/2020   LDLCALC 82 09/12/2020   TRIG 146 09/12/2020   Lab Results  Component Value Date   WBC 11.6 (H) 09/12/2020   HGB 17.3 (H) 09/12/2020   HCT 50.2 (H) 09/12/2020   MCV 90 09/12/2020   PLT 399 09/12/2020   No results found for: IRON, TIBC, FERRITIN  Attestation Statements:   Reviewed by clinician on day of visit: allergies, medications, problem list, medical history, surgical history, family history, social history, and previous encounter notes.  IKirke Corin, CMA, am acting as transcriptionist for [name], MD  I have reviewed the above documentation for accuracy and completeness, and I agree with the above. - Katherina Mires, MD

## 2021-01-17 ENCOUNTER — Encounter (INDEPENDENT_AMBULATORY_CARE_PROVIDER_SITE_OTHER): Payer: Self-pay | Admitting: Family Medicine

## 2021-01-17 ENCOUNTER — Telehealth (INDEPENDENT_AMBULATORY_CARE_PROVIDER_SITE_OTHER): Payer: Medicaid Other | Admitting: Family Medicine

## 2021-01-17 DIAGNOSIS — E1165 Type 2 diabetes mellitus with hyperglycemia: Secondary | ICD-10-CM

## 2021-01-17 DIAGNOSIS — Z6841 Body Mass Index (BMI) 40.0 and over, adult: Secondary | ICD-10-CM | POA: Diagnosis not present

## 2021-01-17 DIAGNOSIS — Z720 Tobacco use: Secondary | ICD-10-CM | POA: Diagnosis not present

## 2021-01-19 NOTE — Progress Notes (Signed)
TeleHealth Visit:  Due to the COVID-19 pandemic, this visit was completed with telemedicine (audio/video) technology to reduce patient and provider exposure as well as to preserve personal protective equipment.   Baudelia has verbally consented to this TeleHealth visit. The patient is located at home, the provider is located at the [facility] office. The participants in this visit include the listed provider and patient. The visit was conducted today via MyChart Video.   Chief Complaint: OBESITY Tracy Graves is here to discuss her progress with her obesity treatment plan along with follow-up of her obesity related diagnoses. Tracy Graves is on the Category 2 Plan and states she is following her eating plan approximately 95% of the time. Tracy Graves states she is doing resistance 50-60 minutes 7 times per week.  Today's visit was #: 7 Starting weight: 249 lbs Starting date: 09/12/2020  Interim History: Tracy Graves states her clothes are fitting looser. She is trying to increase resistance training. Tracy Graves has followed Cat 2 and has not been snacking due to lack of hunger. She is getting upwards toward 8 oz of meat at dinner.  Subjective:   1. Type 2 diabetes mellitus with hyperglycemia, without long-term current use of insulin (HCC) Tracy Graves is on metformin. Last A1c was 5.6 and insulin 41.6. Tracy Graves denies GI side effects.  2. Tobacco abuse Tracy Graves is still smoking a few cigarettes daily. She wants to quit.  Assessment/Plan:   1. Type 2 diabetes mellitus with hyperglycemia, without long-term current use of insulin (HCC) Good blood sugar control is important to decrease the likelihood of diabetic complications such as nephropathy, neuropathy, limb loss, blindness, coronary artery disease, and death. Intensive lifestyle modification including diet, exercise and weight loss are the first line of treatment for diabetes. We will do labs at next appointment. Tracy Graves will continue metformin.  2. Tobacco  abuse Will revisit at next appointment.  3. Class 3 severe obesity with serious comorbidity and body mass index (BMI) of 40.0 to 44.9 in adult, unspecified obesity type (HCC)  Tracy Graves is currently in the action stage of change. As such, her goal is to continue with weight loss efforts. She has agreed to the Category 2 Plan.   Exercise goals: As is.  Behavioral modification strategies: increasing lean protein intake, meal planning and cooking strategies, keeping healthy foods in the home and planning for success.  Tracy Graves has agreed to follow-up with our clinic in 2 weeks on 02/02/2021 at 2:40 pm. She was informed of the importance of frequent follow-up visits to maximize her success with intensive lifestyle modifications for her multiple health conditions.   Objective:   VITALS: Per patient if applicable, see vitals. GENERAL: Alert and in no acute distress. CARDIOPULMONARY: No increased WOB. Speaking in clear sentences.  PSYCH: Pleasant and cooperative. Speech normal rate and rhythm. Affect is appropriate. Insight and judgement are appropriate. Attention is focused, linear, and appropriate.  NEURO: Oriented as arrived to appointment on time with no prompting.   Lab Results  Component Value Date   CREATININE 0.85 09/12/2020   BUN 12 09/12/2020   NA 138 09/12/2020   K 5.0 09/12/2020   CL 103 09/12/2020   CO2 18 (L) 09/12/2020   Lab Results  Component Value Date   ALT 16 09/12/2020   AST 16 09/12/2020   ALKPHOS 118 09/12/2020   BILITOT 0.3 09/12/2020   Lab Results  Component Value Date   HGBA1C 5.6 09/12/2020   HGBA1C 5.3 12/24/2016   HGBA1C 11.2 (H) 06/05/2015  Lab Results  Component Value Date   INSULIN 41.6 (H) 09/12/2020   Lab Results  Component Value Date   TSH 1.160 09/12/2020   Lab Results  Component Value Date   CHOL 155 09/12/2020   HDL 48 09/12/2020   LDLCALC 82 09/12/2020   TRIG 146 09/12/2020   Lab Results  Component Value Date   WBC 11.6 (H)  09/12/2020   HGB 17.3 (H) 09/12/2020   HCT 50.2 (H) 09/12/2020   MCV 90 09/12/2020   PLT 399 09/12/2020   No results found for: IRON, TIBC, FERRITIN  Attestation Statements:   Reviewed by clinician on day of visit: allergies, medications, problem list, medical history, surgical history, family history, social history, and previous encounter notes.  I, Delorse Limber, am acting as transcriptionist for [name], MD.  I have reviewed the above documentation for accuracy and completeness, and I agree with the above. - Katherina Mires, MD

## 2021-02-02 ENCOUNTER — Ambulatory Visit (INDEPENDENT_AMBULATORY_CARE_PROVIDER_SITE_OTHER): Payer: Medicaid Other | Admitting: Family Medicine

## 2021-02-14 ENCOUNTER — Encounter (INDEPENDENT_AMBULATORY_CARE_PROVIDER_SITE_OTHER): Payer: Self-pay | Admitting: Family Medicine

## 2021-02-14 ENCOUNTER — Ambulatory Visit (INDEPENDENT_AMBULATORY_CARE_PROVIDER_SITE_OTHER): Payer: Medicaid Other | Admitting: Family Medicine

## 2021-02-14 ENCOUNTER — Other Ambulatory Visit: Payer: Self-pay

## 2021-02-14 VITALS — BP 117/79 | HR 90 | Temp 97.4°F | Ht 63.0 in | Wt 234.0 lb

## 2021-02-14 DIAGNOSIS — E1165 Type 2 diabetes mellitus with hyperglycemia: Secondary | ICD-10-CM

## 2021-02-14 DIAGNOSIS — E785 Hyperlipidemia, unspecified: Secondary | ICD-10-CM

## 2021-02-14 DIAGNOSIS — E559 Vitamin D deficiency, unspecified: Secondary | ICD-10-CM

## 2021-02-14 DIAGNOSIS — Z6841 Body Mass Index (BMI) 40.0 and over, adult: Secondary | ICD-10-CM

## 2021-02-14 DIAGNOSIS — E1169 Type 2 diabetes mellitus with other specified complication: Secondary | ICD-10-CM | POA: Diagnosis not present

## 2021-02-14 DIAGNOSIS — Z720 Tobacco use: Secondary | ICD-10-CM

## 2021-02-14 MED ORDER — VITAMIN D (ERGOCALCIFEROL) 1.25 MG (50000 UNIT) PO CAPS: 50000.0000 [IU] | ORAL_CAPSULE | ORAL | 0 refills | Status: DC

## 2021-02-15 LAB — HEMOGLOBIN A1C
Est. average glucose Bld gHb Est-mCnc: 111 mg/dL
Hgb A1c MFr Bld: 5.5 % (ref 4.8–5.6)

## 2021-02-15 LAB — COMPREHENSIVE METABOLIC PANEL
ALT: 17 IU/L (ref 0–32)
AST: 16 IU/L (ref 0–40)
Albumin/Globulin Ratio: 1.6 (ref 1.2–2.2)
Albumin: 4.6 g/dL (ref 3.8–4.9)
Alkaline Phosphatase: 118 IU/L (ref 44–121)
BUN/Creatinine Ratio: 16 (ref 9–23)
BUN: 13 mg/dL (ref 6–24)
Bilirubin Total: 0.4 mg/dL (ref 0.0–1.2)
CO2: 23 mmol/L (ref 20–29)
Calcium: 11 mg/dL — ABNORMAL HIGH (ref 8.7–10.2)
Chloride: 100 mmol/L (ref 96–106)
Creatinine, Ser: 0.81 mg/dL (ref 0.57–1.00)
GFR calc Af Amer: 92 mL/min/{1.73_m2} (ref >59)
GFR calc non Af Amer: 80 mL/min/{1.73_m2} (ref >59)
Globulin, Total: 2.9 g/dL (ref 1.5–4.5)
Glucose: 90 mg/dL (ref 65–99)
Potassium: 5.2 mmol/L (ref 3.5–5.2)
Sodium: 139 mmol/L (ref 134–144)
Total Protein: 7.5 g/dL (ref 6.0–8.5)

## 2021-02-15 LAB — LIPID PANEL WITH LDL/HDL RATIO
Cholesterol, Total: 140 mg/dL (ref 100–199)
HDL: 52 mg/dL (ref >39)
LDL Chol Calc (NIH): 63 mg/dL (ref 0–99)
LDL/HDL Ratio: 1.2 ratio (ref 0.0–3.2)
Triglycerides: 144 mg/dL (ref 0–149)
VLDL Cholesterol Cal: 25 mg/dL (ref 5–40)

## 2021-02-15 LAB — CBC WITH DIFFERENTIAL/PLATELET
Basophils Absolute: 0.1 10*3/uL (ref 0.0–0.2)
Basos: 1 %
EOS (ABSOLUTE): 0.2 10*3/uL (ref 0.0–0.4)
Eos: 2 %
Hematocrit: 50 % — ABNORMAL HIGH (ref 34.0–46.6)
Hemoglobin: 16.8 g/dL — ABNORMAL HIGH (ref 11.1–15.9)
Immature Grans (Abs): 0 10*3/uL (ref 0.0–0.1)
Immature Granulocytes: 0 %
Lymphocytes Absolute: 2.7 10*3/uL (ref 0.7–3.1)
Lymphs: 24 %
MCH: 30.5 pg (ref 26.6–33.0)
MCHC: 33.6 g/dL (ref 31.5–35.7)
MCV: 91 fL (ref 79–97)
Monocytes Absolute: 0.6 10*3/uL (ref 0.1–0.9)
Monocytes: 5 %
Neutrophils Absolute: 7.4 10*3/uL — ABNORMAL HIGH (ref 1.4–7.0)
Neutrophils: 68 %
Platelets: 443 10*3/uL (ref 150–450)
RBC: 5.5 x10E6/uL — ABNORMAL HIGH (ref 3.77–5.28)
RDW: 13.2 % (ref 11.7–15.4)
WBC: 10.9 10*3/uL — ABNORMAL HIGH (ref 3.4–10.8)

## 2021-02-15 LAB — VITAMIN D 25 HYDROXY (VIT D DEFICIENCY, FRACTURES): Vit D, 25-Hydroxy: 34.7 ng/mL (ref 30.0–100.0)

## 2021-02-15 LAB — INSULIN, RANDOM: INSULIN: 16.8 u[IU]/mL (ref 2.6–24.9)

## 2021-02-15 NOTE — Progress Notes (Signed)
Chief Complaint:   OBESITY Tracy Graves is here to discuss her progress with her obesity treatment plan along with follow-up of her obesity related diagnoses. Tracy Graves is on the Category 2 Plan and states she is following her eating plan approximately 50% of the time. Tracy Graves states she is walking 20 minutes 3 times per week.  Today's visit was #: 8 Starting weight: 249 lbs Starting date: 09/12/2020 Today's weight: 234 lbs Today's date: 02/14/2021 Total lbs lost to date: 15 lbs Total lbs lost since last in-office visit: 2 lbs  Interim History: Right before pt's last scheduled appointment, her best friend's mother died and this really disrupted pt's entire schedule. Pt voices that she has been trying to get back on her normal routine and schedule. Two days ago pt got herself back on track.  Subjective:   1. Vitamin D deficiency Pt denies nausea, vomiting, and muscle weakness but notes fatigue. Pt is on prescription Vit D. Her last Vit D level was 10.2 on 09/12/2020.  2. Type 2 diabetes mellitus with hyperglycemia, without long-term current use of insulin (HCC) Pt's last A1c was 5.6 with an insulin level of 41.6. She is on Glucophage.  3. Hyperlipidemia associated with type 2 diabetes mellitus (HCC) Pt's last lipid panel resulted LDL 82 (goal is <70). She is on Lipitor and denies transaminitis or myalgias.  4. Tobacco abuse Pt is still working on decreasing cigarette usage. Her last CBC resulted an elevated RBC and H/H.  Assessment/Plan:   1. Vitamin D deficiency Low Vitamin D level contributes to fatigue and are associated with obesity, breast, and colon cancer. She agrees to continue to take prescription Vitamin D @50 ,000 IU every week and will follow-up for routine testing of Vitamin D, at least 2-3 times per year to avoid over-replacement. Check labs today.  - Vitamin D, Ergocalciferol, (DRISDOL) 1.25 MG (50000 UNIT) CAPS capsule; Take 1 capsule (50,000 Units total) by mouth every 7  (seven) days.  Dispense: 4 capsule; Refill: 0  - VITAMIN D 25 Hydroxy (Vit-D Deficiency, Fractures)  2. Type 2 diabetes mellitus with hyperglycemia, without long-term current use of insulin (HCC) Good blood sugar control is important to decrease the likelihood of diabetic complications such as nephropathy, neuropathy, limb loss, blindness, coronary artery disease, and death. Intensive lifestyle modification including diet, exercise and weight loss are the first line of treatment for diabetes. Check labs today.  - Comprehensive metabolic panel - Hemoglobin A1c - Insulin, random  3. Hyperlipidemia associated with type 2 diabetes mellitus (HCC) Cardiovascular risk and specific lipid/LDL goals reviewed.  We discussed several lifestyle modifications today and Tracy Graves will continue to work on diet, exercise and weight loss efforts. Orders and follow up as documented in patient record. Check FLP today.  Counseling Intensive lifestyle modifications are the first line treatment for this issue. . Dietary changes: Increase soluble fiber. Decrease simple carbohydrates. . Exercise changes: Moderate to vigorous-intensity aerobic activity 150 minutes per week if tolerated. . Lipid-lowering medications: see documented in medical record.  - Lipid Panel With LDL/HDL Ratio  4. Tobacco abuse Check CBC today.  - CBC w/Diff/Platelet  5. Class 3 severe obesity with serious comorbidity and body mass index (BMI) of 40.0 to 44.9 in adult, unspecified obesity type [facility]) Tracy Graves is currently in the action stage of change. As such, her goal is to continue with weight loss efforts. She has agreed to the Category 2 Plan.   Exercise goals: All adults should avoid inactivity. Some physical activity is better  than none, and adults who participate in any amount of physical activity gain some health benefits.  Behavioral modification strategies: increasing lean protein intake, meal planning and cooking strategies, keeping  healthy foods in the home and planning for success.  Tracy Graves has agreed to follow-up with our clinic in 3 weeks. She was informed of the importance of frequent follow-up visits to maximize her success with intensive lifestyle modifications for her multiple health conditions.   Tracy Graves was informed we would discuss her lab results at her next visit unless there is a critical issue that needs to be addressed sooner. Tracy Graves agreed to keep her next visit at the agreed upon time to discuss these results.  Objective:   Blood pressure 117/79, pulse 90, temperature (!) 97.4 F (36.3 C), temperature source Oral, height 5\' 3"  (1.6 m), weight 234 lb (106.1 kg), SpO2 98 %. Body mass index is 41.45 kg/m.  General: Cooperative, alert, well developed, in no acute distress. HEENT: Conjunctivae and lids unremarkable. Cardiovascular: Regular rhythm.  Lungs: Normal work of breathing. Neurologic: No focal deficits.   Lab Results  Component Value Date   CREATININE 0.81 02/14/2021   BUN 13 02/14/2021   NA 139 02/14/2021   K 5.2 02/14/2021   CL 100 02/14/2021   CO2 23 02/14/2021   Lab Results  Component Value Date   ALT 17 02/14/2021   AST 16 02/14/2021   ALKPHOS 118 02/14/2021   BILITOT 0.4 02/14/2021   Lab Results  Component Value Date   HGBA1C 5.5 02/14/2021   HGBA1C 5.6 09/12/2020   HGBA1C 5.3 12/24/2016   HGBA1C 11.2 (H) 06/05/2015   Lab Results  Component Value Date   INSULIN 16.8 02/14/2021   INSULIN 41.6 (H) 09/12/2020   Lab Results  Component Value Date   TSH 1.160 09/12/2020   Lab Results  Component Value Date   CHOL 140 02/14/2021   HDL 52 02/14/2021   LDLCALC 63 02/14/2021   TRIG 144 02/14/2021   Lab Results  Component Value Date   WBC 10.9 (H) 02/14/2021   HGB 16.8 (H) 02/14/2021   HCT 50.0 (H) 02/14/2021   MCV 91 02/14/2021   PLT 443 02/14/2021    Attestation Statements:   Reviewed by clinician on day of visit: allergies, medications, problem list, medical  history, surgical history, family history, social history, and previous encounter notes.  Time spent on visit including pre-visit chart review and post-visit care and charting was 20 minutes.   02/16/2021, am acting as transcriptionist for Edmund Hilda, MD.  I have reviewed the above documentation for accuracy and completeness, and I agree with the above. - [name], MD

## 2021-03-06 ENCOUNTER — Ambulatory Visit (INDEPENDENT_AMBULATORY_CARE_PROVIDER_SITE_OTHER): Payer: Medicaid Other | Admitting: Family Medicine

## 2021-03-06 ENCOUNTER — Encounter (INDEPENDENT_AMBULATORY_CARE_PROVIDER_SITE_OTHER): Payer: Self-pay | Admitting: Family Medicine

## 2021-03-06 ENCOUNTER — Other Ambulatory Visit: Payer: Self-pay

## 2021-03-06 VITALS — BP 117/77 | HR 85 | Temp 97.8°F | Ht 63.0 in | Wt 232.0 lb

## 2021-03-06 DIAGNOSIS — E559 Vitamin D deficiency, unspecified: Secondary | ICD-10-CM | POA: Diagnosis not present

## 2021-03-06 DIAGNOSIS — Z6841 Body Mass Index (BMI) 40.0 and over, adult: Secondary | ICD-10-CM | POA: Diagnosis not present

## 2021-03-06 MED ORDER — VITAMIN D (ERGOCALCIFEROL) 1.25 MG (50000 UNIT) PO CAPS: 50000.0000 [IU] | ORAL_CAPSULE | ORAL | 0 refills | Status: DC

## 2021-03-07 NOTE — Progress Notes (Incomplete)
HEMATOLOGY-ONCOLOGY TELEPHONE VISIT PROGRESS NOTE  I connected with Tracy Graves on 03/07/2021 at  2:15 PM EST by telephone and verified that I am speaking with the correct person using two identifiers.  I discussed the limitations, risks, security and privacy concerns of performing an evaluation and management service by telephone and the availability of in person appointments.  I also discussed with the patient that there may be a patient responsible charge related to this service. The patient expressed understanding and agreed to proceed.   History of Present Illness: Tracy Graves is a 60 y.o. female with above-mentioned history of bilateral breast cancers treated with lumpectomies and re-excision, adjuvant radiation, and who is currently on tamoxifen therapy39m daily.She presents over the phone today to discuss her labs.   Oncology History  Malignant neoplasm of upper-outer quadrant of left breast in female, estrogen receptor positive ([facility]  08/28/2017 Initial Diagnosis   Palpable left breast mass with distortion at 1:30 position: 1.1 cm with left axillary lymph node, 3.5 mm cortex, biopsy tubular 09/05/2017. Biopsy of the breast mass revealed grade 1-2 IDC with DCIS ER 95%, PR 95%, Ki-67 20%, HER-2 negative ratio 1.32 T1c N0 stage IA   09/10/2017 Genetic Testing   Negative genetic testing on the 9-gene STAT panel.The STAT Breast cancer panel offered by [facility] includes sequencing and rearrangement analysis for the following 9 genes:  ATM, BRCA1, BRCA2, CDH1, CHEK2, PALB2, PTEN, STK11 and TP53.   The report date is 09/10/2017.  Negative genetic testing on the reflexed common hereditary cancer panel.  The Hereditary Gene Panel offered by [facility] includes sequencing and/or deletion duplication testing of the following 46 genes: APC, ATM, AXIN2, BARD1, BMPR1A, BRCA1, BRCA2, BRIP1, CDH1, CDKN2A (p14ARF), CDKN2A (p16INK4a), CHEK2, CTNNA1, DICER1, EPCAM (Deletion/duplication testing  only), GREM1 (promoter region deletion/duplication testing only), KIT, MEN1, MLH1, MSH2, MSH3, MSH6, MUTYH, NBN, NF1, NHTL1, PALB2, PDGFRA, PMS2, POLD1, POLE, PTEN, RAD50, RAD51C, RAD51D, SDHB, SDHC, SDHD, SMAD4, SMARCA4. STK11, TP53, TSC1, TSC2, and VHL.  The following genes were evaluated for sequence changes only: SDHA and HOXB13 c.251G>A variant only.  The report date is September 10, 2017.   10/04/2017 Surgery   Left lumpectomy: IDC with DCIS, 2.1 cm, 0/9 lymph nodes negative margins negative; ER 95%, PR 95%, HER-2 negative ratio 1.32, Ki-67 20%, T2N0 stage Ib; Right lumpectomy: IDC with DCIS 1.8 cm, focally involving lateral and anterior margins, 0/3 lymph nodes negative, ER 100%, PR 20%, HER-2 negative ratio 1.23, Ki-67 3%, T1CN0 stage I a   10/04/2017 Oncotype testing   Oncotype DX score 17 and 14: Risk of recurrence 11%/9% with tamoxifen alone   10/22/2017 Surgery   Reexcision lateral margin: Residual DCIS intermediate grade, [facility]exc[facility]: [facility]  11/28/2017 - 01/14/2018 Radiation Therapy   Adjuvant radiation therapy   01/2018 -  Anti-estrogen oral therapy   Tamoxifen daily   05/07/2018 Cancer Staging   Staging form: Breast, AJCC 8th Edition - Pathologic: Stage IA (pT2, pN0, cM0, G1, ER+, PR+, HER2-) - Signed by CGardenia Phlegm NP on 05/07/2018   Malignant neoplasm of lower-outer quadrant of right breast of female, estrogen receptor positive ([facility] [facility]  11/08/2017 Initial Diagnosis   Malignant neoplasm of lower-outer quadrant of right breast of female, estrogen receptor positive [place]   05/07/2018 Cancer Staging   Staging form: Breast, AJCC 8th Edition - Pathologic: Stage IA (pT1c, pN0, cM0, G1, ER+, PR+, HER2-) - Signed by CGardenia Phlegm NP on 05/07/2018     Observations/Objective:  Assessment Plan:  No problem-specific Assessment & Plan notes found for this encounter.    I discussed the assessment and treatment plan with the patient. The  patient was provided an opportunity to ask questions and all were answered. The patient agreed with the plan and demonstrated an understanding of the instructions. The patient was advised to call back or seek an in-person evaluation if the symptoms worsen or if the condition fails to improve as anticipated.   Total time spent: *** mins including non-face to face time and time spent for planning, charting and coordination of care  Rulon Eisenmenger, MD 03/07/2021    I, Cloyde Reams Dorshimer, am acting as scribe for Nicholas Lose, MD.  {Add scribe attestation statement}

## 2021-03-08 NOTE — Progress Notes (Signed)
Chief Complaint:   OBESITY Tracy Graves is here to discuss her progress with her obesity treatment plan along with follow-up of her obesity related diagnoses. Tracy Graves is on the Category 2 Plan and states she is following her eating plan approximately 100% of the time. Tracy Graves states she is walking, resistance, stairs 60 minutes 5-7 times per week.  Today's visit was #: 9 Starting weight: 249 lbs Starting date: 09/12/2020 Today's weight: 232 lbs Today's date: 03/06/2021 Total lbs lost to date: 17 lbs Total lbs lost since last in-office visit: 2 lbs  Interim History: Tracy Graves has really committed to category 2 over the last few weeks. She has no plans for the upcoming few weeks. Pt is interested in changing meal plan. She is getting more hungry between meals. She seems to have biggest urge to snack between lunch and dinner.  Subjective:   1. Vitamin D deficiency Pt denies nausea, vomiting, and muscle weakness but notes fatigue. Pt is on prescription Vit D. Her last Vit D level was 34.7.  2. Hypercalcemia Pt calcium level slightly elevated from previous check. She is not on meds that could be contributing to elevation.  Assessment/Plan:   1. Vitamin D deficiency Low Vitamin D level contributes to fatigue and are associated with obesity, breast, and colon cancer. She agrees to continue to take prescription Vitamin D @50 ,000 IU every week and will follow-up for routine testing of Vitamin D, at least 2-3 times per year to avoid over-replacement.  - Vitamin D, Ergocalciferol, (DRISDOL) 1.25 MG (50000 UNIT) CAPS capsule; Take 1 capsule (50,000 Units total) by mouth every 7 (seven) days.  Dispense: 4 capsule; Refill: 0  2. Hypercalcemia Recheck calcium level in 4 weeks.  3. Class 3 severe obesity with serious comorbidity and body mass index (BMI) of 40.0 to 44.9 in adult, unspecified obesity type (HCC) Tracy Graves is currently in the action stage of change. As such, her goal is to continue with weight loss  efforts. She has agreed to the Category 2 Plan with up to 8 oz at dinner and keeping a food journal and adhering to recommended goals of 200-300 calories and 20+ g protein with breakfast.   Exercise goals: As is  Behavioral modification strategies: increasing lean protein intake, meal planning and cooking strategies and keeping healthy foods in the home.  Tracy Graves has agreed to follow-up with our clinic in 2.5-3 weeks. She was informed of the importance of frequent follow-up visits to maximize her success with intensive lifestyle modifications for her multiple health conditions.   Objective:   Blood pressure 117/77, pulse 85, temperature 97.8 F (36.6 C), temperature source Oral, height 5\' 3"  (1.6 m), weight 232 lb (105.2 kg), SpO2 95 %. Body mass index is 41.1 kg/m.  General: Cooperative, alert, well developed, in no acute distress. HEENT: Conjunctivae and lids unremarkable. Cardiovascular: Regular rhythm.  Lungs: Normal work of breathing. Neurologic: No focal deficits.   Lab Results  Component Value Date   CREATININE 0.81 02/14/2021   BUN 13 02/14/2021   NA 139 02/14/2021   K 5.2 02/14/2021   CL 100 02/14/2021   CO2 23 02/14/2021   Lab Results  Component Value Date   ALT 17 02/14/2021   AST 16 02/14/2021   ALKPHOS 118 02/14/2021   BILITOT 0.4 02/14/2021   Lab Results  Component Value Date   HGBA1C 5.5 02/14/2021   HGBA1C 5.6 09/12/2020   HGBA1C 5.3 12/24/2016   HGBA1C 11.2 (H) 06/05/2015   Lab Results  Component  Value Date   INSULIN 16.8 02/14/2021   INSULIN 41.6 (H) 09/12/2020   Lab Results  Component Value Date   TSH 1.160 09/12/2020   Lab Results  Component Value Date   CHOL 140 02/14/2021   HDL 52 02/14/2021   LDLCALC 63 02/14/2021   TRIG 144 02/14/2021   Lab Results  Component Value Date   WBC 10.9 (H) 02/14/2021   HGB 16.8 (H) 02/14/2021   HCT 50.0 (H) 02/14/2021   MCV 91 02/14/2021   PLT 443 02/14/2021     Attestation Statements:    Reviewed by clinician on day of visit: allergies, medications, problem list, medical history, surgical history, family history, social history, and previous encounter notes.  Time spent on visit including pre-visit chart review and post-visit care and charting was 20 minutes.   Edmund Hilda, am acting as transcriptionist for [name], MD.   I have reviewed the above documentation for accuracy and completeness, and I agree with the above. - Katherina Mires, MD

## 2021-03-29 ENCOUNTER — Other Ambulatory Visit: Payer: Self-pay

## 2021-03-29 ENCOUNTER — Encounter (INDEPENDENT_AMBULATORY_CARE_PROVIDER_SITE_OTHER): Payer: Self-pay | Admitting: Family Medicine

## 2021-03-29 ENCOUNTER — Ambulatory Visit (INDEPENDENT_AMBULATORY_CARE_PROVIDER_SITE_OTHER): Payer: Medicaid Other | Admitting: Family Medicine

## 2021-03-29 VITALS — BP 125/82 | HR 76 | Temp 97.8°F | Ht 63.0 in | Wt 230.0 lb

## 2021-03-29 DIAGNOSIS — E1165 Type 2 diabetes mellitus with hyperglycemia: Secondary | ICD-10-CM | POA: Diagnosis not present

## 2021-03-29 DIAGNOSIS — E559 Vitamin D deficiency, unspecified: Secondary | ICD-10-CM

## 2021-03-29 DIAGNOSIS — Z6841 Body Mass Index (BMI) 40.0 and over, adult: Secondary | ICD-10-CM | POA: Diagnosis not present

## 2021-03-29 MED ORDER — VITAMIN D (ERGOCALCIFEROL) 1.25 MG (50000 UNIT) PO CAPS: 50000.0000 [IU] | ORAL_CAPSULE | ORAL | 0 refills | Status: DC

## 2021-03-30 LAB — COMPREHENSIVE METABOLIC PANEL
ALT: 16 IU/L (ref 0–32)
AST: 18 IU/L (ref 0–40)
Albumin/Globulin Ratio: 1.3 (ref 1.2–2.2)
Albumin: 4 g/dL (ref 3.8–4.9)
Alkaline Phosphatase: 123 IU/L — ABNORMAL HIGH (ref 44–121)
BUN/Creatinine Ratio: 16 (ref 9–23)
BUN: 13 mg/dL (ref 6–24)
Bilirubin Total: 0.3 mg/dL (ref 0.0–1.2)
CO2: 21 mmol/L (ref 20–29)
Calcium: 10.6 mg/dL — ABNORMAL HIGH (ref 8.7–10.2)
Chloride: 105 mmol/L (ref 96–106)
Creatinine, Ser: 0.8 mg/dL (ref 0.57–1.00)
Globulin, Total: 3.1 g/dL (ref 1.5–4.5)
Glucose: 89 mg/dL (ref 65–99)
Potassium: 5.3 mmol/L — ABNORMAL HIGH (ref 3.5–5.2)
Sodium: 143 mmol/L (ref 134–144)
Total Protein: 7.1 g/dL (ref 6.0–8.5)
eGFR: 85 mL/min/{1.73_m2} (ref >59)

## 2021-03-30 LAB — PARATHYROID HORMONE, INTACT (NO CA): PTH: 89 pg/mL — ABNORMAL HIGH (ref 15–65)

## 2021-04-05 NOTE — Progress Notes (Signed)
Chief Complaint:   OBESITY Tracy Graves is here to discuss her progress with her obesity treatment plan along with follow-up of her obesity related diagnoses. Tracy Graves is on the Category 2 Plan and states she is following her eating plan approximately ?% of the time. Tracy Graves states she is walking and resistance training 30-45 minutes 5 times per week.  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 She has been journaling food and writing down everything she eats. She use [employer] for information but writes everything down. She is starting to get hungry halfway between meals. Pt is planning on doing more outdoor yard work in the upcoming weeks.  Subjective:   1. Vitamin D deficiency Pt denies nausea, vomiting, and muscle weakness but notes fatigue. Pt is on prescription Vit D.  2. Type 2 diabetes mellitus with hyperglycemia, without long-term current use of insulin (HCC) Tracy Graves is on Metformin. Her last A1c was 5.5 and insulin level 16.8.   Lab Results  Component Value Date   HGBA1C 5.5 02/14/2021   HGBA1C 5.6 09/12/2020   HGBA1C 5.3 12/24/2016   Lab Results  Component Value Date   LDLCALC 63 02/14/2021   CREATININE 0.80 03/29/2021   Lab Results  Component Value Date   INSULIN 16.8 02/14/2021   INSULIN 41.6 (H) 09/12/2020    3. Hypercalcemia Tracy Graves had an elevated calcium level at last draw. She is not on calcium supplement or calcium sparing medications.   Assessment/Plan:   1. Vitamin D deficiency Low Vitamin D level contributes to fatigue and are associated with obesity, breast, and colon cancer. She agrees to continue to take prescription Vitamin D @50 ,000 IU every week and will follow-up for routine testing of Vitamin D, at least 2-3  times per year to avoid over-replacement.  - Vitamin D, Ergocalciferol, (DRISDOL) 1.25 MG (50000 UNIT) CAPS capsule; Take 1 capsule (50,000 Units total) by mouth every 7 (seven) days.  Dispense: 4 capsule; Refill: 0  2. Type 2 diabetes mellitus with hyperglycemia, without long-term current use of insulin (HCC) Good blood sugar control is important to decrease the likelihood of diabetic complications such as nephropathy, neuropathy, limb loss, blindness, coronary artery disease, and death. Intensive lifestyle modification including diet, exercise and weight loss are the first line of treatment for diabetes. Continue Metformin with no change in dose.  3. Hypercalcemia Check labs today.  - Comprehensive metabolic panel - Parathyroid hormone, intact (no Ca)  4. Class 3 severe obesity with serious comorbidity and body mass index (BMI) of 40.0 to 44.9 in adult, unspecified obesity type [facility]) Tracy Graves is currently in the action stage of change. As such, her goal is to continue with weight loss efforts. She has agreed to the Category 2 Plan + 100 calories.   Exercise goals: As is  Behavioral modification strategies: increasing lean protein intake, meal planning and cooking strategies, keeping healthy foods in the home and planning for success.  Tracy Graves has agreed to follow-up with our clinic in 3 weeks. She was informed of the importance of frequent follow-up visits to maximize her success with intensive lifestyle modifications for her multiple health conditions.   Tracy Graves was informed we would discuss her lab results at her next visit unless there is a critical issue that needs  to be addressed sooner. Tracy Graves agreed to keep her next visit at the agreed upon time to discuss these results.  Objective:   Blood pressure 125/82, pulse 76, temperature 97.8 F (36.6 C), height 5\' 3"  (1.6 m), weight 230 lb (104.3 kg), SpO2 98 %. Body mass index is 40.74 kg/m.  General: Cooperative, alert, well developed, in  no acute distress. HEENT: Conjunctivae and lids unremarkable. Cardiovascular: Regular rhythm.  Lungs: Normal work of breathing. Neurologic: No focal deficits.   Lab Results  Component Value Date   CREATININE 0.80 03/29/2021   BUN 13 03/29/2021   NA 143 03/29/2021   K 5.3 (H) 03/29/2021   CL 105 03/29/2021   CO2 21 03/29/2021   Lab Results  Component Value Date   ALT 16 03/29/2021   AST 18 03/29/2021   ALKPHOS 123 (H) 03/29/2021   BILITOT 0.3 03/29/2021   Lab Results  Component Value Date   HGBA1C 5.5 02/14/2021   HGBA1C 5.6 09/12/2020   HGBA1C 5.3 12/24/2016   HGBA1C 11.2 (H) 06/05/2015   Lab Results  Component Value Date   INSULIN 16.8 02/14/2021   INSULIN 41.6 (H) 09/12/2020   Lab Results  Component Value Date   TSH 1.160 09/12/2020   Lab Results  Component Value Date   CHOL 140 02/14/2021   HDL 52 02/14/2021   LDLCALC 63 02/14/2021   TRIG 144 02/14/2021   Lab Results  Component Value Date   WBC 10.9 (H) 02/14/2021   HGB 16.8 (H) 02/14/2021   HCT 50.0 (H) 02/14/2021   MCV 91 02/14/2021   PLT 443 02/14/2021    Attestation Statements:   Reviewed by clinician on day of visit: allergies, medications, problem list, medical history, surgical history, family history, social history, and previous encounter notes.  Time spent on visit including pre-visit chart review and post-visit care and charting was 20 minutes.   02/16/2021, am acting as transcriptionist for Edmund Hilda, MD.  I have reviewed the above documentation for accuracy and completeness, and I agree with the above. - [name], MD

## 2021-04-06 DIAGNOSIS — Z720 Tobacco use: Secondary | ICD-10-CM | POA: Diagnosis not present

## 2021-04-06 DIAGNOSIS — E785 Hyperlipidemia, unspecified: Secondary | ICD-10-CM | POA: Diagnosis not present

## 2021-04-06 DIAGNOSIS — D75839 Thrombocytosis, unspecified: Secondary | ICD-10-CM | POA: Diagnosis not present

## 2021-04-06 DIAGNOSIS — E119 Type 2 diabetes mellitus without complications: Secondary | ICD-10-CM | POA: Diagnosis not present

## 2021-04-06 DIAGNOSIS — I10 Essential (primary) hypertension: Secondary | ICD-10-CM | POA: Diagnosis not present

## 2021-04-06 DIAGNOSIS — E039 Hypothyroidism, unspecified: Secondary | ICD-10-CM | POA: Diagnosis not present

## 2021-04-19 ENCOUNTER — Ambulatory Visit (INDEPENDENT_AMBULATORY_CARE_PROVIDER_SITE_OTHER): Payer: Medicaid Other | Admitting: Family Medicine

## 2021-04-19 ENCOUNTER — Other Ambulatory Visit: Payer: Self-pay

## 2021-04-19 ENCOUNTER — Encounter (INDEPENDENT_AMBULATORY_CARE_PROVIDER_SITE_OTHER): Payer: Self-pay | Admitting: Family Medicine

## 2021-04-19 DIAGNOSIS — E559 Vitamin D deficiency, unspecified: Secondary | ICD-10-CM

## 2021-04-19 DIAGNOSIS — Z6841 Body Mass Index (BMI) 40.0 and over, adult: Secondary | ICD-10-CM

## 2021-04-19 MED ORDER — VITAMIN D (ERGOCALCIFEROL) 1.25 MG (50000 UNIT) PO CAPS: 50000.0000 [IU] | ORAL_CAPSULE | ORAL | 0 refills | Status: DC

## 2021-04-21 NOTE — Progress Notes (Signed)
Chief Complaint:   OBESITY Tracy Graves is here to discuss her progress with her obesity treatment plan along with follow-up of her obesity related diagnoses. Tracy Graves is on the Category 2 Plan + 100 calories and states she is following her eating plan approximately 80% of the time. Tracy Graves states she is walking and stairs 60 minutes 4 times per week.  Today's visit was #: 11 Starting weight: 249 lbs Starting date: 09/12/2020 Today's weight: 234 lbs Today's date: 04/19/2021 Total lbs lost to date: 15  Total lbs lost since last in-office visit: 0  Interim History: Tracy Graves has been just gardening, eating, same routine over the last few weeks. She had a good Easter. Last week, pt was helping her friend more ad has gotten back on meal plan since then.  Subjective:   1. Vitamin D deficiency Tracy Graves's last Vit D level was 34.7. She denies nausea, vomiting, and muscle weakness but notes fatigue. Pt is on prescription Vit D.  2. Hypercalcemia Tracy Graves's last calcium level had improved from 11 to 10.6. She is not on calcium sparing medication.  Assessment/Plan:   1. Vitamin D deficiency Low Vitamin D level contributes to fatigue and are associated with obesity, breast, and colon cancer. She agrees to continue to take prescription Vitamin D @50 ,000 IU every week and will follow-up for routine testing of Vitamin D, at least 2-3 times per year to avoid over-replacement.  - Vitamin D, Ergocalciferol, (DRISDOL) 1.25 MG (50000 UNIT) CAPS capsule; Take 1 capsule (50,000 Units total) by mouth every 7 (seven) days.  Dispense: 4 capsule; Refill: 0  2. Hypercalcemia Repeat labs in 3 months- June/July 2022- may need referral to endocrine.  3. Class 3 severe obesity with serious comorbidity and body mass index (BMI) of 40.0 to 44.9 in adult, unspecified obesity type (HCC) Tracy Graves is currently in the action stage of change. As such, her goal is to continue with weight loss efforts. She has agreed to the Category 2 Plan  + 100 calories.   Exercise goals: As is  Behavioral modification strategies: increasing lean protein intake, meal planning and cooking strategies and keeping healthy foods in the home.  Tracy Graves has agreed to follow-up with our clinic in 3 weeks. She was informed of the importance of frequent follow-up visits to maximize her success with intensive lifestyle modifications for her multiple health conditions.   Objective:   Blood pressure 127/84, pulse 80, temperature 97.8 F (36.6 C), height 5\' 3"  (1.6 m), weight 234 lb (106.1 kg), SpO2 96 %. Body mass index is 41.45 kg/m.  General: Cooperative, alert, well developed, in no acute distress. HEENT: Conjunctivae and lids unremarkable. Cardiovascular: Regular rhythm.  Lungs: Normal work of breathing. Neurologic: No focal deficits.   Lab Results  Component Value Date   CREATININE 0.80 03/29/2021   BUN 13 03/29/2021   NA 143 03/29/2021   K 5.3 (H) 03/29/2021   CL 105 03/29/2021   CO2 21 03/29/2021   Lab Results  Component Value Date   ALT 16 03/29/2021   AST 18 03/29/2021   ALKPHOS 123 (H) 03/29/2021   BILITOT 0.3 03/29/2021   Lab Results  Component Value Date   HGBA1C 5.5 02/14/2021   HGBA1C 5.6 09/12/2020   HGBA1C 5.3 12/24/2016   HGBA1C 11.2 (H) 06/05/2015   Lab Results  Component Value Date   INSULIN 16.8 02/14/2021   INSULIN 41.6 (H) 09/12/2020   Lab Results  Component Value Date   TSH 1.160 09/12/2020   Lab Results  Component Value Date   CHOL 140 02/14/2021   HDL 52 02/14/2021   LDLCALC 63 02/14/2021   TRIG 144 02/14/2021   Lab Results  Component Value Date   WBC 10.9 (H) 02/14/2021   HGB 16.8 (H) 02/14/2021   HCT 50.0 (H) 02/14/2021   MCV 91 02/14/2021   PLT 443 02/14/2021    Attestation Statements:   Reviewed by clinician on day of visit: allergies, medications, problem list, medical history, surgical history, family history, social history, and previous encounter notes.  Time spent on visit  including pre-visit chart review and post-visit care and charting was 18 minutes.   Edmund Hilda, am acting as transcriptionist for [name], MD.   I have reviewed the above documentation for accuracy and completeness, and I agree with the above. - Katherina Mires, MD

## 2021-05-30 ENCOUNTER — Other Ambulatory Visit (INDEPENDENT_AMBULATORY_CARE_PROVIDER_SITE_OTHER): Payer: Self-pay | Admitting: Family Medicine

## 2021-05-30 DIAGNOSIS — E559 Vitamin D deficiency, unspecified: Secondary | ICD-10-CM

## 2021-05-30 NOTE — Telephone Encounter (Signed)
DR Ukleja 

## 2021-05-31 ENCOUNTER — Encounter (INDEPENDENT_AMBULATORY_CARE_PROVIDER_SITE_OTHER): Payer: Self-pay | Admitting: Family Medicine

## 2021-06-01 ENCOUNTER — Ambulatory Visit (INDEPENDENT_AMBULATORY_CARE_PROVIDER_SITE_OTHER): Payer: Medicaid Other | Admitting: Family Medicine

## 2021-06-07 ENCOUNTER — Ambulatory Visit (INDEPENDENT_AMBULATORY_CARE_PROVIDER_SITE_OTHER): Payer: Medicaid Other | Admitting: Family Medicine

## 2021-06-20 ENCOUNTER — Ambulatory Visit (INDEPENDENT_AMBULATORY_CARE_PROVIDER_SITE_OTHER): Payer: Medicaid Other | Admitting: Family Medicine

## 2021-06-20 ENCOUNTER — Other Ambulatory Visit: Payer: Self-pay

## 2021-06-20 ENCOUNTER — Encounter (INDEPENDENT_AMBULATORY_CARE_PROVIDER_SITE_OTHER): Payer: Self-pay | Admitting: Family Medicine

## 2021-06-20 VITALS — BP 119/82 | HR 88 | Temp 98.5°F | Ht 63.0 in | Wt 234.0 lb

## 2021-06-20 DIAGNOSIS — E559 Vitamin D deficiency, unspecified: Secondary | ICD-10-CM | POA: Diagnosis not present

## 2021-06-20 DIAGNOSIS — Z6841 Body Mass Index (BMI) 40.0 and over, adult: Secondary | ICD-10-CM

## 2021-06-20 DIAGNOSIS — Z720 Tobacco use: Secondary | ICD-10-CM | POA: Diagnosis not present

## 2021-06-20 MED ORDER — VITAMIN D (ERGOCALCIFEROL) 1.25 MG (50000 UNIT) PO CAPS: 50000.0000 [IU] | ORAL_CAPSULE | ORAL | 0 refills | Status: DC

## 2021-06-21 NOTE — Progress Notes (Signed)
Chief Complaint:   OBESITY Kennya is here to discuss her progress with her obesity treatment plan along with follow-up of her obesity related diagnoses. Hibo is on the Category 2 Plan  + 100 cal and states she is following her eating plan approximately 50% of the time. Rohini states she is doing water aerobics and resistance training 60 minutes 2-3 times per week.  Today's visit was #: 12 Starting weight: 249 lbs Starting date: 09/12/2020 Today's weight: 234 lbs Today's date: 06/20/2021 Total lbs lost to date: 15 Total lbs lost since last in-office visit: 0  Interim History: Antonetta has tried to stay committed to meal plan but it's been a long time since her last appt. She has been committed to a good amount of activity since her last appt. She is eating whatever she can when she can. She made a date to quit smoking.  Subjective:   1. Vitamin D deficiency Kingston denies nausea, vomiting, and muscle weakness but notes fatigue. She is on prescription Vit D.  2. Tobacco abuse Cypress picked a quit date of August 30, 2021. She is currently still smoking.  Assessment/Plan:   1. Vitamin D deficiency Low Vitamin D level contributes to fatigue and are associated with obesity, breast, and colon cancer. She agrees to continue to take prescription Vitamin D @50 ,000 IU every week and will follow-up for routine testing of Vitamin D, at least 2-3 times per year to avoid over-replacement.  - Vitamin D, Ergocalciferol, (DRISDOL) 1.25 MG (50000 UNIT) CAPS capsule; Take 1 capsule (50,000 Units total) by mouth every 7 (seven) days.  Dispense: 4 capsule; Refill: 0  2. Tobacco abuse Follow up in August. Offer Chantix if needed.  3. Class 3 severe obesity with serious comorbidity and body mass index (BMI) of 40.0 to 44.9 in adult, unspecified obesity type (HCC)  Julina is currently in the action stage of change. As such, her goal is to continue with weight loss efforts. She has agreed to the Category 2  Plan + 100 calories.   Exercise goals:  As is  Behavioral modification strategies: increasing lean protein intake, meal planning and cooking strategies, keeping healthy foods in the home, and planning for success.  Jasiel has agreed to follow-up with our clinic in 3 weeks. She was informed of the importance of frequent follow-up visits to maximize her success with intensive lifestyle modifications for her multiple health conditions.   Objective:   Blood pressure 119/82, pulse 88, temperature 98.5 F (36.9 C), height 5\' 3"  (1.6 m), weight 234 lb (106.1 kg), SpO2 95 %. Body mass index is 41.45 kg/m.  General: Cooperative, alert, well developed, in no acute distress. HEENT: Conjunctivae and lids unremarkable. Cardiovascular: Regular rhythm.  Lungs: Normal work of breathing. Neurologic: No focal deficits.   Lab Results  Component Value Date   CREATININE 0.80 03/29/2021   BUN 13 03/29/2021   NA 143 03/29/2021   K 5.3 (H) 03/29/2021   CL 105 03/29/2021   CO2 21 03/29/2021   Lab Results  Component Value Date   ALT 16 03/29/2021   AST 18 03/29/2021   ALKPHOS 123 (H) 03/29/2021   BILITOT 0.3 03/29/2021   Lab Results  Component Value Date   HGBA1C 5.5 02/14/2021   HGBA1C 5.6 09/12/2020   HGBA1C 5.3 12/24/2016   HGBA1C 11.2 (H) 06/05/2015   Lab Results  Component Value Date   INSULIN 16.8 02/14/2021   INSULIN 41.6 (H) 09/12/2020   Lab Results  Component Value  Date   TSH 1.160 09/12/2020   Lab Results  Component Value Date   CHOL 140 02/14/2021   HDL 52 02/14/2021   LDLCALC 63 02/14/2021   TRIG 144 02/14/2021   Lab Results  Component Value Date   WBC 10.9 (H) 02/14/2021   HGB 16.8 (H) 02/14/2021   HCT 50.0 (H) 02/14/2021   MCV 91 02/14/2021   PLT 443 02/14/2021   No results found for: IRON, TIBC, FERRITIN  Attestation Statements:   Reviewed by clinician on day of visit: allergies, medications, problem list, medical history, surgical history, family history,  social history, and previous encounter notes.  Time spent on visit including pre-visit chart review and post-visit care and charting was 20 minutes.   Edmund Hilda, CMA, am acting as transcriptionist for [name], MD.  I have reviewed the above documentation for accuracy and completeness, and I agree with the above. - Katherina Mires, MD

## 2021-06-27 ENCOUNTER — Ambulatory Visit: Payer: Self-pay | Admitting: Internal Medicine

## 2021-07-11 ENCOUNTER — Other Ambulatory Visit: Payer: Self-pay

## 2021-07-11 ENCOUNTER — Ambulatory Visit (INDEPENDENT_AMBULATORY_CARE_PROVIDER_SITE_OTHER): Payer: Medicaid Other | Admitting: Family Medicine

## 2021-07-11 ENCOUNTER — Encounter (INDEPENDENT_AMBULATORY_CARE_PROVIDER_SITE_OTHER): Payer: Self-pay | Admitting: Family Medicine

## 2021-07-11 VITALS — BP 126/77 | HR 81 | Temp 98.4°F | Ht 63.0 in | Wt 236.0 lb

## 2021-07-11 DIAGNOSIS — E1165 Type 2 diabetes mellitus with hyperglycemia: Secondary | ICD-10-CM

## 2021-07-11 DIAGNOSIS — E559 Vitamin D deficiency, unspecified: Secondary | ICD-10-CM

## 2021-07-11 DIAGNOSIS — Z6841 Body Mass Index (BMI) 40.0 and over, adult: Secondary | ICD-10-CM

## 2021-07-11 DIAGNOSIS — Z9189 Other specified personal risk factors, not elsewhere classified: Secondary | ICD-10-CM

## 2021-07-11 MED ORDER — VITAMIN D (ERGOCALCIFEROL) 1.25 MG (50000 UNIT) PO CAPS: 50000.0000 [IU] | ORAL_CAPSULE | ORAL | 0 refills | Status: DC

## 2021-07-17 NOTE — Progress Notes (Signed)
Chief Complaint:   OBESITY Tracy Graves is here to discuss her progress with her obesity treatment plan along with follow-up of her obesity related diagnoses. Tracy Graves is on the Category 2 Plan + 100 calories and states she is following her eating plan approximately 45% of the time. Tracy Graves states she is not currently exercising.  Today's visit was #: 13 Starting weight: 249 lbs Starting date: 09/12/2020 Today's weight: 236 lbs Today's date: 07/11/2021 Total lbs lost to date: 13 Total lbs lost since last in-office visit: 0  Interim History: Tracy Graves has been helping out a friend who has surgery scheduled in a few days. She will be back in her house in a few days. She will be back in her house in a few days. She has been being cautious of fruit intake. She doesn't foresee obstacles with restarting plan.  Subjective:   1. Vitamin D deficiency Pt denies nausea, vomiting, and muscle weakness but notes fatigue. Pt is on prescription Vit D.  2. Type 2 diabetes mellitus with hyperglycemia, without long-term current use of insulin (HCC) Tracy Graves is on Metformin. Her last A1c was 5.5 and insulin level 16.8.  3. At risk for osteoporosis Tracy Graves is at higher risk of osteopenia and osteoporosis due to Vitamin D deficiency.   Assessment/Plan:   1. Vitamin D deficiency Low Vitamin D level contributes to fatigue and are associated with obesity, breast, and colon cancer. She agrees to continue to take prescription Vitamin D @50 ,000 IU every week and will follow-up for routine testing of Vitamin D, at least 2-3 times per year to avoid over-replacement.  Refill- Vitamin D, Ergocalciferol, (DRISDOL) 1.25 MG (50000 UNIT) CAPS capsule; Take 1 capsule (50,000 Units total) by mouth every 7 (seven) days.  Dispense: 4 capsule; Refill: 0  2. Type 2 diabetes mellitus with hyperglycemia, without long-term current use of insulin (HCC) Good blood sugar control is important to decrease the likelihood of diabetic complications  such as nephropathy, neuropathy, limb loss, blindness, coronary artery disease, and death. Intensive lifestyle modification including diet, exercise and weight loss are the first line of treatment for diabetes. Continue current treatment plan.  3. At risk for osteoporosis Tracy Graves was given approximately 15 minutes of osteoporosis prevention counseling today. Tracy Graves is at risk for osteopenia and osteoporosis due to her Vitamin D deficiency. She was encouraged to take her Vitamin D and follow her higher calcium diet and increase strengthening exercise to help strengthen her bones and decrease her risk of osteopenia and osteoporosis.  Repetitive spaced learning was employed today to elicit superior memory formation and behavioral change.  4. Obesity BMI 41.8  Tracy Graves is currently in the action stage of change. As such, her goal is to continue with weight loss efforts. She has agreed to the Category 2 Plan + 100 calories.   Exercise goals: No exercise has been prescribed at this time.  Behavioral modification strategies: increasing lean protein intake, meal planning and cooking strategies, keeping healthy foods in the home, and planning for success.  Tracy Graves has agreed to follow-up with our clinic in 2-3 weeks- fasting and repeat IC. She was informed of the importance of frequent follow-up visits to maximize her success with intensive lifestyle modifications for her multiple health conditions.   Objective:   Blood pressure 126/77, pulse 81, temperature 98.4 F (36.9 C), height 5\' 3"  (1.6 m), weight 236 lb (107 kg), SpO2 96 %. Body mass index is 41.81 kg/m.  General: Cooperative, alert, well developed, in no acute distress. HEENT:  Conjunctivae and lids unremarkable. Cardiovascular: Regular rhythm.  Lungs: Normal work of breathing. Neurologic: No focal deficits.   Lab Results  Component Value Date   CREATININE 0.80 03/29/2021   BUN 13 03/29/2021   NA 143 03/29/2021   K 5.3 (H) 03/29/2021   CL  105 03/29/2021   CO2 21 03/29/2021   Lab Results  Component Value Date   ALT 16 03/29/2021   AST 18 03/29/2021   ALKPHOS 123 (H) 03/29/2021   BILITOT 0.3 03/29/2021   Lab Results  Component Value Date   HGBA1C 5.5 02/14/2021   HGBA1C 5.6 09/12/2020   HGBA1C 5.3 12/24/2016   HGBA1C 11.2 (H) 06/05/2015   Lab Results  Component Value Date   INSULIN 16.8 02/14/2021   INSULIN 41.6 (H) 09/12/2020   Lab Results  Component Value Date   TSH 1.160 09/12/2020   Lab Results  Component Value Date   CHOL 140 02/14/2021   HDL 52 02/14/2021   LDLCALC 63 02/14/2021   TRIG 144 02/14/2021   Lab Results  Component Value Date   VD25OH 34.7 02/14/2021   VD25OH 10.2 (L) 09/12/2020   Lab Results  Component Value Date   WBC 10.9 (H) 02/14/2021   HGB 16.8 (H) 02/14/2021   HCT 50.0 (H) 02/14/2021   MCV 91 02/14/2021   PLT 443 02/14/2021    Attestation Statements:   Reviewed by clinician on day of visit: allergies, medications, problem list, medical history, surgical history, family history, social history, and previous encounter notes.  Edmund Hilda, CMA, am acting as transcriptionist for [name], MD.   I have reviewed the above documentation for accuracy and completeness, and I agree with the above. - [name], MD

## 2021-07-31 ENCOUNTER — Encounter (INDEPENDENT_AMBULATORY_CARE_PROVIDER_SITE_OTHER): Payer: Self-pay | Admitting: Family Medicine

## 2021-07-31 ENCOUNTER — Ambulatory Visit (INDEPENDENT_AMBULATORY_CARE_PROVIDER_SITE_OTHER): Payer: Medicaid Other | Admitting: Family Medicine

## 2021-07-31 ENCOUNTER — Other Ambulatory Visit: Payer: Self-pay

## 2021-07-31 VITALS — BP 124/74 | HR 83 | Temp 98.1°F | Ht 63.0 in | Wt 237.0 lb

## 2021-07-31 DIAGNOSIS — R0602 Shortness of breath: Secondary | ICD-10-CM

## 2021-07-31 DIAGNOSIS — Z6841 Body Mass Index (BMI) 40.0 and over, adult: Secondary | ICD-10-CM | POA: Diagnosis not present

## 2021-07-31 DIAGNOSIS — E039 Hypothyroidism, unspecified: Secondary | ICD-10-CM | POA: Diagnosis not present

## 2021-07-31 DIAGNOSIS — E119 Type 2 diabetes mellitus without complications: Secondary | ICD-10-CM | POA: Diagnosis not present

## 2021-07-31 DIAGNOSIS — E559 Vitamin D deficiency, unspecified: Secondary | ICD-10-CM

## 2021-07-31 DIAGNOSIS — E1165 Type 2 diabetes mellitus with hyperglycemia: Secondary | ICD-10-CM

## 2021-08-01 LAB — COMPREHENSIVE METABOLIC PANEL
ALT: 17 IU/L (ref 0–32)
AST: 16 IU/L (ref 0–40)
Albumin/Globulin Ratio: 1.6 (ref 1.2–2.2)
Albumin: 4.4 g/dL (ref 3.8–4.9)
Alkaline Phosphatase: 109 IU/L (ref 44–121)
BUN/Creatinine Ratio: 17 (ref 9–23)
BUN: 12 mg/dL (ref 6–24)
Bilirubin Total: 0.4 mg/dL (ref 0.0–1.2)
CO2: 21 mmol/L (ref 20–29)
Calcium: 10.8 mg/dL — ABNORMAL HIGH (ref 8.7–10.2)
Chloride: 105 mmol/L (ref 96–106)
Creatinine, Ser: 0.72 mg/dL (ref 0.57–1.00)
Globulin, Total: 2.7 g/dL (ref 1.5–4.5)
Glucose: 93 mg/dL (ref 65–99)
Potassium: 5.3 mmol/L — ABNORMAL HIGH (ref 3.5–5.2)
Sodium: 140 mmol/L (ref 134–144)
Total Protein: 7.1 g/dL (ref 6.0–8.5)
eGFR: 96 mL/min/{1.73_m2} (ref >59)

## 2021-08-01 LAB — INSULIN, RANDOM: INSULIN: 22.8 u[IU]/mL (ref 2.6–24.9)

## 2021-08-01 LAB — VITAMIN D 25 HYDROXY (VIT D DEFICIENCY, FRACTURES): Vit D, 25-Hydroxy: 37.7 ng/mL (ref 30.0–100.0)

## 2021-08-01 LAB — LIPID PANEL WITH LDL/HDL RATIO
Cholesterol, Total: 133 mg/dL (ref 100–199)
HDL: 60 mg/dL (ref >39)
LDL Chol Calc (NIH): 54 mg/dL (ref 0–99)
LDL/HDL Ratio: 0.9 ratio (ref 0.0–3.2)
Triglycerides: 104 mg/dL (ref 0–149)
VLDL Cholesterol Cal: 19 mg/dL (ref 5–40)

## 2021-08-01 LAB — HEMOGLOBIN A1C
Est. average glucose Bld gHb Est-mCnc: 114 mg/dL
Hgb A1c MFr Bld: 5.6 % (ref 4.8–5.6)

## 2021-08-01 LAB — TSH: TSH: 0.235 u[IU]/mL — ABNORMAL LOW (ref 0.450–4.500)
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
## 2021-08-04 DIAGNOSIS — K449 Diaphragmatic hernia without obstruction or gangrene: Secondary | ICD-10-CM | POA: Diagnosis not present

## 2021-08-12 ENCOUNTER — Telehealth: Payer: Medicaid Other | Admitting: Nurse Practitioner

## 2021-08-12 DIAGNOSIS — K047 Periapical abscess without sinus: Secondary | ICD-10-CM | POA: Diagnosis not present

## 2021-08-12 MED ORDER — CLINDAMYCIN HCL 300 MG PO CAPS: 300.0000 mg | ORAL_CAPSULE | Freq: Four times a day (QID) | ORAL | 0 refills | Status: DC

## 2021-08-12 NOTE — Progress Notes (Signed)

## 2021-08-13 NOTE — Progress Notes (Signed)
Cardiology Office Note:    Date:  08/14/2021   ID:  Tracy Graves, DOB July 03, 1961, MRN [id number]  PCP:  Patient, No Pcp Per (Inactive)  [facility] Cardiologist:  Tracy Constant, MD  [facility] Electrophysiologist:  None   CC:  Primary prevention f/u  History of Present Illness:    Tracy Graves is a 60 y.o. female with a hx of Morbid Obesity, DM with HTN, Aortic Atherosclerosis, Tobacco Abuse who presented for evaluation 09/21/20.  Received TTE without evidence of HCM (questionable prior echo) and NM Stress Test without evidence of ischemia. At last visit worked on primary prevention.  Seen 08/14/21. In interim of this visit, patient has continued exercise but has felt stagnant in weight loss.  Patient notes that she is doing great until getting a tooth abscess.  Patient notes she is doing well on a near vegan diet.  Has increased her exercise activity.  There are no interval hospital/ED visit.    No chest pain or pressure.  No SOB/DOE and no PND/Orthopnea.  No weight gain.  No palpitations or syncope.   Past Medical History:  Diagnosis Date   Abdominal hernia    Abscess    chest wall, right breast   Cellulitis    Diabetes (HCC)    History of kidney stones    Hyperglycemia due to type 2 diabetes mellitus (HCC) 06/05/2015   Hypertension    Hypothyroid    2016   Intertrigo    Morbid obesity due to excess calories (HCC) 06/05/2015   Multiple food allergies    Shortness of breath    Tobacco use     Past Surgical History:  Procedure Laterality Date   IR GENERIC HISTORICAL  12/23/2016   IR NEPHROSTOMY PLACEMENT LEFT 12/23/2016 Tracy Dance, MD [facility]   NEPHROLITHOTOMY Left 01/28/2017   Procedure: LEFT NEPHROLITHOTOMY PERCUTANEOUS;  Surgeon: Tracy Fat, MD;  Location: [facility];  Service: Urology;  Laterality: Left;   NEPHROSTOMY TUBE PLACEMENT ([facility] HX)  12/23/2016   Current Medications: Current Meds  Medication Sig   Albuterol Sulfate (PROVENTIL  HFA IN) as needed (sob, wheezing, allergies).    atorvastatin (LIPITOR) 10 MG tablet Take 1 tablet (10 mg total) by mouth daily.   clindamycin (CLEOCIN) 300 MG capsule Take 1 capsule (300 mg total) by mouth 4 (four) times daily.   levothyroxine (SYNTHROID) 125 MCG tablet Take 125 mcg by mouth daily before breakfast.    metFORMIN (GLUCOPHAGE) 500 MG tablet Take 500 mg by mouth daily with breakfast.    metoprolol succinate (TOPROL-XL) 50 MG 24 hr tablet Take 50 mg by mouth daily.   polyethylene glycol (MIRALAX / GLYCOLAX) 17 g packet Take 17 g by mouth as needed.   Vitamin D, Ergocalciferol, (DRISDOL) 1.25 MG (50000 UNIT) CAPS capsule Take 1 capsule (50,000 Units total) by mouth every 7 (seven) days.   Allergies:   Fish allergy and Other   Social History   Socioeconomic History   Marital status: Widowed    Spouse name: Not on file   Number of children: Not on file   Years of education: Not on file   Highest education level: Not on file  Occupational History   Occupation: Stay at Home  Tobacco Use   Smoking status: Some Days    Packs/day: 0.75    Years: 23.00    Pack years: 17.25    Types: Cigarettes    Last attempt to quit: 10/23/2019    Years since quitting:  1.8   Smokeless tobacco: Never   Tobacco comments:    pt stated smokes every now and then as of 06/09/20  Vaping Use   Vaping Use: Never used  Substance and Sexual Activity   Alcohol use: No   Drug use: No   Sexual activity: Yes    Birth control/protection: None  Other Topics Concern   Not on file  Social History Narrative   Not on file   Social Determinants of Health   Financial Resource Strain: Not on file  Food Insecurity: Not on file  Transportation Needs: Not on file  Physical Activity: Not on file  Stress: Not on file  Social Connections: Not on file     Family History: The patient's family history includes Breast cancer in her mother; Cancer in her mother; Depression in her father; Diabetes in her  father; Diabetes Mellitus II in her father; Heart attack in her father; Leukemia in her maternal grandfather; Obesity in her father and mother; Ovarian cancer in her maternal aunt; Thyroid disease in her father.  SCD in the father (age 28) in the setting of significant tobacco use; Uncle on fathers side had HF, died at 74.  ROS:   Please see the history of present illness.    All other systems reviewed and are negative.  EKGs/Labs/Other Studies Reviewed:    The following studies were reviewed today:  EKG:   08/14/21: SR rate 86 QTc 423 09/12/20 SR, 86, nonspecific TWI.  Transthoracic Echocardiogram: Date:10/04/20 Results: No evidence of HCM substrate on improved imaging. Normal systolic function 1. Left ventricular ejection fraction, by estimation, is 65 to 70%. The  left ventricle has normal function. The left ventricle has no regional  wall motion abnormalities. There is mild concentric left ventricular  hypertrophy. Left ventricular diastolic  parameters are consistent with Grade I diastolic dysfunction (impaired  relaxation).   2. Right ventricular systolic function is normal. The right ventricular  size is normal.   3. The mitral valve is normal in structure. Mild mitral valve  regurgitation. No evidence of mitral stenosis.   4. The aortic valve is normal in structure. Aortic valve regurgitation is  not visualized. No aortic stenosis is present.   5. The inferior vena cava is normal in size with greater than 50%  respiratory variability, suggesting right atrial pressure of 3 mmHg.   11/03/19: Notable Septal Hypertrophy septal measurement difficult but approximately 17 mm.  No significant LVOT gradient found.   NonCardiac CT: Date:11/03/2019 Results: LAD and LCx Calcium;no interstital lung disease  NM Stress Test 09/28/20 The left ventricular ejection fraction is hyperdynamic (>65%). Nuclear stress EF: 67%. No T wave inversion was noted during stress. There was no ST  segment deviation noted during stress. Defect 1: There is a small defect of mild severity. This is a low risk study.   Small size, mild intensity apical perfusion defect, improved with stress upright imaging, suggestive of attenuation artifact. No significant reversible ischemia. LVEF 67% with normal wall motion. This is a low risk study.   Recent Labs: 02/14/2021: Hemoglobin 16.8; Platelets 443 07/31/2021: ALT 17; BUN 12; Creatinine, Ser 0.72; Potassium 5.3; Sodium 140; TSH 0.235  Recent Lipid Panel    Component Value Date/Time   CHOL 133 07/31/2021 0853   TRIG 104 07/31/2021 0853   HDL 60 07/31/2021 0853   LDLCALC 54 07/31/2021 0853    Physical Exam:    VS:  BP 124/80   Pulse 86   Ht 5\' 3"  (1.6 m)  Wt 241 lb (109.3 kg)   SpO2 96%   BMI 42.69 kg/m     Wt Readings from Last 3 Encounters:  08/14/21 241 lb (109.3 kg)  07/31/21 237 lb (107.5 kg)  07/11/21 236 lb (107 kg)    GEN: Obese, well nourished, well developed in no acute distress HEENT: Normal NECK: No JVD LYMPHATICS: No lymphadenopathy CARDIAC: RRR, Subtle I/VI systolic crescendo with no rubs/gallops RESPIRATORY:  Clear to auscultation without rales, wheezing or rhonchi  ABDOMEN: Soft, non-tender, non-distended MUSCULOSKELETAL:  No edema; No deformity  SKIN: Warm and dry NEUROLOGIC:  Alert and oriented x 3 PSYCHIATRIC:  Normal affect   ASSESSMENT:    1. Aortic atherosclerosis (HCC)   2. Morbid obesity (HCC)   3. Tobacco abuse   4. Essential hypertension   5. Obesity, diabetes, and hypertension syndrome (HCC)   6. OSA (obstructive sleep apnea)     PLAN:    In order of problems listed above:  No barriers to dental abscess drainage in needed No cardiac barriers to antibiotics Patient has low RCRI and other risk factors and should be amenable to hernia surgery unless new changes occur  Aortic Atherosclerosis  Hyperlipidemia (mixed) -LDL goal less than 70 (07/31/21 was 54) -continue current statin -  discussed  near vegan diets, and exercise/weight loss interventions   Tobacco Abuse- Quit Date in 08/30/21  Obesity/DM/HTN OSA- seeing pulmonology - ambulatory blood pressure 120/80 at greatest - continue home medications  - OSA in the past, but improved with weight loss     One year follow up unless new symptoms or abnormal test results warranting change in plan    Medication Adjustments/Labs and Tests Ordered: Current medicines are reviewed at length with the patient today.  Concerns regarding medicines are outlined above.  No orders of the defined types were placed in this encounter.  No orders of the defined types were placed in this encounter.   Patient Instructions  Medication Instructions:  Your physician recommends that you continue on your current medications as directed. Please refer to the Current Medication list given to you today.  *If you need a refill on your cardiac medications before your next appointment, please call your pharmacy*   Lab Work: NONE If you have labs (blood work) drawn today and your tests are completely normal, you will receive your results only by: MyChart Message (if you have MyChart) OR A paper copy in the mail If you have any lab test that is abnormal or we need to change your treatment, we will call you to review the results.   Testing/Procedures: NONE   Follow-Up: At [facility], you and your health needs are our priority.  As part of our continuing mission to provide you with exceptional heart care, we have created designated Provider Care Teams.  These Care Teams include your primary Cardiologist (physician) and Advanced Practice Providers (APPs -  Physician Assistants and Nurse Practitioners) who all work together to provide you with the care you need, when you need it.     Your next appointment:   1 year(s)  The format for your next appointment:   In Person  Provider:   You may see Tracy Constant, MD or one of  the following Advanced Practice Providers on your designated Care Team:   Ronie Spies, PA-C Jacolyn Reedy, PA-C    Signed, Tracy Constant, MD  08/14/2021 9:02 AM    [facility]

## 2021-08-14 ENCOUNTER — Ambulatory Visit (INDEPENDENT_AMBULATORY_CARE_PROVIDER_SITE_OTHER): Payer: Medicaid Other | Admitting: Internal Medicine

## 2021-08-14 ENCOUNTER — Encounter: Payer: Self-pay | Admitting: Internal Medicine

## 2021-08-14 ENCOUNTER — Other Ambulatory Visit: Payer: Self-pay

## 2021-08-14 VITALS — BP 124/80 | HR 86 | Ht 63.0 in | Wt 241.0 lb

## 2021-08-14 DIAGNOSIS — I152 Hypertension secondary to endocrine disorders: Secondary | ICD-10-CM | POA: Diagnosis not present

## 2021-08-14 DIAGNOSIS — Z720 Tobacco use: Secondary | ICD-10-CM

## 2021-08-14 DIAGNOSIS — E1169 Type 2 diabetes mellitus with other specified complication: Secondary | ICD-10-CM | POA: Diagnosis not present

## 2021-08-14 DIAGNOSIS — E1159 Type 2 diabetes mellitus with other circulatory complications: Secondary | ICD-10-CM | POA: Diagnosis not present

## 2021-08-14 DIAGNOSIS — I10 Essential (primary) hypertension: Secondary | ICD-10-CM | POA: Diagnosis not present

## 2021-08-14 DIAGNOSIS — I700 Atherosclerosis of aorta: Secondary | ICD-10-CM

## 2021-08-14 DIAGNOSIS — E669 Obesity, unspecified: Secondary | ICD-10-CM | POA: Diagnosis not present

## 2021-08-14 DIAGNOSIS — G4733 Obstructive sleep apnea (adult) (pediatric): Secondary | ICD-10-CM | POA: Diagnosis not present

## 2021-08-14 NOTE — Patient Instructions (Signed)
Medication Instructions:  Your physician recommends that you continue on your current medications as directed. Please refer to the Current Medication list given to you today.  *If you need a refill on your cardiac medications before your next appointment, please call your pharmacy*   Lab Work: NONE If you have labs (blood work) drawn today and your tests are completely normal, you will receive your results only by: . MyChart Message (if you have MyChart) OR . A paper copy in the mail If you have any lab test that is abnormal or we need to change your treatment, we will call you to review the results.   Testing/Procedures: NONE   Follow-Up: At [facility], you and your health needs are our priority.  As part of our continuing mission to provide you with exceptional heart care, we have created designated Provider Care Teams.  These Care Teams include your primary Cardiologist (physician) and Advanced Practice Providers (APPs -  Physician Assistants and Nurse Practitioners) who all work together to provide you with the care you need, when you need it.    Your next appointment:   1 year(s)  The format for your next appointment:   In Person  Provider:   You may see Mahesh A Chandrasekhar, MD or one of the following Advanced Practice Providers on your designated Care Team:    Dayna Dunn, PA-C  Michele Lenze, PA-C       

## 2021-08-18 NOTE — Addendum Note (Signed)
Addended by: Winifred Olive on: 08/18/2021 10:37 AM   Modules accepted: Orders

## 2021-08-21 ENCOUNTER — Ambulatory Visit (INDEPENDENT_AMBULATORY_CARE_PROVIDER_SITE_OTHER): Payer: Medicaid Other | Admitting: Family Medicine

## 2021-08-21 ENCOUNTER — Other Ambulatory Visit: Payer: Self-pay

## 2021-08-21 ENCOUNTER — Encounter (INDEPENDENT_AMBULATORY_CARE_PROVIDER_SITE_OTHER): Payer: Self-pay | Admitting: Family Medicine

## 2021-08-21 VITALS — BP 136/79 | HR 78 | Temp 98.1°F | Ht 63.0 in | Wt 240.0 lb

## 2021-08-21 DIAGNOSIS — Z720 Tobacco use: Secondary | ICD-10-CM | POA: Diagnosis not present

## 2021-08-21 DIAGNOSIS — E559 Vitamin D deficiency, unspecified: Secondary | ICD-10-CM

## 2021-08-21 DIAGNOSIS — E785 Hyperlipidemia, unspecified: Secondary | ICD-10-CM | POA: Diagnosis not present

## 2021-08-21 DIAGNOSIS — Z6841 Body Mass Index (BMI) 40.0 and over, adult: Secondary | ICD-10-CM

## 2021-08-21 DIAGNOSIS — E1169 Type 2 diabetes mellitus with other specified complication: Secondary | ICD-10-CM | POA: Diagnosis not present

## 2021-08-21 MED ORDER — VITAMIN D (ERGOCALCIFEROL) 1.25 MG (50000 UNIT) PO CAPS: 50000.0000 [IU] | ORAL_CAPSULE | ORAL | 0 refills | Status: DC

## 2021-08-21 MED ORDER — ATORVASTATIN CALCIUM 10 MG PO TABS
10.0000 mg | ORAL_TABLET | Freq: Every day | ORAL | 0 refills | Status: DC
Start: 2021-08-21 — End: 2021-11-01

## 2021-08-21 NOTE — Progress Notes (Signed)
Chief Complaint:   OBESITY Tracy Graves is here to discuss her progress with her obesity treatment plan along with follow-up of her obesity related diagnoses. Tracy Graves is on the Category 3 Plan and states she is following her eating plan approximately 50% of the time. Tracy Graves states she is not currently exercising.  Today's visit was #: 15 Starting weight: 249 lbs Starting date: 09/12/2020 Today's weight: 240 lbs Today's date: 08/21/2021 Total lbs lost to date: 9 Total lbs lost since last in-office visit: 0  Interim History: Tracy Graves had a tooth abscess over the last few weeks. She voices that yesterday was the first day she ate anything solid in 10 days. Roommate/friend is no longer living with patient. She has no plans for the next few weeks except a few cookouts.  Subjective:   1. Vitamin D deficiency Tracy Graves denies nausea, vomiting, and muscle weakness but notes fatigue. Pt is on prescription Vit D. Her Vit D level is 37.7.  2. Hyperlipidemia associated with type 2 diabetes mellitus (HCC) She is on Lipitor and denies myalgias or transaminitis.  3. Tobacco abuse Tracy Graves's quit date is scheduled for August 30. She still wants to quit.  Assessment/Plan:   1. Vitamin D deficiency Low Vitamin D level contributes to fatigue and are associated with obesity, breast, and colon cancer. She agrees to continue to take prescription Vitamin D 50,000 IU every week and will follow-up for routine testing of Vitamin D, at least 2-3 times per year to avoid over-replacement.  Refill- Vitamin D, Ergocalciferol, (DRISDOL) 1.25 MG (50000 UNIT) CAPS capsule; Take 1 capsule (50,000 Units total) by mouth every 7 (seven) days.  Dispense: 4 capsule; Refill: 0  2. Hyperlipidemia associated with type 2 diabetes mellitus (HCC) Cardiovascular risk and specific lipid/LDL goals reviewed.  We discussed several lifestyle modifications today and Tracy Graves will continue to work on diet, exercise and weight loss efforts. Orders and  follow up as documented in patient record.   Counseling Intensive lifestyle modifications are the first line treatment for this issue. Dietary changes: Increase soluble fiber. Decrease simple carbohydrates. Exercise changes: Moderate to vigorous-intensity aerobic activity 150 minutes per week if tolerated. Lipid-lowering medications: see documented in medical record.  Refill- atorvastatin (LIPITOR) 10 MG tablet; Take 1 tablet (10 mg total) by mouth daily.  Dispense: 90 tablet; Refill: 0  3. Tobacco abuse Follow up at next appt.  4. Obesity with current BMI of 42.5  Tracy Graves is currently in the action stage of change. As such, her goal is to continue with weight loss efforts. She has agreed to the Category 3 Plan.   Exercise goals: All adults should avoid inactivity. Some physical activity is better than none, and adults who participate in any amount of physical activity gain some health benefits.  Behavioral modification strategies: increasing lean protein intake, meal planning and cooking strategies, keeping healthy foods in the home, and planning for success.  Tracy Graves has agreed to follow-up with our clinic in 3-4 weeks. She was informed of the importance of frequent follow-up visits to maximize her success with intensive lifestyle modifications for her multiple health conditions.   Objective:   Blood pressure 136/79, pulse 78, temperature 98.1 F (36.7 C), height 5\' 3"  (1.6 m), weight 240 lb (108.9 kg), SpO2 97 %. Body mass index is 42.51 kg/m.  General: Cooperative, alert, well developed, in no acute distress. HEENT: Conjunctivae and lids unremarkable. Cardiovascular: Regular rhythm.  Lungs: Normal work of breathing. Neurologic: No focal deficits.   Lab Results  Component Value Date   CREATININE 0.72 07/31/2021   BUN 12 07/31/2021   NA 140 07/31/2021   K 5.3 (H) 07/31/2021   CL 105 07/31/2021   CO2 21 07/31/2021   Lab Results  Component Value Date   ALT 17 07/31/2021    AST 16 07/31/2021   ALKPHOS 109 07/31/2021   BILITOT 0.4 07/31/2021   Lab Results  Component Value Date   HGBA1C 5.6 07/31/2021   HGBA1C 5.5 02/14/2021   HGBA1C 5.6 09/12/2020   HGBA1C 5.3 12/24/2016   HGBA1C 11.2 (H) 06/05/2015   Lab Results  Component Value Date   INSULIN 22.8 07/31/2021   INSULIN 16.8 02/14/2021   INSULIN 41.6 (H) 09/12/2020   Lab Results  Component Value Date   TSH 0.235 (L) 07/31/2021   Lab Results  Component Value Date   CHOL 133 07/31/2021   HDL 60 07/31/2021   LDLCALC 54 07/31/2021   TRIG 104 07/31/2021   Lab Results  Component Value Date   VD25OH 37.7 07/31/2021   VD25OH 34.7 02/14/2021   VD25OH 10.2 (L) 09/12/2020   Lab Results  Component Value Date   WBC 10.9 (H) 02/14/2021   HGB 16.8 (H) 02/14/2021   HCT 50.0 (H) 02/14/2021   MCV 91 02/14/2021   PLT 443 02/14/2021    Attestation Statements:   Reviewed by clinician on day of visit: allergies, medications, problem list, medical history, surgical history, family history, social history, and previous encounter notes.  Edmund Hilda, CMA, am acting as transcriptionist for [name], MD.   I have reviewed the above documentation for accuracy and completeness, and I agree with the above. - [name], MD

## 2021-09-18 ENCOUNTER — Encounter (INDEPENDENT_AMBULATORY_CARE_PROVIDER_SITE_OTHER): Payer: Self-pay | Admitting: Family Medicine

## 2021-09-18 ENCOUNTER — Other Ambulatory Visit: Payer: Self-pay

## 2021-09-18 ENCOUNTER — Ambulatory Visit (INDEPENDENT_AMBULATORY_CARE_PROVIDER_SITE_OTHER): Payer: Medicaid Other | Admitting: Family Medicine

## 2021-09-18 VITALS — BP 113/76 | HR 82 | Temp 98.0°F | Ht 63.0 in | Wt 234.0 lb

## 2021-09-18 DIAGNOSIS — Z720 Tobacco use: Secondary | ICD-10-CM | POA: Diagnosis not present

## 2021-09-18 DIAGNOSIS — E559 Vitamin D deficiency, unspecified: Secondary | ICD-10-CM

## 2021-09-18 DIAGNOSIS — Z6841 Body Mass Index (BMI) 40.0 and over, adult: Secondary | ICD-10-CM | POA: Diagnosis not present

## 2021-09-18 MED ORDER — VITAMIN D (ERGOCALCIFEROL) 1.25 MG (50000 UNIT) PO CAPS: 50000.0000 [IU] | ORAL_CAPSULE | ORAL | 0 refills | Status: DC

## 2021-09-19 NOTE — Progress Notes (Signed)
Chief Complaint:   OBESITY Tracy Graves is here to discuss her progress with her obesity treatment plan along with follow-up of her obesity related diagnoses. Tracy Graves is on the Category 3 Plan and states she is following her eating plan approximately 90% of the time. Tracy Graves states she is walking 1/2 to 1 mile 3-4 times a week and exercises 60 minutes 4-5 times per week.  Today's visit was #: 16 Starting weight: 249 lbs Starting date: 09/12/2020 Today's weight: 234 lbs Today's date: 09/18/2021 Total lbs lost to date: 15 Total lbs lost since last in-office visit: 6  Interim History: Tracy Graves has really gotten back on meal plan. Quit smoking for 3 days and had a massive panic attack and started smoking again. She is smoking about 1 cigarette every 2-3 days. She denies hunger or cravings since being diagnosed with diabetes. Pt is also going swimming 2 times a week at the [facility].  Subjective:   1. Vitamin D deficiency Tracy Graves denies nausea, vomiting, and muscle weakness but notes fatigue. Pt is on prescription Vit D.  2. Tobacco abuse She set a goal to quit on October 5. She is only smoking 1 cigarette every 2-3 days. She has previously tried Chantix.  Assessment/Plan:   1. Vitamin D deficiency Low Vitamin D level contributes to fatigue and are associated with obesity, breast, and colon cancer. She agrees to continue to take prescription Vitamin D 50,000 IU every week and will follow-up for routine testing of Vitamin D, at least 2-3 times per year to avoid over-replacement.  Refill- Vitamin D, Ergocalciferol, (DRISDOL) 1.25 MG (50000 UNIT) CAPS capsule; Take 1 capsule (50,000 Units total) by mouth every 7 (seven) days.  Dispense: 4 capsule; Refill: 0  2. Tobacco abuse Follow up at next appt.  3. Obesity with current BMI of 41.5  Tracy Graves is currently in the action stage of change. As such, her goal is to continue with weight loss efforts. She has agreed to the Category 3 Plan.   Exercise goals:  As  is  Behavioral modification strategies: increasing lean protein intake, meal planning and cooking strategies, keeping healthy foods in the home, and planning for success.  Tracy Graves has agreed to follow-up with our clinic in 3 weeks. She was informed of the importance of frequent follow-up visits to maximize her success with intensive lifestyle modifications for her multiple health conditions.   Objective:   Blood pressure 113/76, pulse 82, temperature 98 F (36.7 C), height 5\' 3"  (1.6 m), weight 234 lb (106.1 kg), SpO2 97 %. Body mass index is 41.45 kg/m.  General: Cooperative, alert, well developed, in no acute distress. HEENT: Conjunctivae and lids unremarkable. Cardiovascular: Regular rhythm.  Lungs: Normal work of breathing. Neurologic: No focal deficits.   Lab Results  Component Value Date   CREATININE 0.72 07/31/2021   BUN 12 07/31/2021   NA 140 07/31/2021   K 5.3 (H) 07/31/2021   CL 105 07/31/2021   CO2 21 07/31/2021   Lab Results  Component Value Date   ALT 17 07/31/2021   AST 16 07/31/2021   ALKPHOS 109 07/31/2021   BILITOT 0.4 07/31/2021   Lab Results  Component Value Date   HGBA1C 5.6 07/31/2021   HGBA1C 5.5 02/14/2021   HGBA1C 5.6 09/12/2020   HGBA1C 5.3 12/24/2016   HGBA1C 11.2 (H) 06/05/2015   Lab Results  Component Value Date   INSULIN 22.8 07/31/2021   INSULIN 16.8 02/14/2021   INSULIN 41.6 (H) 09/12/2020   Lab Results  Component Value Date   TSH 0.235 (L) 07/31/2021   Lab Results  Component Value Date   CHOL 133 07/31/2021   HDL 60 07/31/2021   LDLCALC 54 07/31/2021   TRIG 104 07/31/2021   Lab Results  Component Value Date   VD25OH 37.7 07/31/2021   VD25OH 34.7 02/14/2021   VD25OH 10.2 (L) 09/12/2020   Lab Results  Component Value Date   WBC 10.9 (H) 02/14/2021   HGB 16.8 (H) 02/14/2021   HCT 50.0 (H) 02/14/2021   MCV 91 02/14/2021   PLT 443 02/14/2021    Attestation Statements:   Reviewed by clinician on day of visit:  allergies, medications, problem list, medical history, surgical history, family history, social history, and previous encounter notes.  Edmund Hilda, CMA, am acting as transcriptionist for [name], MD.  I have reviewed the above documentation for accuracy and completeness, and I agree with the above. - [name], MD

## 2021-10-05 ENCOUNTER — Ambulatory Visit (INDEPENDENT_AMBULATORY_CARE_PROVIDER_SITE_OTHER): Payer: Medicaid Other | Admitting: Family Medicine

## 2021-10-05 ENCOUNTER — Encounter (INDEPENDENT_AMBULATORY_CARE_PROVIDER_SITE_OTHER): Payer: Self-pay

## 2021-10-09 DIAGNOSIS — E119 Type 2 diabetes mellitus without complications: Secondary | ICD-10-CM | POA: Diagnosis not present

## 2021-10-09 DIAGNOSIS — E785 Hyperlipidemia, unspecified: Secondary | ICD-10-CM | POA: Diagnosis not present

## 2021-10-09 DIAGNOSIS — I10 Essential (primary) hypertension: Secondary | ICD-10-CM | POA: Diagnosis not present

## 2021-10-09 DIAGNOSIS — E039 Hypothyroidism, unspecified: Secondary | ICD-10-CM | POA: Diagnosis not present

## 2021-10-09 DIAGNOSIS — D72829 Elevated white blood cell count, unspecified: Secondary | ICD-10-CM | POA: Diagnosis not present

## 2021-10-09 DIAGNOSIS — Z720 Tobacco use: Secondary | ICD-10-CM | POA: Diagnosis not present

## 2021-10-09 DIAGNOSIS — N390 Urinary tract infection, site not specified: Secondary | ICD-10-CM | POA: Diagnosis not present

## 2021-10-09 LAB — CBC: RBC: 5.48 — AB (ref 3.87–5.11)

## 2021-10-09 LAB — CBC AND DIFFERENTIAL
HCT: 50 — AB (ref 36–46)
Hemoglobin: 16.9 — AB (ref 12.0–16.0)
Platelets: 497 — AB (ref 150–399)
WBC: 12.4

## 2021-10-09 LAB — BASIC METABOLIC PANEL: Glucose: 101

## 2021-10-17 ENCOUNTER — Other Ambulatory Visit (INDEPENDENT_AMBULATORY_CARE_PROVIDER_SITE_OTHER): Payer: Self-pay | Admitting: Family Medicine

## 2021-10-17 DIAGNOSIS — E559 Vitamin D deficiency, unspecified: Secondary | ICD-10-CM

## 2021-10-17 MED ORDER — VITAMIN D (ERGOCALCIFEROL) 1.25 MG (50000 UNIT) PO CAPS: 50000.0000 [IU] | ORAL_CAPSULE | ORAL | 0 refills | Status: DC

## 2021-10-17 NOTE — Telephone Encounter (Signed)
LAST APPOINTMENT DATE: 09/18/21 NEXT APPOINTMENT DATE: 11/01/21   [id number] - [place] - [address] [place] [address] Phone: [phone] Fax: [phone]  [facility] [address], [place] - [address] [place] [address] Phone: [phone] Fax: [phone]  Patient is requesting a refill of the following medications: Pending Prescriptions:                       Disp   Refills   Vitamin D, Ergocalciferol, (DRISDOL) 1.25 *4 caps*0       Sig: Take 1 capsule (50,000 Units total) by mouth every 7          (seven) days.   Date last filled: 09/21/21 Previously prescribed by Dr.Ukleja  Lab Results      Component                Value               Date                      HGBA1C                   5.6                 07/31/2021                HGBA1C                   5.5                 02/14/2021                HGBA1C                   5.6                 09/12/2020           Lab Results      Component                Value               Date                      LDLCALC                  54                  07/31/2021                CREATININE               0.72                07/31/2021           Lab Results      Component                Value               Date                      VD25OH                   37.7  07/31/2021                VD25OH                   34.7                02/14/2021                VD25OH                   10.2 (L)            09/12/2020            BP Readings from Last 3 Encounters: 09/18/21 : 113/76 08/21/21 : 136/79 08/14/21 : 124/80

## 2021-10-18 ENCOUNTER — Ambulatory Visit (INDEPENDENT_AMBULATORY_CARE_PROVIDER_SITE_OTHER): Payer: Medicaid Other | Admitting: Family Medicine

## 2021-11-01 ENCOUNTER — Other Ambulatory Visit: Payer: Self-pay

## 2021-11-01 ENCOUNTER — Encounter (INDEPENDENT_AMBULATORY_CARE_PROVIDER_SITE_OTHER): Payer: Self-pay | Admitting: Family Medicine

## 2021-11-01 ENCOUNTER — Ambulatory Visit (INDEPENDENT_AMBULATORY_CARE_PROVIDER_SITE_OTHER): Payer: Medicaid Other | Admitting: Family Medicine

## 2021-11-01 VITALS — BP 114/79 | HR 78 | Temp 98.1°F | Ht 63.0 in | Wt 238.0 lb

## 2021-11-01 DIAGNOSIS — Z6841 Body Mass Index (BMI) 40.0 and over, adult: Secondary | ICD-10-CM

## 2021-11-01 DIAGNOSIS — E559 Vitamin D deficiency, unspecified: Secondary | ICD-10-CM

## 2021-11-01 DIAGNOSIS — E7849 Other hyperlipidemia: Secondary | ICD-10-CM

## 2021-11-01 MED ORDER — ATORVASTATIN CALCIUM 10 MG PO TABS: 10.0000 mg | ORAL_TABLET | Freq: Every day | ORAL | 0 refills | Status: DC

## 2021-11-01 MED ORDER — VITAMIN D (ERGOCALCIFEROL) 1.25 MG (50000 UNIT) PO CAPS: 50000.0000 [IU] | ORAL_CAPSULE | ORAL | 0 refills | Status: DC

## 2021-11-02 NOTE — Progress Notes (Signed)
Chief Complaint:   OBESITY Tracy Graves is here to discuss her progress with her obesity treatment plan along with follow-up of her obesity related diagnoses. Tracy Graves is on the Category 3 Plan and states she is following her eating plan approximately 50% of the time. Tracy Graves states she is doing arm exercises 20 minutes 7 times per week.  Today's visit was #: 17 Starting weight: 249 lbs Starting date: 09/12/2020 Today's weight: 238 lbs Today's date: 11/02/2021 Total lbs lost to date: 11 Total lbs lost since last in-office visit: 0  Interim History: Tracy Graves's last time in the clinic was 09/18/2021 (provider had to cancel). Pt has been dealing with ankle issues since her last appt. She is just now getting back on track meal plan wise. She is wearing an ankle brace when going out. Pt has no plans for the next few weeks. She is still ironing out plans for Thanksgiving.  Subjective:   1. Vitamin D deficiency Pt denies nausea, vomiting, and muscle weakness but notes fatigue.   2. Other hyperlipidemia Tracy Graves's last LDL was within normal limits. She denies transaminitis or myalgias.  Assessment/Plan:   1. Vitamin D deficiency Low Vitamin D level contributes to fatigue and are associated with obesity, breast, and colon cancer. She agrees to continue to take prescription Vitamin D 50,000 IU every week and will follow-up for routine testing of Vitamin D, at least 2-3 times per year to avoid over-replacement.  Refill- Vitamin D, Ergocalciferol, (DRISDOL) 1.25 MG (50000 UNIT) CAPS capsule; Take 1 capsule (50,000 Units total) by mouth every 7 (seven) days.  Dispense: 4 capsule; Refill: 0  2. Other hyperlipidemia Cardiovascular risk and specific lipid/LDL goals reviewed.  We discussed several lifestyle modifications today and Tracy Graves will continue to work on diet, exercise and weight loss efforts. Orders and follow up as documented in patient record.   Counseling Intensive lifestyle modifications are the  first line treatment for this issue. Dietary changes: Increase soluble fiber. Decrease simple carbohydrates. Exercise changes: Moderate to vigorous-intensity aerobic activity 150 minutes per week if tolerated. Lipid-lowering medications: see documented in medical record.  Refill- atorvastatin (LIPITOR) 10 MG tablet; Take 1 tablet (10 mg total) by mouth daily.  Dispense: 90 tablet; Refill: 0  3. Obesity with current BMI of 42.2  Tracy Graves is currently in the action stage of change. As such, her goal is to continue with weight loss efforts. She has agreed to the Category 3 Plan.   Exercise goals: All adults should avoid inactivity. Some physical activity is better than none, and adults who participate in any amount of physical activity gain some health benefits.  Behavioral modification strategies: increasing lean protein intake and meal planning and cooking strategies.  Tracy Graves has agreed to follow-up with our clinic in 3 weeks. She was informed of the importance of frequent follow-up visits to maximize her success with intensive lifestyle modifications for her multiple health conditions.   Objective:   Blood pressure 114/79, pulse 78, temperature 98.1 F (36.7 C), height 5\' 3"  (1.6 m), weight 238 lb (108 kg), SpO2 98 %. Body mass index is 42.16 kg/m.  General: Cooperative, alert, well developed, in no acute distress. HEENT: Conjunctivae and lids unremarkable. Cardiovascular: Regular rhythm.  Lungs: Normal work of breathing. Neurologic: No focal deficits.   Lab Results  Component Value Date   CREATININE 0.72 07/31/2021   BUN 12 07/31/2021   NA 140 07/31/2021   K 5.3 (H) 07/31/2021   CL 105 07/31/2021   CO2 21 07/31/2021  Lab Results  Component Value Date   ALT 17 07/31/2021   AST 16 07/31/2021   ALKPHOS 109 07/31/2021   BILITOT 0.4 07/31/2021   Lab Results  Component Value Date   HGBA1C 5.6 07/31/2021   HGBA1C 5.5 02/14/2021   HGBA1C 5.6 09/12/2020   HGBA1C 5.3 12/24/2016    HGBA1C 11.2 (H) 06/05/2015   Lab Results  Component Value Date   INSULIN 22.8 07/31/2021   INSULIN 16.8 02/14/2021   INSULIN 41.6 (H) 09/12/2020   Lab Results  Component Value Date   TSH 0.235 (L) 07/31/2021   Lab Results  Component Value Date   CHOL 133 07/31/2021   HDL 60 07/31/2021   LDLCALC 54 07/31/2021   TRIG 104 07/31/2021   Lab Results  Component Value Date   VD25OH 37.7 07/31/2021   VD25OH 34.7 02/14/2021   VD25OH 10.2 (L) 09/12/2020   Lab Results  Component Value Date   WBC 10.9 (H) 02/14/2021   HGB 16.8 (H) 02/14/2021   HCT 50.0 (H) 02/14/2021   MCV 91 02/14/2021   PLT 443 02/14/2021    Attestation Statements:   Reviewed by clinician on day of visit: allergies, medications, problem list, medical history, surgical history, family history, social history, and previous encounter notes.  Edmund Hilda, CMA, am acting as transcriptionist for [name], MD.   I have reviewed the above documentation for accuracy and completeness, and I agree with the above. - [name], MD

## 2021-11-15 ENCOUNTER — Encounter (INDEPENDENT_AMBULATORY_CARE_PROVIDER_SITE_OTHER): Payer: Self-pay | Admitting: Family Medicine

## 2021-11-15 ENCOUNTER — Ambulatory Visit (INDEPENDENT_AMBULATORY_CARE_PROVIDER_SITE_OTHER): Payer: Medicaid Other | Admitting: Family Medicine

## 2021-11-15 ENCOUNTER — Other Ambulatory Visit: Payer: Self-pay

## 2021-11-15 VITALS — BP 128/77 | HR 77 | Temp 97.9°F | Ht 63.0 in | Wt 237.0 lb

## 2021-11-15 DIAGNOSIS — Z6841 Body Mass Index (BMI) 40.0 and over, adult: Secondary | ICD-10-CM

## 2021-11-15 DIAGNOSIS — E038 Other specified hypothyroidism: Secondary | ICD-10-CM

## 2021-11-15 DIAGNOSIS — E559 Vitamin D deficiency, unspecified: Secondary | ICD-10-CM

## 2021-11-16 NOTE — Progress Notes (Signed)
Chief Complaint:   OBESITY Tracy Graves is here to discuss her progress with her obesity treatment plan along with follow-up of her obesity related diagnoses. Tracy Graves is on the Category 3 Plan and states she is following her eating plan approximately 98% of the time. Tracy Graves states she is walking and resistance training 45-60 minutes 5-7 times per week.  Today's visit was #: 18 Starting weight: 249 lbs Starting date: 09/12/2020 Today's weight: 237 lbs Today's date: 11/15/2021 Total lbs lost to date: 12 Total lbs lost since last in-office visit: 1  Interim History: Over the last few weeks, Tracy Graves has been mostly at home. She hasn't gotten a chance to go grocery shopping prior to today. She is going to her friends for Thanksgiving- wants to have a video game competition. Pt is getting almost all food in for category 3. She is doing cheese for snacks or seed cup. She is doing 6-7 oz protein at dinner. Tequita has a birthday in mid December and then Christmas next month. Pt has started taking food with her more and more.  Subjective:   1. Other specified hypothyroidism Tracy Graves's last TSH was 0.235. her medication was changed from 125 mcg to 112 mcg levothyroxine. Pt denies cold or hot intolerances or palpitations.  2. Vitamin D deficiency Pt denies nausea, vomiting, and muscle weakness but notes fatigue. Vit D prescription was sent at last appt but [facility] did not fill it. She took her last prescription pill 2 days ago.  Assessment/Plan:   1. Other specified hypothyroidism Patient with long-standing hypothyroidism, on levothyroxine therapy. She appears euthyroid. Orders and follow up as documented in patient record. Follow up with PCP for further management.  Counseling Good thyroid control is important for overall health. Supratherapeutic thyroid levels are dangerous and will not improve weight loss results. The correct way to take levothyroxine is fasting, with water, separated by at least 30  minutes from breakfast, and separated by more than 4 hours from calcium, iron, multivitamins, acid reflux medications (PPIs).   2. Vitamin D deficiency Low Vitamin D level contributes to fatigue and are associated with obesity, breast, and colon cancer. We will follow-up with pt concerning whether or not she needs a refill sent in.   3. Class 3 severe obesity with serious comorbidity and body mass index (BMI) of 40.0 to 44.9 in adult, unspecified obesity type (HCC)  Tracy Graves is currently in the action stage of change. As such, her goal is to continue with weight loss efforts. She has agreed to the Category 3 Plan.   Exercise goals: All adults should avoid inactivity. Some physical activity is better than none, and adults who participate in any amount of physical activity gain some health benefits.  Behavioral modification strategies: increasing lean protein intake, meal planning and cooking strategies, keeping healthy foods in the home, and planning for success.  Tracy Graves has agreed to follow-up with our clinic in 3-4 weeks. She was informed of the importance of frequent follow-up visits to maximize her success with intensive lifestyle modifications for her multiple health conditions.   Objective:   Blood pressure 128/77, pulse 77, temperature 97.9 F (36.6 C), height 5\' 3"  (1.6 m), weight 237 lb (107.5 kg), SpO2 100 %. Body mass index is 41.98 kg/m.  General: Cooperative, alert, well developed, in no acute distress. HEENT: Conjunctivae and lids unremarkable. Cardiovascular: Regular rhythm.  Lungs: Normal work of breathing. Neurologic: No focal deficits.   Lab Results  Component Value Date   CREATININE 0.72 07/31/2021  BUN 12 07/31/2021   NA 140 07/31/2021   K 5.3 (H) 07/31/2021   CL 105 07/31/2021   CO2 21 07/31/2021   Lab Results  Component Value Date   ALT 17 07/31/2021   AST 16 07/31/2021   ALKPHOS 109 07/31/2021   BILITOT 0.4 07/31/2021   Lab Results  Component Value Date    HGBA1C 5.6 07/31/2021   HGBA1C 5.5 02/14/2021   HGBA1C 5.6 09/12/2020   HGBA1C 5.3 12/24/2016   HGBA1C 11.2 (H) 06/05/2015   Lab Results  Component Value Date   INSULIN 22.8 07/31/2021   INSULIN 16.8 02/14/2021   INSULIN 41.6 (H) 09/12/2020   Lab Results  Component Value Date   TSH 0.235 (L) 07/31/2021   Lab Results  Component Value Date   CHOL 133 07/31/2021   HDL 60 07/31/2021   LDLCALC 54 07/31/2021   TRIG 104 07/31/2021   Lab Results  Component Value Date   VD25OH 37.7 07/31/2021   VD25OH 34.7 02/14/2021   VD25OH 10.2 (L) 09/12/2020   Lab Results  Component Value Date   WBC 10.9 (H) 02/14/2021   HGB 16.8 (H) 02/14/2021   HCT 50.0 (H) 02/14/2021   MCV 91 02/14/2021   PLT 443 02/14/2021    Attestation Statements:   Reviewed by clinician on day of visit: allergies, medications, problem list, medical history, surgical history, family history, social history, and previous encounter notes.  Edmund Hilda, CMA, am acting as transcriptionist for [name], MD.   I have reviewed the above documentation for accuracy and completeness, and I agree with the above. - [name], MD

## 2021-11-20 DIAGNOSIS — E039 Hypothyroidism, unspecified: Secondary | ICD-10-CM | POA: Diagnosis not present

## 2021-11-20 DIAGNOSIS — Z23 Encounter for immunization: Secondary | ICD-10-CM | POA: Diagnosis not present

## 2021-11-20 LAB — TSH: TSH: 1.28 (ref 0.41–5.90)

## 2021-12-11 ENCOUNTER — Ambulatory Visit (INDEPENDENT_AMBULATORY_CARE_PROVIDER_SITE_OTHER): Payer: Medicaid Other | Admitting: Family Medicine

## 2021-12-11 ENCOUNTER — Other Ambulatory Visit: Payer: Self-pay

## 2021-12-11 ENCOUNTER — Encounter (INDEPENDENT_AMBULATORY_CARE_PROVIDER_SITE_OTHER): Payer: Self-pay | Admitting: Family Medicine

## 2021-12-11 VITALS — BP 119/75 | HR 82 | Temp 98.2°F | Ht 63.0 in | Wt 240.0 lb

## 2021-12-11 DIAGNOSIS — E038 Other specified hypothyroidism: Secondary | ICD-10-CM | POA: Diagnosis not present

## 2021-12-11 DIAGNOSIS — Z720 Tobacco use: Secondary | ICD-10-CM | POA: Diagnosis not present

## 2021-12-11 DIAGNOSIS — Z6841 Body Mass Index (BMI) 40.0 and over, adult: Secondary | ICD-10-CM

## 2021-12-11 DIAGNOSIS — E1165 Type 2 diabetes mellitus with hyperglycemia: Secondary | ICD-10-CM | POA: Diagnosis not present

## 2021-12-12 NOTE — Progress Notes (Signed)
Chief Complaint:   OBESITY Tracy Graves is here to discuss her progress with her obesity treatment plan along with follow-up of her obesity related diagnoses. Tracy Graves is on the Category 3 Plan and states she is following her eating plan approximately 50% of the time. Tracy Graves states she is not currently exercising.  Today's visit was #: 19 Starting weight: 249 lbs Starting date: 09/12/2020 Today's weight: 240 lbs Today's date: 12/11/2021 Total lbs lost to date: 9 Total lbs lost since last in-office visit: 0  Interim History: Tracy Graves had a stressful Thanksgiving- unfortunately splashed grease on her hand twice. The next few weeks, pt is just prepping for Christmas. She hasn't been as compliant during the last 3 weeks. Pt has been stressed and recognizes she's not been as compliant on plan. She wants to switch meal plans at next appt.  Subjective:   1. Other specified hypothyroidism Tracy Graves's last TSH was 0.235. She had labs tested through her PCP.  2. Type 2 diabetes mellitus with hyperglycemia, without long-term current use of insulin (HCC) Labs done by [facility].  3. Tobacco abuse Pt wants to quit smoking in 2023. Previous quit dates were clouded by arisen stress.  Assessment/Plan:   1. Other specified hypothyroidism Patient with long-standing hypothyroidism, on levothyroxine therapy. She appears euthyroid. Orders and follow up as documented in patient record. Follow up lab for TSH.  Counseling Good thyroid control is important for overall health. Supratherapeutic thyroid levels are dangerous and will not improve weight loss results. The correct way to take levothyroxine is fasting, with water, separated by at least 30 minutes from breakfast, and separated by more than 4 hours from calcium, iron, multivitamins, acid reflux medications (PPIs).   2. Type 2 diabetes mellitus with hyperglycemia, without long-term current use of insulin (HCC) Good blood sugar control is important to  decrease the likelihood of diabetic complications such as nephropathy, neuropathy, limb loss, blindness, coronary artery disease, and death. Intensive lifestyle modification including diet, exercise and weight loss are the first line of treatment for diabetes. Follow up on labs.  3. Tobacco abuse Follow up on plan at next appt.  4. Obesity with current BMI of 42.6  Tracy Graves is currently in the action stage of change. As such, her goal is to continue with weight loss efforts. She has agreed to the Category 3 Plan.   Exercise goals: All adults should avoid inactivity. Some physical activity is better than none, and adults who participate in any amount of physical activity gain some health benefits.  Behavioral modification strategies: increasing lean protein intake, meal planning and cooking strategies, keeping healthy foods in the home, and planning for success.  Tracy Graves has agreed to follow-up with our clinic in 4 weeks. She was informed of the importance of frequent follow-up visits to maximize her success with intensive lifestyle modifications for her multiple health conditions.   Objective:   Blood pressure 119/75, pulse 82, temperature 98.2 F (36.8 C), height 5\' 3"  (1.6 m), weight 240 lb (108.9 kg), SpO2 96 %. Body mass index is 42.51 kg/m.  General: Cooperative, alert, well developed, in no acute distress. HEENT: Conjunctivae and lids unremarkable. Cardiovascular: Regular rhythm.  Lungs: Normal work of breathing. Neurologic: No focal deficits.   Lab Results  Component Value Date   CREATININE 0.72 07/31/2021   BUN 12 07/31/2021   NA 140 07/31/2021   K 5.3 (H) 07/31/2021   CL 105 07/31/2021   CO2 21 07/31/2021   Lab Results  Component Value Date  ALT 17 07/31/2021   AST 16 07/31/2021   ALKPHOS 109 07/31/2021   BILITOT 0.4 07/31/2021   Lab Results  Component Value Date   HGBA1C 5.6 07/31/2021   HGBA1C 5.5 02/14/2021   HGBA1C 5.6 09/12/2020   HGBA1C 5.3 12/24/2016    HGBA1C 11.2 (H) 06/05/2015   Lab Results  Component Value Date   INSULIN 22.8 07/31/2021   INSULIN 16.8 02/14/2021   INSULIN 41.6 (H) 09/12/2020   Lab Results  Component Value Date   TSH 0.235 (L) 07/31/2021   Lab Results  Component Value Date   CHOL 133 07/31/2021   HDL 60 07/31/2021   LDLCALC 54 07/31/2021   TRIG 104 07/31/2021   Lab Results  Component Value Date   VD25OH 37.7 07/31/2021   VD25OH 34.7 02/14/2021   VD25OH 10.2 (L) 09/12/2020   Lab Results  Component Value Date   WBC 10.9 (H) 02/14/2021   HGB 16.8 (H) 02/14/2021   HCT 50.0 (H) 02/14/2021   MCV 91 02/14/2021   PLT 443 02/14/2021    Attestation Statements:   Reviewed by clinician on day of visit: allergies, medications, problem list, medical history, surgical history, family history, social history, and previous encounter notes.  Edmund Hilda, CMA, am acting as transcriptionist for [name], MD.   I have reviewed the above documentation for accuracy and completeness, and I agree with the above. - [name], MD

## 2021-12-14 ENCOUNTER — Other Ambulatory Visit (INDEPENDENT_AMBULATORY_CARE_PROVIDER_SITE_OTHER): Payer: Self-pay | Admitting: Family Medicine

## 2021-12-14 ENCOUNTER — Encounter (INDEPENDENT_AMBULATORY_CARE_PROVIDER_SITE_OTHER): Payer: Self-pay

## 2021-12-14 DIAGNOSIS — E559 Vitamin D deficiency, unspecified: Secondary | ICD-10-CM

## 2022-01-07 ENCOUNTER — Encounter (INDEPENDENT_AMBULATORY_CARE_PROVIDER_SITE_OTHER): Payer: Self-pay

## 2022-01-08 ENCOUNTER — Other Ambulatory Visit: Payer: Self-pay

## 2022-01-08 ENCOUNTER — Encounter (INDEPENDENT_AMBULATORY_CARE_PROVIDER_SITE_OTHER): Payer: Self-pay | Admitting: Family Medicine

## 2022-01-08 ENCOUNTER — Ambulatory Visit (INDEPENDENT_AMBULATORY_CARE_PROVIDER_SITE_OTHER): Payer: Medicaid Other | Admitting: Family Medicine

## 2022-01-08 VITALS — BP 123/79 | HR 83 | Temp 97.8°F | Ht 63.0 in | Wt 242.0 lb

## 2022-01-08 DIAGNOSIS — Z720 Tobacco use: Secondary | ICD-10-CM

## 2022-01-08 DIAGNOSIS — E559 Vitamin D deficiency, unspecified: Secondary | ICD-10-CM

## 2022-01-08 DIAGNOSIS — Z6841 Body Mass Index (BMI) 40.0 and over, adult: Secondary | ICD-10-CM

## 2022-01-08 MED ORDER — VITAMIN D (ERGOCALCIFEROL) 1.25 MG (50000 UNIT) PO CAPS: 50000.0000 [IU] | ORAL_CAPSULE | ORAL | 0 refills | Status: DC

## 2022-01-09 NOTE — Progress Notes (Signed)
Chief Complaint:   OBESITY Tracy Graves is here to discuss her progress with her obesity treatment plan along with follow-up of her obesity related diagnoses. Tracy Graves is on the Category 3 Plan and states she is following her eating plan approximately 80% of the time. Tracy Graves states she is walking, resistance training, and dancing 45 minutes 4-6 times per week.  Today's visit was #: 20 Starting weight: 249 lbs Starting date: 09/12/2020 Today's weight: 242 lbs Today's date: 01/08/2022 Total lbs lost to date: 7 Total lbs lost since last in-office visit: 0  Interim History: Pt did well over the holidays- ate 80-85% on plan. She has been more on plan with food than exercise. She celebrated in [place] with her daughter at an art exhibit. Pt is only getting 1 snack per day. She is getting about 90 grams protein per day.  Subjective:   1. Vitamin D deficiency Pt denies nausea, vomiting, and muscle weakness but notes fatigue.   2. Tobacco abuse She smokes ~2-3 cigarettes a day. Pt has done quitting attempts in the past.  Assessment/Plan:   1. Vitamin D deficiency Low Vitamin D level contributes to fatigue and are associated with obesity, breast, and colon cancer. She agrees to continue to take prescription Vitamin D 50,000 IU every week and will follow-up for routine testing of Vitamin D, at least 2-3 times per year to avoid over-replacement.  Refill- Vitamin D, Ergocalciferol, (DRISDOL) 1.25 MG (50000 UNIT) CAPS capsule; Take 1 capsule (50,000 Units total) by mouth every 7 (seven) days.  Dispense: 4 capsule; Refill: 0  2. Tobacco abuse Follow up with pt's plan at next appt. Pt is to start increasing awareness as drive to smoke. Goal is to stop by the end of this year.  3. Obesity with current BMI of 43.0  Tracy Graves is currently in the action stage of change. As such, her goal is to continue with weight loss efforts. She has agreed to keeping a food journal and adhering to recommended goals of  1450-1600 calories and 95+ grams protein.   Exercise goals: All adults should avoid inactivity. Some physical activity is better than none, and adults who participate in any amount of physical activity gain some health benefits.  Behavioral modification strategies: increasing lean protein intake, meal planning and cooking strategies, keeping healthy foods in the home, and planning for success.  [name] has agreed to follow-up with our clinic in 3 weeks. She was informed of the importance of frequent follow-up visits to maximize her success with intensive lifestyle modifications for her multiple health conditions.   Objective:   Blood pressure 123/79, pulse 83, temperature 97.8 F (36.6 C), height 5\' 3"  (1.6 m), weight 242 lb (109.8 kg), SpO2 97 %. Body mass index is 42.87 kg/m.  General: Cooperative, alert, well developed, in no acute distress. HEENT: Conjunctivae and lids unremarkable. Cardiovascular: Regular rhythm.  Lungs: Normal work of breathing. Neurologic: No focal deficits.   Lab Results  Component Value Date   CREATININE 0.72 07/31/2021   BUN 12 07/31/2021   NA 140 07/31/2021   K 5.3 (H) 07/31/2021   CL 105 07/31/2021   CO2 21 07/31/2021   Lab Results  Component Value Date   ALT 17 07/31/2021   AST 16 07/31/2021   ALKPHOS 109 07/31/2021   BILITOT 0.4 07/31/2021   Lab Results  Component Value Date   HGBA1C 5.6 07/31/2021   HGBA1C 5.5 02/14/2021   HGBA1C 5.6 09/12/2020   HGBA1C 5.3 12/24/2016   HGBA1C  11.2 (H) 06/05/2015   Lab Results  Component Value Date   INSULIN 22.8 07/31/2021   INSULIN 16.8 02/14/2021   INSULIN 41.6 (H) 09/12/2020   Lab Results  Component Value Date   TSH 1.28 11/20/2021   Lab Results  Component Value Date   CHOL 133 07/31/2021   HDL 60 07/31/2021   LDLCALC 54 07/31/2021   TRIG 104 07/31/2021   Lab Results  Component Value Date   VD25OH 37.7 07/31/2021   VD25OH 34.7 02/14/2021   VD25OH 10.2 (L) 09/12/2020   Lab Results   Component Value Date   WBC 12.4 10/09/2021   HGB 16.9 (A) 10/09/2021   HCT 50 (A) 10/09/2021   MCV 91 02/14/2021   PLT 497 (A) 10/09/2021    Attestation Statements:   Reviewed by clinician on day of visit: allergies, medications, problem list, medical history, surgical history, family history, social history, and previous encounter notes.  Edmund Hilda, CMA, am acting as transcriptionist for [name], MD.   I have reviewed the above documentation for accuracy and completeness, and I agree with the above. - [name], MD

## 2022-01-29 ENCOUNTER — Encounter (INDEPENDENT_AMBULATORY_CARE_PROVIDER_SITE_OTHER): Payer: Self-pay | Admitting: Family Medicine

## 2022-01-29 ENCOUNTER — Telehealth (INDEPENDENT_AMBULATORY_CARE_PROVIDER_SITE_OTHER): Payer: Medicaid Other | Admitting: Family Medicine

## 2022-01-29 DIAGNOSIS — E7849 Other hyperlipidemia: Secondary | ICD-10-CM

## 2022-01-29 DIAGNOSIS — E559 Vitamin D deficiency, unspecified: Secondary | ICD-10-CM | POA: Diagnosis not present

## 2022-01-29 DIAGNOSIS — E669 Obesity, unspecified: Secondary | ICD-10-CM | POA: Diagnosis not present

## 2022-01-29 DIAGNOSIS — Z6841 Body Mass Index (BMI) 40.0 and over, adult: Secondary | ICD-10-CM | POA: Diagnosis not present

## 2022-01-29 MED ORDER — VITAMIN D (ERGOCALCIFEROL) 1.25 MG (50000 UNIT) PO CAPS: 50000.0000 [IU] | ORAL_CAPSULE | ORAL | 0 refills | Status: DC

## 2022-01-29 MED ORDER — ATORVASTATIN CALCIUM 10 MG PO TABS: 10.0000 mg | ORAL_TABLET | Freq: Every day | ORAL | 0 refills | Status: DC

## 2022-01-30 NOTE — Progress Notes (Signed)
TeleHealth Visit:  Due to the COVID-19 pandemic, this visit was completed with telemedicine (audio/video) technology to reduce patient and provider exposure as well as to preserve personal protective equipment.   Tracy Graves has verbally consented to this TeleHealth visit. The patient is located at home, the provider is located at the [facility] office. The participants in this visit include the listed provider and patient. The visit was conducted today via video.   Chief Complaint: OBESITY Tracy Graves is here to discuss her progress with her obesity treatment plan along with follow-up of her obesity related diagnoses. Tracy Graves is on keeping a food journal and adhering to recommended goals of 1450-1600 calories and 95 grams protein and states she is following her eating plan approximately 100% of the time. Tracy Graves states she is walking and resistance training 60-90 minutes 5 times per week.  Today's visit was #: 21 Starting weight: 249 lbs Starting date: 09/12/2020  Interim History: Pt is very stuffy with URI sxs. She has a nonproductive cough for 3 days. Prior to illness, pt was following journaling 100% with no issues noted. She reports a weight of 239 lbs (down 3 lbs). Her house mate has the flu and everyone at home is sick.  Subjective:   1. Vitamin D deficiency Pt denies nausea, vomiting, and muscle weakness but notes fatigue.   2. Other hyperlipidemia Pt is on Lipitor with no myalgias.  Assessment/Plan:   1. Vitamin D deficiency Low Vitamin D level contributes to fatigue and are associated with obesity, breast, and colon cancer. She agrees to continue to take prescription Vitamin D 50,000 IU every week and will follow-up for routine testing of Vitamin D, at least 2-3 times per year to avoid over-replacement.  Refill- Vitamin D, Ergocalciferol, (DRISDOL) 1.25 MG (50000 UNIT) CAPS capsule; Take 1 capsule (50,000 Units total) by mouth every 7 (seven) days.  Dispense: 4 capsule;  Refill: 0  2. Other hyperlipidemia Cardiovascular risk and specific lipid/LDL goals reviewed.  We discussed several lifestyle modifications today and Tracy Graves will continue to work on diet, exercise and weight loss efforts. Orders and follow up as documented in patient record.   Counseling Intensive lifestyle modifications are the first line treatment for this issue. Dietary changes: Increase soluble fiber. Decrease simple carbohydrates. Exercise changes: Moderate to vigorous-intensity aerobic activity 150 minutes per week if tolerated. Lipid-lowering medications: see documented in medical record.  Refill- atorvastatin (LIPITOR) 10 MG tablet; Take 1 tablet (10 mg total) by mouth daily.  Dispense: 90 tablet; Refill: 0  3. Obesity with current BMI of 43.0 Tracy Graves is currently in the action stage of change. As such, her goal is to continue with weight loss efforts. She has agreed to the Category 4 Plan.   Exercise goals: No exercise has been prescribed at this time.  Behavioral modification strategies: increasing lean protein intake, meal planning and cooking strategies, keeping healthy foods in the home, and keeping a strict food journal.  Tracy Graves has agreed to follow-up with our clinic in 3-4 weeks. She was informed of the importance of frequent follow-up visits to maximize her success with intensive lifestyle modifications for her multiple health conditions.  Objective:   VITALS: Per patient if applicable, see vitals. GENERAL: Alert and in no acute distress. CARDIOPULMONARY: No increased WOB. Speaking in clear sentences.  PSYCH: Pleasant and cooperative. Speech normal rate and rhythm. Affect is appropriate. Insight and judgement are appropriate. Attention is focused, linear, and appropriate.  NEURO: Oriented as arrived to appointment on time  with no prompting.   Lab Results  Component Value Date   CREATININE 0.72 07/31/2021   BUN 12 07/31/2021   NA 140 07/31/2021   K 5.3 (H) 07/31/2021    CL 105 07/31/2021   CO2 21 07/31/2021   Lab Results  Component Value Date   ALT 17 07/31/2021   AST 16 07/31/2021   ALKPHOS 109 07/31/2021   BILITOT 0.4 07/31/2021   Lab Results  Component Value Date   HGBA1C 5.6 07/31/2021   HGBA1C 5.5 02/14/2021   HGBA1C 5.6 09/12/2020   HGBA1C 5.3 12/24/2016   HGBA1C 11.2 (H) 06/05/2015   Lab Results  Component Value Date   INSULIN 22.8 07/31/2021   INSULIN 16.8 02/14/2021   INSULIN 41.6 (H) 09/12/2020   Lab Results  Component Value Date   TSH 1.28 11/20/2021   Lab Results  Component Value Date   CHOL 133 07/31/2021   HDL 60 07/31/2021   LDLCALC 54 07/31/2021   TRIG 104 07/31/2021   Lab Results  Component Value Date   VD25OH 37.7 07/31/2021   VD25OH 34.7 02/14/2021   VD25OH 10.2 (L) 09/12/2020   Lab Results  Component Value Date   WBC 12.4 10/09/2021   HGB 16.9 (A) 10/09/2021   HCT 50 (A) 10/09/2021   MCV 91 02/14/2021   PLT 497 (A) 10/09/2021   No results found for: IRON, TIBC, FERRITIN  Attestation Statements:   Reviewed by clinician on day of visit: allergies, medications, problem list, medical history, surgical history, family history, social history, and previous encounter notes.  Edmund Hilda, CMA, am acting as transcriptionist for [name], MD.   I have reviewed the above documentation for accuracy and completeness, and I agree with the above. - [name], MD

## 2022-02-01 ENCOUNTER — Other Ambulatory Visit (INDEPENDENT_AMBULATORY_CARE_PROVIDER_SITE_OTHER): Payer: Self-pay | Admitting: Family Medicine

## 2022-02-01 DIAGNOSIS — E559 Vitamin D deficiency, unspecified: Secondary | ICD-10-CM

## 2022-02-19 ENCOUNTER — Encounter (INDEPENDENT_AMBULATORY_CARE_PROVIDER_SITE_OTHER): Payer: Self-pay | Admitting: Family Medicine

## 2022-02-19 ENCOUNTER — Ambulatory Visit (INDEPENDENT_AMBULATORY_CARE_PROVIDER_SITE_OTHER): Payer: Medicaid Other | Admitting: Family Medicine

## 2022-02-19 ENCOUNTER — Other Ambulatory Visit: Payer: Self-pay

## 2022-02-19 VITALS — BP 107/71 | HR 87 | Temp 97.7°F | Ht 63.0 in | Wt 243.0 lb

## 2022-02-19 DIAGNOSIS — E669 Obesity, unspecified: Secondary | ICD-10-CM

## 2022-02-19 DIAGNOSIS — Z6841 Body Mass Index (BMI) 40.0 and over, adult: Secondary | ICD-10-CM

## 2022-02-19 DIAGNOSIS — Z720 Tobacco use: Secondary | ICD-10-CM

## 2022-02-19 DIAGNOSIS — E559 Vitamin D deficiency, unspecified: Secondary | ICD-10-CM

## 2022-02-19 MED ORDER — VITAMIN D (ERGOCALCIFEROL) 1.25 MG (50000 UNIT) PO CAPS: 50000.0000 [IU] | ORAL_CAPSULE | ORAL | 0 refills | Status: DC

## 2022-02-19 NOTE — Progress Notes (Signed)
Chief Complaint:   OBESITY Tracy Graves is here to discuss her progress with her obesity treatment plan along with follow-up of her obesity related diagnoses. Tracy Graves is on the Category 4 Plan and keeping a food journal and adhering to recommended goals of 1600 calories and 95 protein and states she is following her eating plan approximately 100% of the time. Tracy Graves states she is walking 30 minutes 7 times per week.  Today's visit was #: 22 Starting weight: 249 lbs Starting date: 09/12/2020 Today's weight: 243 lbs Today's date: 02/19/2022 Total lbs lost to date: 7 Total lbs lost since last in-office visit: 0  Interim History: Pt had URI sxs for 2.5 weeks. She is just now getting energy to do anything. She tested negative for COVID 3 times. Pt has been journaling and getting between 1200-1600 calories and 85-90 grams protein per day. She thinks journaling will be easiest to follow.  Subjective:   1. Vitamin D deficiency Pt is on Rx Vit D. Her last Vit D level was 37.7.  2. Tobacco abuse She is smoking 1-2 cigarettes every 1-2 days. She notes significant decline in smoking with URI sxs.  Assessment/Plan:   1. Vitamin D deficiency Low Vitamin D level contributes to fatigue and are associated with obesity, breast, and colon cancer. She agrees to continue to take prescription Vitamin D 50,000 IU every week and will follow-up for routine testing of Vitamin D, at least 2-3 times per year to avoid over-replacement.  Refill- Vitamin D, Ergocalciferol, (DRISDOL) 1.25 MG (50000 UNIT) CAPS capsule; Take 1 capsule (50,000 Units total) by mouth every 7 (seven) days.  Dispense: 4 capsule; Refill: 0  2. Tobacco abuse F/u on cigarette usage at next appt.  3. Obesity with current BMI of 43.1 Tracy Graves is currently in the action stage of change. As such, her goal is to continue with weight loss efforts. She has agreed to keeping a food journal and adhering to recommended goals of 1500-1600 calories and 95+  grams protein.   Exercise goals:  As is  Behavioral modification strategies: increasing lean protein intake, meal planning and cooking strategies, planning for success, and keeping a strict food journal.  Tracy Graves has agreed to follow-up with our clinic in 3-4 weeks, fasting. She was informed of the importance of frequent follow-up visits to maximize her success with intensive lifestyle modifications for her multiple health conditions.   Objective:   Blood pressure 107/71, pulse 87, temperature 97.7 F (36.5 C), height 5\' 3"  (1.6 m), weight 243 lb (110.2 kg), SpO2 97 %. Body mass index is 43.05 kg/m.  General: Cooperative, alert, well developed, in no acute distress. HEENT: Conjunctivae and lids unremarkable. Cardiovascular: Regular rhythm.  Lungs: Normal work of breathing. Neurologic: No focal deficits.   Lab Results  Component Value Date   CREATININE 0.72 07/31/2021   BUN 12 07/31/2021   NA 140 07/31/2021   K 5.3 (H) 07/31/2021   CL 105 07/31/2021   CO2 21 07/31/2021   Lab Results  Component Value Date   ALT 17 07/31/2021   AST 16 07/31/2021   ALKPHOS 109 07/31/2021   BILITOT 0.4 07/31/2021   Lab Results  Component Value Date   HGBA1C 5.6 07/31/2021   HGBA1C 5.5 02/14/2021   HGBA1C 5.6 09/12/2020   HGBA1C 5.3 12/24/2016   HGBA1C 11.2 (H) 06/05/2015   Lab Results  Component Value Date   INSULIN 22.8 07/31/2021   INSULIN 16.8 02/14/2021   INSULIN 41.6 (H) 09/12/2020   Lab  Results  Component Value Date   TSH 1.28 11/20/2021   Lab Results  Component Value Date   CHOL 133 07/31/2021   HDL 60 07/31/2021   LDLCALC 54 07/31/2021   TRIG 104 07/31/2021   Lab Results  Component Value Date   VD25OH 37.7 07/31/2021   VD25OH 34.7 02/14/2021   VD25OH 10.2 (L) 09/12/2020   Lab Results  Component Value Date   WBC 12.4 10/09/2021   HGB 16.9 (A) 10/09/2021   HCT 50 (A) 10/09/2021   MCV 91 02/14/2021   PLT 497 (A) 10/09/2021    Attestation Statements:    Reviewed by clinician on day of visit: allergies, medications, problem list, medical history, surgical history, family history, social history, and previous encounter notes.  Edmund Hilda, CMA, am acting as transcriptionist for [name], MD.   I have reviewed the above documentation for accuracy and completeness, and I agree with the above. - [name], MD

## 2022-03-22 ENCOUNTER — Ambulatory Visit (INDEPENDENT_AMBULATORY_CARE_PROVIDER_SITE_OTHER): Payer: Medicaid Other | Admitting: Family Medicine

## 2022-03-22 ENCOUNTER — Encounter (INDEPENDENT_AMBULATORY_CARE_PROVIDER_SITE_OTHER): Payer: Self-pay | Admitting: Family Medicine

## 2022-03-22 ENCOUNTER — Other Ambulatory Visit: Payer: Self-pay

## 2022-03-22 VITALS — BP 116/80 | HR 84 | Temp 97.9°F | Ht 63.0 in | Wt 244.0 lb

## 2022-03-22 DIAGNOSIS — E1165 Type 2 diabetes mellitus with hyperglycemia: Secondary | ICD-10-CM | POA: Diagnosis not present

## 2022-03-22 DIAGNOSIS — E559 Vitamin D deficiency, unspecified: Secondary | ICD-10-CM | POA: Diagnosis not present

## 2022-03-22 DIAGNOSIS — E1169 Type 2 diabetes mellitus with other specified complication: Secondary | ICD-10-CM

## 2022-03-22 DIAGNOSIS — D751 Secondary polycythemia: Secondary | ICD-10-CM

## 2022-03-22 DIAGNOSIS — E038 Other specified hypothyroidism: Secondary | ICD-10-CM | POA: Diagnosis not present

## 2022-03-22 DIAGNOSIS — Z7984 Long term (current) use of oral hypoglycemic drugs: Secondary | ICD-10-CM

## 2022-03-22 DIAGNOSIS — E785 Hyperlipidemia, unspecified: Secondary | ICD-10-CM | POA: Diagnosis not present

## 2022-03-22 DIAGNOSIS — E669 Obesity, unspecified: Secondary | ICD-10-CM

## 2022-03-22 DIAGNOSIS — Z6841 Body Mass Index (BMI) 40.0 and over, adult: Secondary | ICD-10-CM

## 2022-03-22 DIAGNOSIS — E7849 Other hyperlipidemia: Secondary | ICD-10-CM

## 2022-03-23 LAB — CBC WITH DIFFERENTIAL/PLATELET
Basophils Absolute: 0.1 10*3/uL (ref 0.0–0.2)
Basos: 1 %
EOS (ABSOLUTE): 0.2 10*3/uL (ref 0.0–0.4)
Eos: 2 %
Hematocrit: 49.2 % — ABNORMAL HIGH (ref 34.0–46.6)
Hemoglobin: 16.7 g/dL — ABNORMAL HIGH (ref 11.1–15.9)
Immature Grans (Abs): 0.1 10*3/uL (ref 0.0–0.1)
Immature Granulocytes: 1 %
Lymphocytes Absolute: 2.3 10*3/uL (ref 0.7–3.1)
Lymphs: 23 %
MCH: 31.6 pg (ref 26.6–33.0)
MCHC: 33.9 g/dL (ref 31.5–35.7)
MCV: 93 fL (ref 79–97)
Monocytes Absolute: 0.8 10*3/uL (ref 0.1–0.9)
Monocytes: 8 %
Neutrophils Absolute: 6.6 10*3/uL (ref 1.4–7.0)
Neutrophils: 65 %
Platelets: 463 10*3/uL — ABNORMAL HIGH (ref 150–450)
RBC: 5.29 x10E6/uL — ABNORMAL HIGH (ref 3.77–5.28)
RDW: 12.9 % (ref 11.7–15.4)
WBC: 10 10*3/uL (ref 3.4–10.8)

## 2022-03-23 LAB — COMPREHENSIVE METABOLIC PANEL
ALT: 17 IU/L (ref 0–32)
AST: 15 IU/L (ref 0–40)
Albumin/Globulin Ratio: 1.6 (ref 1.2–2.2)
Albumin: 4.5 g/dL (ref 3.8–4.9)
Alkaline Phosphatase: 120 IU/L (ref 44–121)
BUN/Creatinine Ratio: 19 (ref 12–28)
BUN: 17 mg/dL (ref 8–27)
Bilirubin Total: 0.3 mg/dL (ref 0.0–1.2)
CO2: 21 mmol/L (ref 20–29)
Calcium: 11.2 mg/dL — ABNORMAL HIGH (ref 8.7–10.3)
Chloride: 102 mmol/L (ref 96–106)
Creatinine, Ser: 0.88 mg/dL (ref 0.57–1.00)
Globulin, Total: 2.9 g/dL (ref 1.5–4.5)
Glucose: 104 mg/dL — ABNORMAL HIGH (ref 70–99)
Potassium: 5.2 mmol/L (ref 3.5–5.2)
Sodium: 138 mmol/L (ref 134–144)
Total Protein: 7.4 g/dL (ref 6.0–8.5)
eGFR: 75 mL/min/{1.73_m2} (ref >59)

## 2022-03-23 LAB — HEMOGLOBIN A1C
Est. average glucose Bld gHb Est-mCnc: 111 mg/dL
Hgb A1c MFr Bld: 5.5 % (ref 4.8–5.6)

## 2022-03-23 LAB — INSULIN, RANDOM: INSULIN: 29.2 u[IU]/mL — ABNORMAL HIGH (ref 2.6–24.9)

## 2022-03-23 LAB — LIPID PANEL WITH LDL/HDL RATIO
Cholesterol, Total: 151 mg/dL (ref 100–199)
HDL: 57 mg/dL (ref >39)
LDL Chol Calc (NIH): 75 mg/dL (ref 0–99)
LDL/HDL Ratio: 1.3 ratio (ref 0.0–3.2)
Triglycerides: 105 mg/dL (ref 0–149)
VLDL Cholesterol Cal: 19 mg/dL (ref 5–40)

## 2022-03-23 LAB — VITAMIN D 25 HYDROXY (VIT D DEFICIENCY, FRACTURES): Vit D, 25-Hydroxy: 45.3 ng/mL (ref 30.0–100.0)

## 2022-03-23 NOTE — Progress Notes (Signed)
? ? ? ?Chief Complaint:  ? ?OBESITY ?Tracy Graves is here to discuss her progress with her obesity treatment plan along with follow-up of her obesity related diagnoses. Tracy Graves is on keeping a food journal and adhering to recommended goals of 1500 to 1600 calories and 95+ grams of protein and states she is following her eating plan approximately 100% of the time. Tracy Graves states she has been gardening. ? ?Today's visit was #: 23 ?Starting weight: 249 lbs ?Starting date: 09/12/2020 ?Today's weight: 244 lbs ?Today's date: 03/22/2022 ?Total lbs lost to date: 5 ?Total lbs lost since last in-office visit: 0 ? ?Interim History: Tracy Graves has been journaling most of the time. 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 Tracy Graves is journaling every meal and everyday. She is getting between 1400 to 1600 calories and protein is 85 to 90 grams. Tracy Graves notes occasional hunger just depending on how much activity she is doing. ? ?Subjective:  ? ?1. Type 2 diabetes mellitus with hyperglycemia, without long-term current use of insulin (HCC) ?Medications reviewed. Tracy Graves is on metformin. Her last A1c was 5.6 in August 2022. ? ?Lab Results  ?Component Value Date  ? HGBA1C 5.5 03/22/2022  ? HGBA1C 5.6 07/31/2021  ? HGBA1C 5.5 02/14/2021  ? ?Lab Results  ?Component Value Date  ? LDLCALC 75 03/22/2022  ? CREATININE 0.88 03/22/2022  ? ?2. Hyperlipidemia associated with type 2 diabetes mellitus (HCC) ?Tracy Graves has hyperlipidemia and is on atorvastatin 10 mg. Labs from 07/2021, her LDL was 84, triglycerides were 104, and HDL was 60. She has been trying to improve her cholesterol levels with intensive lifestyle modifications including a low saturated fat diet, exercise, and weight loss. She denies any chest pain, claudication, or myalgias. ? ?Lab Results  ?Component Value Date  ? ALT 17 03/22/2022  ? AST 15 03/22/2022  ? ALKPHOS 120 03/22/2022  ? BILITOT 0.3 03/22/2022  ? ?Lab Results  ?Component Value Date  ? CHOL 151 03/22/2022  ? HDL 57  03/22/2022  ? LDLCALC 75 03/22/2022  ? TRIG 105 03/22/2022  ?  ?3. Vitamin D deficiency ?She is currently taking prescription vitamin D 50,000 IU each week. Her last vitamin D level was 37.7. She denies nausea, vomiting or muscle weakness. ? ?Lab Results  ?Component Value Date  ? VD25OH 45.3 03/22/2022  ? VD25OH 37.7 07/31/2021  ? VD25OH 34.7 02/14/2021  ? ?4. Other specified hypothyroidism ?Tracy Graves's last TSH level was 1.28 and she is on levothyroxine 125mcg. ? ?5. Erythrocytosis ?Tracy Graves reports that she is still smoking. She has elevated RBC and H/H. ? ?Assessment/Plan:  ? ?1. Type 2 diabetes mellitus with hyperglycemia, without long-term current use of insulin (HCC) ?Good blood sugar control is important to decrease the likelihood of diabetic complications such as nephropathy, neuropathy, limb loss, blindness, coronary artery disease, and death. Intensive lifestyle modification including diet, exercise and weight loss are the first line of treatment for diabetes.  ? ?- Insulin, random ?- Hemoglobin A1c ?- Comprehensive metabolic panel ? ?2. Hyperlipidemia associated with type 2 diabetes mellitus (HCC) ?Tracy Graves agrees to continue taking her statin. Cardiovascular risk and specific lipid/LDL goals reviewed.  We discussed several lifestyle modifications today and Tracy Graves will continue to work on diet, exercise and weight loss efforts. Orders and follow up as documented in patient record.  ? ?Counseling ?Intensive lifestyle modifications are the first line treatment for this issue. ?Dietary changes: Increase soluble fiber. Decrease simple carbohydrates. ?Exercise changes: Moderate to vigorous-intensity aerobic activity 150 minutes per  week if tolerated. ?Lipid-lowering medications: see documented in medical record. ?- Lipid Panel With LDL/HDL Ratio ? ?3. Vitamin D deficiency ?We will check her vitamin D level today. Low Vitamin D level contributes to fatigue and are associated with obesity, breast, and colon cancer. She  agrees to follow-up for routine testing of Vitamin D, at least 2-3 times per year to avoid over-replacement. ? ?- VITAMIN D 25 Hydroxy (Vit-D Deficiency, Fractures) ? ?4. Other specified hypothyroidism ?We will check her TSH level today. ? ?- TSH ? ?5. Erythrocytosis ?We will check her CBC today.  ? ?- CBC w/Diff/Platelet ? ?6. Obesity with current BMI of 43.3 ?Tracy Graves is currently in the action stage of change. As such, her goal is to continue with weight loss efforts. She has agreed to keeping a food journal and adhering to recommended goals of 1500 to 1600 calories and 95+ grams of protein.  ? ?Exercise goals:  As is. Pt is gardening ? ?Behavioral modification strategies: increasing lean protein intake, meal planning and cooking strategies, keeping healthy foods in the home, holiday eating strategies , and planning for success. ? ?Tracy Graves has agreed to follow-up with our clinic in 3 weeks. She was informed of the importance of frequent follow-up visits to maximize her success with intensive lifestyle modifications for her multiple health conditions.  ? ?Tracy Graves was informed we would discuss her lab results at her next visit unless there is a critical issue that needs to be addressed sooner. Tracy Graves agreed to keep her next visit at the agreed upon time to discuss these results. ? ?Objective:  ? ?Blood pressure 116/80, pulse 84, temperature 97.9 ?F (36.6 ?C), height 5\' 3"  (1.6 m), weight 244 lb (110.7 kg), SpO2 95 %. ?Body mass index is 43.22 kg/m?. ? ?General: Cooperative, alert, well developed, in no acute distress. ?HEENT: Conjunctivae and lids unremarkable. ?Cardiovascular: Regular rhythm.  ?Lungs: Normal work of breathing. ?Neurologic: No focal deficits.  ? ?Lab Results  ?Component Value Date  ? CREATININE 0.88 03/22/2022  ? BUN 17 03/22/2022  ? NA 138 03/22/2022  ? K 5.2 03/22/2022  ? CL 102 03/22/2022  ? CO2 21 03/22/2022  ? ?Lab Results  ?Component Value Date  ? ALT 17 03/22/2022  ? AST 15 03/22/2022  ? ALKPHOS 120  03/22/2022  ? BILITOT 0.3 03/22/2022  ? ?Lab Results  ?Component Value Date  ? HGBA1C 5.5 03/22/2022  ? HGBA1C 5.6 07/31/2021  ? HGBA1C 5.5 02/14/2021  ? HGBA1C 5.6 09/12/2020  ? HGBA1C 5.3 12/24/2016  ? ?Lab Results  ?Component Value Date  ? INSULIN 29.2 (H) 03/22/2022  ? INSULIN 22.8 07/31/2021  ? INSULIN 16.8 02/14/2021  ? INSULIN 41.6 (H) 09/12/2020  ? ?Lab Results  ?Component Value Date  ? TSH 1.28 11/20/2021  ? ?Lab Results  ?Component Value Date  ? CHOL 151 03/22/2022  ? HDL 57 03/22/2022  ? LDLCALC 75 03/22/2022  ? TRIG 105 03/22/2022  ? ?Lab Results  ?Component Value Date  ? VD25OH 45.3 03/22/2022  ? VD25OH 37.7 07/31/2021  ? VD25OH 34.7 02/14/2021  ? ?Lab Results  ?Component Value Date  ? WBC 10.0 03/22/2022  ? HGB 16.7 (H) 03/22/2022  ? HCT 49.2 (H) 03/22/2022  ? MCV 93 03/22/2022  ? PLT 463 (H) 03/22/2022  ? ?No results found for: IRON, TIBC, FERRITIN ? ? ?Attestation Statements:  ? ?Reviewed by clinician on day of visit: allergies, medications, problem list, medical history, surgical history, family history, social history, and previous encounter notes. ? ?I,  Kirke Corin, CMA, am acting as transcriptionist for [name], MD ? ?I have reviewed the above documentation for accuracy and completeness, and I agree with the above. [name], MD ? ?

## 2022-03-27 LAB — TSH: TSH: 1.75 u[IU]/mL (ref 0.450–4.500)

## 2022-03-27 LAB — SPECIMEN STATUS REPORT

## 2022-04-11 DIAGNOSIS — E119 Type 2 diabetes mellitus without complications: Secondary | ICD-10-CM | POA: Diagnosis not present

## 2022-04-11 DIAGNOSIS — I10 Essential (primary) hypertension: Secondary | ICD-10-CM | POA: Diagnosis not present

## 2022-04-11 DIAGNOSIS — Z720 Tobacco use: Secondary | ICD-10-CM | POA: Diagnosis not present

## 2022-04-11 DIAGNOSIS — E785 Hyperlipidemia, unspecified: Secondary | ICD-10-CM | POA: Diagnosis not present

## 2022-04-11 DIAGNOSIS — E039 Hypothyroidism, unspecified: Secondary | ICD-10-CM | POA: Diagnosis not present

## 2022-04-16 ENCOUNTER — Encounter (INDEPENDENT_AMBULATORY_CARE_PROVIDER_SITE_OTHER): Payer: Self-pay | Admitting: Family Medicine

## 2022-04-16 ENCOUNTER — Ambulatory Visit (INDEPENDENT_AMBULATORY_CARE_PROVIDER_SITE_OTHER): Payer: Medicaid Other | Admitting: Family Medicine

## 2022-04-16 VITALS — BP 113/73 | HR 80 | Temp 98.2°F | Ht 63.0 in | Wt 246.0 lb

## 2022-04-16 DIAGNOSIS — E1169 Type 2 diabetes mellitus with other specified complication: Secondary | ICD-10-CM

## 2022-04-16 DIAGNOSIS — Z6841 Body Mass Index (BMI) 40.0 and over, adult: Secondary | ICD-10-CM | POA: Diagnosis not present

## 2022-04-16 DIAGNOSIS — E669 Obesity, unspecified: Secondary | ICD-10-CM

## 2022-04-16 DIAGNOSIS — E785 Hyperlipidemia, unspecified: Secondary | ICD-10-CM

## 2022-04-16 DIAGNOSIS — E559 Vitamin D deficiency, unspecified: Secondary | ICD-10-CM

## 2022-04-16 DIAGNOSIS — D751 Secondary polycythemia: Secondary | ICD-10-CM | POA: Diagnosis not present

## 2022-04-16 MED ORDER — ATORVASTATIN CALCIUM 10 MG PO TABS: 10.0000 mg | ORAL_TABLET | Freq: Every day | ORAL | 0 refills | Status: DC

## 2022-04-16 MED ORDER — VITAMIN D (ERGOCALCIFEROL) 1.25 MG (50000 UNIT) PO CAPS: 50000.0000 [IU] | ORAL_CAPSULE | ORAL | 0 refills | Status: DC

## 2022-04-28 NOTE — Progress Notes (Signed)
Chief Complaint:   OBESITY Tracy Graves is here to discuss her progress with her obesity treatment plan along with follow-up of her obesity related diagnoses. Tracy Graves is on keeping a food journal and adhering to recommended goals of 1500 to 1600 calories and 95 grams of protein and states she is following her eating plan approximately 20% of the time. [name] states she is going to the gym 90 minutes 1 time per week.  Today's visit was #: 24 Starting weight: 249 lbs Starting date: 09/12/2020 Today's weight: 246 lbs Today's date: 04/16/2022 Total lbs lost to date: 3 Total lbs lost since last in-office visit: 0  Interim History: [name] has been stressed since her last visit. Her sister struggled with a significant health related setback and was hospitalized with multi-organ system failure. [name] has been spending a significant amount of time with her at the hospital. There does not look like any planned obstacles over the next few weeks. [name] did not increase cigarette consumption with the increase in stress.  Subjective:   1. Hyperlipidemia associated with type 2 diabetes mellitus (HCC) [name]'s last LDL was 75, HDL was 57, and triglycerides were 105. She is on Lipitor with no transaminitis or myalgias.  2. Vitamin D deficiency Tracy Graves's vitamin D level was 45.3. She notes fatigue.  3. Erythrocytosis Tracy Graves's HGB/HCT is the same as her prior lab draw. She is still smoking 1 to 2 cigarettes a day.  Assessment/Plan:   1. Hyperlipidemia associated with type 2 diabetes mellitus (HCC) Guillermo agrees to continue taking Lipitor and will follow up as directed.  - atorvastatin (LIPITOR) 10 MG tablet; Take 1 tablet (10 mg total) by mouth daily.  Dispense: 90 tablet; Refill: 0  2. Vitamin D deficiency Tracy Graves agrees to continue taking prescription vitamin D 50,000IU per week and will follow up at the agreed upon time.  - Vitamin D, Ergocalciferol, (DRISDOL) 1.25 MG (50000 UNIT) CAPS capsule; Take 1  capsule (50,000 Units total) by mouth every 7 (seven) days.  Dispense: 4 capsule; Refill: 0  3. Erythrocytosis We will follow up with smoking cessation at the next appointment.  4. Obesity with current BMI of 43.7 Tracy Graves is currently in the action stage of change. As such, her goal is to continue with weight loss efforts. She has agreed to the Category 3 Plan.   Exercise goals: All adults should avoid inactivity. Some physical activity is better than none, and adults who participate in any amount of physical activity gain some health benefits.  Behavioral modification strategies: increasing lean protein intake, meal planning and cooking strategies, and keeping healthy foods in the home.  Tracy Graves has agreed to follow-up with our clinic in 4 weeks. She was informed of the importance of frequent follow-up visits to maximize her success with intensive lifestyle modifications for her multiple health conditions.   Objective:   Blood pressure 113/73, pulse 80, temperature 98.2 F (36.8 C), height 5\' 3"  (1.6 m), weight 246 lb (111.6 kg), SpO2 95 %. Body mass index is 43.58 kg/m.  General: Cooperative, alert, well developed, in no acute distress. HEENT: Conjunctivae and lids unremarkable. Cardiovascular: Regular rhythm.  Lungs: Normal work of breathing. Neurologic: No focal deficits.   Lab Results  Component Value Date   CREATININE 0.88 03/22/2022   BUN 17 03/22/2022   NA 138 03/22/2022   K 5.2 03/22/2022   CL 102 03/22/2022   CO2 21 03/22/2022   Lab Results  Component Value Date   ALT 17 03/22/2022  AST 15 03/22/2022   ALKPHOS 120 03/22/2022   BILITOT 0.3 03/22/2022   Lab Results  Component Value Date   HGBA1C 5.5 03/22/2022   HGBA1C 5.6 07/31/2021   HGBA1C 5.5 02/14/2021   HGBA1C 5.6 09/12/2020   HGBA1C 5.3 12/24/2016   Lab Results  Component Value Date   INSULIN 29.2 (H) 03/22/2022   INSULIN 22.8 07/31/2021   INSULIN 16.8 02/14/2021   INSULIN 41.6 (H) 09/12/2020    Lab Results  Component Value Date   TSH 1.750 03/22/2022   Lab Results  Component Value Date   CHOL 151 03/22/2022   HDL 57 03/22/2022   LDLCALC 75 03/22/2022   TRIG 105 03/22/2022   Lab Results  Component Value Date   VD25OH 45.3 03/22/2022   VD25OH 37.7 07/31/2021   VD25OH 34.7 02/14/2021   Lab Results  Component Value Date   WBC 10.0 03/22/2022   HGB 16.7 (H) 03/22/2022   HCT 49.2 (H) 03/22/2022   MCV 93 03/22/2022   PLT 463 (H) 03/22/2022   No results found for: IRON, TIBC, FERRITIN  Attestation Statements:   Reviewed by clinician on day of visit: allergies, medications, problem list, medical history, surgical history, family history, social history, and previous encounter notes.  IKirke Corin, CMA, am acting as transcriptionist for [name], MD  I have reviewed the above documentation for accuracy and completeness, and I agree with the above. - [name], MD

## 2022-05-14 ENCOUNTER — Ambulatory Visit (INDEPENDENT_AMBULATORY_CARE_PROVIDER_SITE_OTHER): Payer: Medicaid Other | Admitting: Family Medicine

## 2022-05-14 ENCOUNTER — Encounter (INDEPENDENT_AMBULATORY_CARE_PROVIDER_SITE_OTHER): Payer: Self-pay | Admitting: Family Medicine

## 2022-05-14 VITALS — BP 118/75 | HR 75 | Temp 98.2°F | Ht 63.0 in | Wt 246.0 lb

## 2022-05-14 DIAGNOSIS — J302 Other seasonal allergic rhinitis: Secondary | ICD-10-CM

## 2022-05-14 DIAGNOSIS — E669 Obesity, unspecified: Secondary | ICD-10-CM

## 2022-05-14 DIAGNOSIS — Z6841 Body Mass Index (BMI) 40.0 and over, adult: Secondary | ICD-10-CM | POA: Diagnosis not present

## 2022-05-14 DIAGNOSIS — E559 Vitamin D deficiency, unspecified: Secondary | ICD-10-CM

## 2022-05-14 DIAGNOSIS — E66813 Obesity, class 3: Secondary | ICD-10-CM

## 2022-05-14 MED ORDER — VITAMIN D (ERGOCALCIFEROL) 1.25 MG (50000 UNIT) PO CAPS: 50000.0000 [IU] | ORAL_CAPSULE | ORAL | 0 refills | Status: DC

## 2022-05-17 NOTE — Progress Notes (Signed)
Chief Complaint:   OBESITY Tracy Graves is here to discuss her progress with her obesity treatment plan along with follow-up of her obesity related diagnoses. Tracy Graves is on the Category 3 Plan and states she is following her eating plan approximately 100% of the time. Tracy Graves states she is doing aerobics and going to pool 60 minutes 3 times per week.  Today's visit was #: 25 Starting weight: 249 lbs Starting date: 09/12/2020 Today's weight: 246 lbs Today's date: 05/17/2022 Total lbs lost to date: 3 Total lbs lost since last in-office visit: 0  Interim History: Tracy Graves has been go go going all last few weeks. She is helping her daughter move. She has been staying on meal plan almost 100%. Noticing no hunger between lunch and dinner but significant hunger between breakfast and lunch. No big plans over next few weeks.  Subjective:   1. Vitamin D deficiency Tracy Graves is currently taking prescription Vit D. Her last Vit D level at 45.3 on 03/22/22.  2. Seasonal allergies Tracy Graves is taking daily Claritin (generic). She is still experiencing some symptoms.  Assessment/Plan:   1. Vitamin D deficiency We will refill Vit D 50K IU/weekly for 1 month with zero refills.  -Refill Vitamin D, Ergocalciferol, (DRISDOL) 1.25 MG (50000 UNIT) CAPS capsule; Take 1 capsule (50,000 Units total) by mouth every 7 (seven) days.  Dispense: 4 capsule; Refill: 0  2. Seasonal allergies Tracy Graves will continue Claritin over the counter -may consider adding Flonase daily for allergy control.  3. Obesity with current BMI of 43.6 Tracy Graves is currently in the action stage of change. As such, her goal is to continue with weight loss efforts. She has agreed to the Category 3 Plan and the Category 4 Plan. Cat 3 for lunch and dinner. Cat 4 for breakfast.  Exercise goals: All adults should avoid inactivity. Some physical activity is better than none, and adults who participate in any amount of physical activity gain some health  benefits.  Behavioral modification strategies: increasing lean protein intake, meal planning and cooking strategies, and keeping healthy foods in the home.  Tracy Graves has agreed to follow-up with our clinic in 4 weeks. She was informed of the importance of frequent follow-up visits to maximize her success with intensive lifestyle modifications for her multiple health conditions.   Objective:   Blood pressure 118/75, pulse 75, temperature 98.2 F (36.8 C), height 5\' 3"  (1.6 m), weight 246 lb (111.6 kg), SpO2 96 %. Body mass index is 43.58 kg/m.  General: Cooperative, alert, well developed, in no acute distress. HEENT: Conjunctivae and lids unremarkable. Cardiovascular: Regular rhythm.  Lungs: Normal work of breathing. Neurologic: No focal deficits.   Lab Results  Component Value Date   CREATININE 0.88 03/22/2022   BUN 17 03/22/2022   NA 138 03/22/2022   K 5.2 03/22/2022   CL 102 03/22/2022   CO2 21 03/22/2022   Lab Results  Component Value Date   ALT 17 03/22/2022   AST 15 03/22/2022   ALKPHOS 120 03/22/2022   BILITOT 0.3 03/22/2022   Lab Results  Component Value Date   HGBA1C 5.5 03/22/2022   HGBA1C 5.6 07/31/2021   HGBA1C 5.5 02/14/2021   HGBA1C 5.6 09/12/2020   HGBA1C 5.3 12/24/2016   Lab Results  Component Value Date   INSULIN 29.2 (H) 03/22/2022   INSULIN 22.8 07/31/2021   INSULIN 16.8 02/14/2021   INSULIN 41.6 (H) 09/12/2020   Lab Results  Component Value Date   TSH 1.750 03/22/2022   Lab  Results  Component Value Date   CHOL 151 03/22/2022   HDL 57 03/22/2022   LDLCALC 75 03/22/2022   TRIG 105 03/22/2022   Lab Results  Component Value Date   VD25OH 45.3 03/22/2022   VD25OH 37.7 07/31/2021   VD25OH 34.7 02/14/2021   Lab Results  Component Value Date   WBC 10.0 03/22/2022   HGB 16.7 (H) 03/22/2022   HCT 49.2 (H) 03/22/2022   MCV 93 03/22/2022   PLT 463 (H) 03/22/2022   No results found for: IRON, TIBC, FERRITIN  Attestation Statements:    Reviewed by clinician on day of visit: allergies, medications, problem list, medical history, surgical history, family history, social history, and previous encounter notes.  I, Fortino Sic, RMA am acting as transcriptionist for [name], MD.  I have reviewed the above documentation for accuracy and completeness, and I agree with the above. - [name], MD

## 2022-06-11 ENCOUNTER — Encounter (INDEPENDENT_AMBULATORY_CARE_PROVIDER_SITE_OTHER): Payer: Self-pay | Admitting: Family Medicine

## 2022-06-11 ENCOUNTER — Ambulatory Visit (INDEPENDENT_AMBULATORY_CARE_PROVIDER_SITE_OTHER): Payer: Medicaid Other | Admitting: Family Medicine

## 2022-06-11 VITALS — BP 131/87 | HR 80 | Temp 98.0°F | Ht 63.0 in | Wt 246.0 lb

## 2022-06-11 DIAGNOSIS — E559 Vitamin D deficiency, unspecified: Secondary | ICD-10-CM | POA: Diagnosis not present

## 2022-06-11 DIAGNOSIS — R0602 Shortness of breath: Secondary | ICD-10-CM

## 2022-06-11 DIAGNOSIS — Z6841 Body Mass Index (BMI) 40.0 and over, adult: Secondary | ICD-10-CM

## 2022-06-11 DIAGNOSIS — E669 Obesity, unspecified: Secondary | ICD-10-CM

## 2022-06-11 DIAGNOSIS — E66813 Obesity, class 3: Secondary | ICD-10-CM

## 2022-06-11 MED ORDER — VITAMIN D (ERGOCALCIFEROL) 1.25 MG (50000 UNIT) PO CAPS: 50000.0000 [IU] | ORAL_CAPSULE | ORAL | 0 refills | Status: DC

## 2022-06-12 NOTE — Progress Notes (Unsigned)
Chief Complaint:   OBESITY Tracy Graves is here to discuss her progress with her obesity treatment plan along with follow-up of her obesity related diagnoses. Tracy Graves is on the Category 4 Plan and states she is following her eating plan approximately 98% of the time. Tracy Graves states she is swimming and walking 60 minutes 5 times per week.  Today's visit was #: 26 Starting weight: 249 lbs Starting date: 09/12/2020 Today's weight: 246 lbs Today's date: 06/11/2022 Total lbs lost to date: 3 lbs Total lbs lost since last in-office visit: 0  Interim History: Tracy Graves was able to get almost all food in on Cat 4. She is doing quite a bit of physical activity over the course of the week. Not much coming up for herself. Not any travels for the next month.  Subjective:   1. Vitamin D deficiency Tracy Graves is currently taking prescription Vit D 50,000 IU once a week. Denies any nausea, vomiting or muscle weakness. She notes fatigue.  2. SOB (shortness of breath) Symptoms improved since initial appointment. Her last RMR 1896.  Assessment/Plan:   1. Vitamin D deficiency We will refill Vit D 50,000 IU once a week for 1 month with 0 refills.  -Refill Vitamin D, Ergocalciferol, (DRISDOL) 1.25 MG (50000 UNIT) CAPS capsule; Take 1 capsule (50,000 Units total) by mouth every 7 (seven) days.  Dispense: 4 capsule; Refill: 0  2. SOB (shortness of breath) IC performed today at 2290- significant improvement from prior IC. Increase Cat 4+100-200.  3. Obesity with current BMI of 43.7 Tracy Graves is currently in the action stage of change. As such, her goal is to continue with weight loss efforts. She has agreed to the Category 4 Plan +200.  Exercise goals: As is.   Behavioral modification strategies: increasing lean protein intake, meal planning and cooking strategies, keeping healthy foods in the home, and planning for success.  Tracy Graves has agreed to follow-up with our clinic in 5 weeks. She was informed of the importance  of frequent follow-up visits to maximize her success with intensive lifestyle modifications for her multiple health conditions.   Objective:   Blood pressure 131/87, pulse 80, temperature 98 F (36.7 C), height 5\' 3"  (1.6 m), weight 246 lb (111.6 kg), SpO2 95 %. Body mass index is 43.58 kg/m.  General: Cooperative, alert, well developed, in no acute distress. HEENT: Conjunctivae and lids unremarkable. Cardiovascular: Regular rhythm.  Lungs: Normal work of breathing. Neurologic: No focal deficits.   Lab Results  Component Value Date   CREATININE 0.88 03/22/2022   BUN 17 03/22/2022   NA 138 03/22/2022   K 5.2 03/22/2022   CL 102 03/22/2022   CO2 21 03/22/2022   Lab Results  Component Value Date   ALT 17 03/22/2022   AST 15 03/22/2022   ALKPHOS 120 03/22/2022   BILITOT 0.3 03/22/2022   Lab Results  Component Value Date   HGBA1C 5.5 03/22/2022   HGBA1C 5.6 07/31/2021   HGBA1C 5.5 02/14/2021   HGBA1C 5.6 09/12/2020   HGBA1C 5.3 12/24/2016   Lab Results  Component Value Date   INSULIN 29.2 (H) 03/22/2022   INSULIN 22.8 07/31/2021   INSULIN 16.8 02/14/2021   INSULIN 41.6 (H) 09/12/2020   Lab Results  Component Value Date   TSH 1.750 03/22/2022   Lab Results  Component Value Date   CHOL 151 03/22/2022   HDL 57 03/22/2022   LDLCALC 75 03/22/2022   TRIG 105 03/22/2022   Lab Results  Component Value Date  VD25OH 45.3 03/22/2022   VD25OH 37.7 07/31/2021   VD25OH 34.7 02/14/2021   Lab Results  Component Value Date   WBC 10.0 03/22/2022   HGB 16.7 (H) 03/22/2022   HCT 49.2 (H) 03/22/2022   MCV 93 03/22/2022   PLT 463 (H) 03/22/2022   No results found for: "IRON", "TIBC", "FERRITIN"  Attestation Statements:   Reviewed by clinician on day of visit: allergies, medications, problem list, medical history, surgical history, family history, social history, and previous encounter notes.  I, Fortino Sic, RMA am acting as transcriptionist for [name],  MD.  I have reviewed the above documentation for accuracy and completeness, and I agree with the above. -  ***

## 2022-07-05 ENCOUNTER — Other Ambulatory Visit (INDEPENDENT_AMBULATORY_CARE_PROVIDER_SITE_OTHER): Payer: Self-pay | Admitting: Family Medicine

## 2022-07-05 DIAGNOSIS — E559 Vitamin D deficiency, unspecified: Secondary | ICD-10-CM

## 2022-07-09 ENCOUNTER — Encounter (INDEPENDENT_AMBULATORY_CARE_PROVIDER_SITE_OTHER): Payer: Self-pay | Admitting: Family Medicine

## 2022-07-09 ENCOUNTER — Ambulatory Visit (INDEPENDENT_AMBULATORY_CARE_PROVIDER_SITE_OTHER): Payer: Medicaid Other | Admitting: Family Medicine

## 2022-07-09 VITALS — BP 102/69 | HR 85 | Temp 97.8°F | Ht 63.0 in | Wt 245.0 lb

## 2022-07-09 DIAGNOSIS — E669 Obesity, unspecified: Secondary | ICD-10-CM | POA: Diagnosis not present

## 2022-07-09 DIAGNOSIS — Z6841 Body Mass Index (BMI) 40.0 and over, adult: Secondary | ICD-10-CM

## 2022-07-09 DIAGNOSIS — E1169 Type 2 diabetes mellitus with other specified complication: Secondary | ICD-10-CM

## 2022-07-09 DIAGNOSIS — F17211 Nicotine dependence, cigarettes, in remission: Secondary | ICD-10-CM

## 2022-07-09 DIAGNOSIS — E785 Hyperlipidemia, unspecified: Secondary | ICD-10-CM | POA: Diagnosis not present

## 2022-07-09 DIAGNOSIS — Z720 Tobacco use: Secondary | ICD-10-CM

## 2022-07-09 DIAGNOSIS — E559 Vitamin D deficiency, unspecified: Secondary | ICD-10-CM | POA: Diagnosis not present

## 2022-07-09 MED ORDER — ATORVASTATIN CALCIUM 10 MG PO TABS: 10.0000 mg | ORAL_TABLET | Freq: Every day | ORAL | 0 refills | Status: DC

## 2022-07-09 MED ORDER — VITAMIN D (ERGOCALCIFEROL) 1.25 MG (50000 UNIT) PO CAPS: 50000.0000 [IU] | ORAL_CAPSULE | ORAL | 0 refills | Status: DC

## 2022-07-10 NOTE — Progress Notes (Signed)
Chief Complaint:   OBESITY Tracy Graves is here to discuss her progress with her obesity treatment plan along with follow-up of her obesity related diagnoses. Tracy Graves is on the Category 4 Plan + 200 and states she is following her eating plan approximately 100% of the time. Tracy Graves states she is walking, swimming and resistance 30-60 minutes 7/3/5 times per week.  Today's visit was #: 27  Starting weight: 249 lbs Starting date: 09/12/2020 Today's weight: 245 lbs Today's date: 07/09/2022 Total lbs lost to date: 4 lbs Total lbs lost since last in-office visit: 1  Interim History: Tracy Graves has been eating on plan. Denies hunger. Breakfast, 3 eggs, two pieces of toast and yogurt. Lunch is 3-4 oz meat on sandwich and fruit. Same at supper. She does vegetables for dinner too. Snack calories are string cheese and yogurt. Thinks at supper she is getting 6-8 oz/dinner.    Subjective:   1. Hyperlipidemia associated with type 2 diabetes mellitus (HCC) Tracy Graves is currently taking Lipitor 10 mg daily with no myalgias or transaminitis.  2. Vitamin D deficiency Tracy Graves is currently taking prescription Vit D 50,000 IU once a week. Denies any nausea, vomiting or muscle weakness. She notes fatigue.  3. Tobacco abuse Tracy Graves still smoking a few cigarettes daily. No quite date currently.  Assessment/Plan:   1. Hyperlipidemia associated with type 2 diabetes mellitus (HCC) We will refill Atorvastatin 10 mg by mouth daily for 1 month with 0 refills.  -Refill atorvastatin (LIPITOR) 10 MG tablet; Take 1 tablet (10 mg total) by mouth daily.  Dispense: 90 tablet; Refill: 0  2. Vitamin D deficiency We will refill Vit D 50,000 IU once a week for 1 month with 0 refills.  -Refill Vitamin D, Ergocalciferol, (DRISDOL) 1.25 MG (50000 UNIT) CAPS capsule; Take 1 capsule (50,000 Units total) by mouth every 7 (seven) days.  Dispense: 4 capsule; Refill: 0  3. Tobacco abuse Follow up at next appointment for quite date.  4.  Obesity with current BMI of 43.4 Tracy Graves is currently in the action stage of change. As such, her goal is to continue with weight loss efforts. She has agreed to the Category 3 Plan and the Category 4 Plan.   Exercise goals: As is.   Continue current combo resistance, walk and swimming.   Behavioral modification strategies: increasing lean protein intake, meal planning and cooking strategies, keeping healthy foods in the home, and planning for success.  Tracy Graves has agreed to follow-up with our clinic in 4 weeks. She was informed of the importance of frequent follow-up visits to maximize her success with intensive lifestyle modifications for her multiple health conditions.   Objective:   Blood pressure 102/69, pulse 85, temperature 97.8 F (36.6 C), height 5\' 3"  (1.6 m), weight 245 lb (111.1 kg), SpO2 96 %. Body mass index is 43.4 kg/m.  General: Cooperative, alert, well developed, in no acute distress. HEENT: Conjunctivae and lids unremarkable. Cardiovascular: Regular rhythm.  Lungs: Normal work of breathing. Neurologic: No focal deficits.   Lab Results  Component Value Date   CREATININE 0.88 03/22/2022   BUN 17 03/22/2022   NA 138 03/22/2022   K 5.2 03/22/2022   CL 102 03/22/2022   CO2 21 03/22/2022   Lab Results  Component Value Date   ALT 17 03/22/2022   AST 15 03/22/2022   ALKPHOS 120 03/22/2022   BILITOT 0.3 03/22/2022   Lab Results  Component Value Date   HGBA1C 5.5 03/22/2022   HGBA1C 5.6 07/31/2021  HGBA1C 5.5 02/14/2021   HGBA1C 5.6 09/12/2020   HGBA1C 5.3 12/24/2016   Lab Results  Component Value Date   INSULIN 29.2 (H) 03/22/2022   INSULIN 22.8 07/31/2021   INSULIN 16.8 02/14/2021   INSULIN 41.6 (H) 09/12/2020   Lab Results  Component Value Date   TSH 1.750 03/22/2022   Lab Results  Component Value Date   CHOL 151 03/22/2022   HDL 57 03/22/2022   LDLCALC 75 03/22/2022   TRIG 105 03/22/2022   Lab Results  Component Value Date   VD25OH 45.3  03/22/2022   VD25OH 37.7 07/31/2021   VD25OH 34.7 02/14/2021   Lab Results  Component Value Date   WBC 10.0 03/22/2022   HGB 16.7 (H) 03/22/2022   HCT 49.2 (H) 03/22/2022   MCV 93 03/22/2022   PLT 463 (H) 03/22/2022   No results found for: "IRON", "TIBC", "FERRITIN"  Attestation Statements:   Reviewed by clinician on day of visit: allergies, medications, problem list, medical history, surgical history, family history, social history, and previous encounter notes.  I, Fortino Sic, RMA am acting as transcriptionist for [name], MD. I have reviewed the above documentation for accuracy and completeness, and I agree with the above. - [name], MD

## 2022-08-07 ENCOUNTER — Encounter (INDEPENDENT_AMBULATORY_CARE_PROVIDER_SITE_OTHER): Payer: Self-pay | Admitting: Family Medicine

## 2022-08-07 ENCOUNTER — Ambulatory Visit (INDEPENDENT_AMBULATORY_CARE_PROVIDER_SITE_OTHER): Payer: Medicaid Other | Admitting: Family Medicine

## 2022-08-07 VITALS — BP 113/75 | HR 80 | Temp 98.1°F | Ht 63.0 in | Wt 248.0 lb

## 2022-08-07 DIAGNOSIS — Z720 Tobacco use: Secondary | ICD-10-CM | POA: Diagnosis not present

## 2022-08-07 DIAGNOSIS — Z7984 Long term (current) use of oral hypoglycemic drugs: Secondary | ICD-10-CM

## 2022-08-07 DIAGNOSIS — Z6841 Body Mass Index (BMI) 40.0 and over, adult: Secondary | ICD-10-CM | POA: Diagnosis not present

## 2022-08-07 DIAGNOSIS — E1165 Type 2 diabetes mellitus with hyperglycemia: Secondary | ICD-10-CM

## 2022-08-07 DIAGNOSIS — E559 Vitamin D deficiency, unspecified: Secondary | ICD-10-CM

## 2022-08-07 DIAGNOSIS — E669 Obesity, unspecified: Secondary | ICD-10-CM | POA: Diagnosis not present

## 2022-08-07 DIAGNOSIS — E66813 Obesity, class 3: Secondary | ICD-10-CM

## 2022-08-07 MED ORDER — VITAMIN D (ERGOCALCIFEROL) 1.25 MG (50000 UNIT) PO CAPS: 50000.0000 [IU] | ORAL_CAPSULE | ORAL | 0 refills | Status: DC

## 2022-08-08 ENCOUNTER — Encounter (INDEPENDENT_AMBULATORY_CARE_PROVIDER_SITE_OTHER): Payer: Self-pay

## 2022-08-09 NOTE — Progress Notes (Signed)
Chief Complaint:   OBESITY Tracy Graves is here to discuss her progress with her obesity treatment plan along with follow-up of her obesity related diagnoses. Tracy Graves is on the Category 3 Plan and the Category 4 Plan and states she is following her eating plan approximately 100% of the time. Tracy Graves states she is walking/swimming/resistance 30/60/30 minutes 7/2-3/4 times per week.  Today's visit was #: 28 Starting weight: 249 lbs Starting date: 09/12/2020 Today's weight: 248 lbs Today's date: 08/07/2022 Total lbs lost to date: 1 lb Total lbs lost since last in-office visit: 0  Interim History: Tracy Graves is working consistently on getting all food in. She feels frustrated with weight gain. Breakfast is 3 eggs, toast, yogurt or 2 slices turkey/chicken, bacon. Lunch will be a sandwich. Supper will be meat and veggies. Sometimes she may do a salad for supper or lunch. She mentions she has not done much snacking. Wondering about adding different fruits in.  Subjective:   1. Vitamin D deficiency Tracy Graves is currently taking prescription Vit D. Her last Vit D level of 45.3.  2. Type 2 diabetes mellitus with hyperglycemia, without long-term current use of insulin (HCC) Tracy Graves is currently taking Metformin. Denies GI side effects.  3. Tobacco abuse Tracy Graves's quit date is October 31. She is gathering quit materials.  Assessment/Plan:   1. Vitamin D deficiency We will refill Vit D 50,000 IU once a week for 1 month with 0 refills.  -Refill Vitamin D, Ergocalciferol, (DRISDOL) 1.25 MG (50000 UNIT) CAPS capsule; Take 1 capsule (50,000 Units total) by mouth every 7 (seven) days.  Dispense: 4 capsule; Refill: 0  2. Type 2 diabetes mellitus with hyperglycemia, without long-term current use of insulin (HCC) Tracy Graves will continue taking Metformin without any changes in dosage. Will obtain labs at next appointment.  3. Tobacco abuse Tracy Graves will follow up with plan at next appointment.  4. Obesity with current BMI  of 44.0 Tracy Graves is currently in the action stage of change. As such, her goal is to continue with weight loss efforts. She has agreed to the Category 3 Plan.   Exercise goals: All adults should avoid inactivity. Some physical activity is better than none, and adults who participate in any amount of physical activity gain some health benefits.  Tracy Graves has been doing a decent amount of activity.  Behavioral modification strategies: increasing lean protein intake, meal planning and cooking strategies, keeping healthy foods in the home, and planning for success.  Tracy Graves has agreed to follow-up with our clinic in 4 weeks. She was informed of the importance of frequent follow-up visits to maximize her success with intensive lifestyle modifications for her multiple health conditions.   Objective:   Blood pressure 113/75, pulse 80, temperature 98.1 F (36.7 C), height 5\' 3"  (1.6 m), weight 248 lb (112.5 kg), SpO2 96 %. Body mass index is 43.93 kg/m.  General: Cooperative, alert, well developed, in no acute distress. HEENT: Conjunctivae and lids unremarkable. Cardiovascular: Regular rhythm.  Lungs: Normal work of breathing. Neurologic: No focal deficits.   Lab Results  Component Value Date   CREATININE 0.88 03/22/2022   BUN 17 03/22/2022   NA 138 03/22/2022   K 5.2 03/22/2022   CL 102 03/22/2022   CO2 21 03/22/2022   Lab Results  Component Value Date   ALT 17 03/22/2022   AST 15 03/22/2022   ALKPHOS 120 03/22/2022   BILITOT 0.3 03/22/2022   Lab Results  Component Value Date   HGBA1C 5.5 03/22/2022  HGBA1C 5.6 07/31/2021   HGBA1C 5.5 02/14/2021   HGBA1C 5.6 09/12/2020   HGBA1C 5.3 12/24/2016   Lab Results  Component Value Date   INSULIN 29.2 (H) 03/22/2022   INSULIN 22.8 07/31/2021   INSULIN 16.8 02/14/2021   INSULIN 41.6 (H) 09/12/2020   Lab Results  Component Value Date   TSH 1.750 03/22/2022   Lab Results  Component Value Date   CHOL 151 03/22/2022   HDL 57  03/22/2022   LDLCALC 75 03/22/2022   TRIG 105 03/22/2022   Lab Results  Component Value Date   VD25OH 45.3 03/22/2022   VD25OH 37.7 07/31/2021   VD25OH 34.7 02/14/2021   Lab Results  Component Value Date   WBC 10.0 03/22/2022   HGB 16.7 (H) 03/22/2022   HCT 49.2 (H) 03/22/2022   MCV 93 03/22/2022   PLT 463 (H) 03/22/2022   No results found for: "IRON", "TIBC", "FERRITIN"  Attestation Statements:   Reviewed by clinician on day of visit: allergies, medications, problem list, medical history, surgical history, family history, social history, and previous encounter notes.  I, Fortino Sic, RMA am acting as transcriptionist for [name], MD.  I have reviewed the above documentation for accuracy and completeness, and I agree with the above. - [name], MD

## 2022-08-30 DIAGNOSIS — Z09 Encounter for follow-up examination after completed treatment for conditions other than malignant neoplasm: Secondary | ICD-10-CM | POA: Diagnosis not present

## 2022-08-30 DIAGNOSIS — K449 Diaphragmatic hernia without obstruction or gangrene: Secondary | ICD-10-CM | POA: Diagnosis not present

## 2022-08-30 DIAGNOSIS — Z720 Tobacco use: Secondary | ICD-10-CM | POA: Diagnosis not present

## 2022-09-10 ENCOUNTER — Ambulatory Visit (INDEPENDENT_AMBULATORY_CARE_PROVIDER_SITE_OTHER): Payer: Medicaid Other | Admitting: Family Medicine

## 2022-09-11 ENCOUNTER — Ambulatory Visit (INDEPENDENT_AMBULATORY_CARE_PROVIDER_SITE_OTHER): Payer: Medicaid Other | Admitting: Nurse Practitioner

## 2022-09-11 ENCOUNTER — Encounter (INDEPENDENT_AMBULATORY_CARE_PROVIDER_SITE_OTHER): Payer: Self-pay | Admitting: Nurse Practitioner

## 2022-09-11 VITALS — BP 124/82 | HR 90 | Temp 97.9°F | Ht 63.0 in | Wt 246.0 lb

## 2022-09-11 DIAGNOSIS — Z6841 Body Mass Index (BMI) 40.0 and over, adult: Secondary | ICD-10-CM

## 2022-09-11 DIAGNOSIS — E559 Vitamin D deficiency, unspecified: Secondary | ICD-10-CM | POA: Diagnosis not present

## 2022-09-11 DIAGNOSIS — Z7984 Long term (current) use of oral hypoglycemic drugs: Secondary | ICD-10-CM

## 2022-09-11 DIAGNOSIS — Z79899 Other long term (current) drug therapy: Secondary | ICD-10-CM | POA: Diagnosis not present

## 2022-09-11 DIAGNOSIS — E1169 Type 2 diabetes mellitus with other specified complication: Secondary | ICD-10-CM

## 2022-09-11 DIAGNOSIS — E039 Hypothyroidism, unspecified: Secondary | ICD-10-CM

## 2022-09-11 DIAGNOSIS — E785 Hyperlipidemia, unspecified: Secondary | ICD-10-CM | POA: Diagnosis not present

## 2022-09-11 DIAGNOSIS — E669 Obesity, unspecified: Secondary | ICD-10-CM

## 2022-09-11 DIAGNOSIS — E1165 Type 2 diabetes mellitus with hyperglycemia: Secondary | ICD-10-CM

## 2022-09-11 MED ORDER — VITAMIN D (ERGOCALCIFEROL) 1.25 MG (50000 UNIT) PO CAPS: 50000.0000 [IU] | ORAL_CAPSULE | ORAL | 0 refills | Status: DC

## 2022-09-12 LAB — COMPREHENSIVE METABOLIC PANEL
ALT: 15 IU/L (ref 0–32)
AST: 13 IU/L (ref 0–40)
Albumin/Globulin Ratio: 1.7 (ref 1.2–2.2)
Albumin: 4.6 g/dL (ref 3.8–4.9)
Alkaline Phosphatase: 157 IU/L — ABNORMAL HIGH (ref 44–121)
BUN/Creatinine Ratio: 18 (ref 12–28)
BUN: 17 mg/dL (ref 8–27)
Bilirubin Total: 0.4 mg/dL (ref 0.0–1.2)
CO2: 20 mmol/L (ref 20–29)
Calcium: 11.3 mg/dL — ABNORMAL HIGH (ref 8.7–10.3)
Chloride: 101 mmol/L (ref 96–106)
Creatinine, Ser: 0.96 mg/dL (ref 0.57–1.00)
Globulin, Total: 2.7 g/dL (ref 1.5–4.5)
Glucose: 84 mg/dL (ref 70–99)
Potassium: 5.1 mmol/L (ref 3.5–5.2)
Sodium: 138 mmol/L (ref 134–144)
Total Protein: 7.3 g/dL (ref 6.0–8.5)
eGFR: 68 mL/min/{1.73_m2} (ref >59)

## 2022-09-12 LAB — HEMOGLOBIN A1C
Est. average glucose Bld gHb Est-mCnc: 117 mg/dL
Hgb A1c MFr Bld: 5.7 % — ABNORMAL HIGH (ref 4.8–5.6)

## 2022-09-12 LAB — LIPID PANEL WITH LDL/HDL RATIO
Cholesterol, Total: 164 mg/dL (ref 100–199)
HDL: 57 mg/dL (ref >39)
LDL Chol Calc (NIH): 85 mg/dL (ref 0–99)
LDL/HDL Ratio: 1.5 ratio (ref 0.0–3.2)
Triglycerides: 125 mg/dL (ref 0–149)
VLDL Cholesterol Cal: 22 mg/dL (ref 5–40)

## 2022-09-12 LAB — VITAMIN B12: Vitamin B-12: 338 pg/mL (ref 232–1245)

## 2022-09-12 LAB — TSH: TSH: 1.39 u[IU]/mL (ref 0.450–4.500)

## 2022-09-12 LAB — VITAMIN D 25 HYDROXY (VIT D DEFICIENCY, FRACTURES): Vit D, 25-Hydroxy: 37.2 ng/mL (ref 30.0–100.0)

## 2022-09-12 NOTE — Progress Notes (Unsigned)
Chief Complaint:   OBESITY Tracy Graves is here to discuss her progress with her obesity treatment plan along with follow-up of her obesity related diagnoses. Tracy Graves is on the Category 3 Plan and states she is following her eating plan approximately 80% of the time. Tracy Graves states she is exercising 0 minutes 0 times per week.  Today's visit was #: 29 Starting weight: 249 lbs Starting date: 09/12/2020 Today's weight: 246 lbs Today's date: 09/11/2022 Total lbs lost to date: 3 lbs Total lbs lost since last in-office visit: 2  Interim History: Tracy Graves has done well since her last visit. Has been "sticking to it" and exercising more following Cat 4. Denies hunger and cravings. Her highest weight was 334 lbs. Drinking water with fruit for flavoring, and coffee.   Subjective:   1. Type 2 diabetes mellitus with hyperglycemia, without long-term current use of insulin (HCC) Tracy Graves is taking metformin 500 mg daily. Denies any side effects. Fasting blood sugars: 89-110. 2 hours pp. Lunch: 100, 2 hours pp, supper is 95-105. Denies hypoglycemia. On a statin. Last eye exam, Oct 2022.  2. Hyperlipidemia associated with type 2 diabetes mellitus (HCC) Tracy Graves is taking Lipitor 10 mg. Denies any side effects.  3. Vitamin D deficiency Tracy Graves is currently taking prescription Vit D 50,000 IU once a week. Denies any nausea, vomiting or muscle weakness.  4. Hypothyroidism, acquired Tracy Graves is taking Synthroid 125 mcg. Denies any side effects.  5. Medication management We will obtain labs today.  Assessment/Plan:   1. Type 2 diabetes mellitus with hyperglycemia, without long-term current use of insulin (HCC) We will obtain labs today. Continue to follow up with PCP and continue medications as directed.  - Hemoglobin A1c   2. Hyperlipidemia associated with type 2 diabetes mellitus (HCC) We will obtain labs today. Cardiovascular risk and specific lipid/LDL goals reviewed.  We discussed several lifestyle  modifications today and Kimberlynn will continue to work on diet, exercise and weight loss efforts. Orders and follow up as documented in patient record.   Counseling Intensive lifestyle modifications are the first line treatment for this issue. Dietary changes: Increase soluble fiber. Decrease simple carbohydrates. Exercise changes: Moderate to vigorous-intensity aerobic activity 150 minutes per week if tolerated. Lipid-lowering medications: see documented in medical record.   - Comprehensive metabolic panel - Lipid Panel With LDL/HDL Ratio  3. Vitamin D deficiency We will obtain labs today. We will refill Vit D 50k IU once weekly for 1 month with 0 refills.  -Refill Vitamin D, Ergocalciferol, (DRISDOL) 1.25 MG (50000 UNIT) CAPS capsule; Take 1 capsule (50,000 Units total) by mouth every 7 (seven) days.  Dispense: 4 capsule; Refill: 0  - VITAMIN D 25 Hydroxy (Vit-D Deficiency, Fractures)  4. Hypothyroidism, acquired We will obtain labs today.  Continue to follow up with PCP and continue medication as directed.  - Comprehensive metabolic panel - TSH  5. Medication management We will obtain labs today.  - Vitamin B12 - TSH  6. Obesity with current BMI of 43.7 Tracy Graves is currently in the action stage of change. As such, her goal is to continue with weight loss efforts. She has agreed to the Category 3 Plan.   Exercise goals: All adults should avoid inactivity. Some physical activity is better than none, and adults who participate in any amount of physical activity gain some health benefits.  Behavioral modification strategies: increasing lean protein intake, increasing vegetables, and no skipping meals.  Tracy Graves has agreed to follow-up with our clinic in 4 weeks.  She was informed of the importance of frequent follow-up visits to maximize her success with intensive lifestyle modifications for her multiple health conditions.   Tracy Graves was informed we would discuss her lab results at her next  visit unless there is a critical issue that needs to be addressed sooner. Tracy Graves agreed to keep her next visit at the agreed upon time to discuss these results.  Objective:   Blood pressure 124/82, pulse 90, temperature 97.9 F (36.6 C), height 5\' 3"  (1.6 m), weight 246 lb (111.6 kg), SpO2 96 %. Body mass index is 43.58 kg/m.  General: Cooperative, alert, well developed, in no acute distress. HEENT: Conjunctivae and lids unremarkable. Cardiovascular: Regular rhythm.  Lungs: Normal work of breathing. Neurologic: No focal deficits.   Lab Results  Component Value Date   CREATININE 0.96 09/11/2022   BUN 17 09/11/2022   NA 138 09/11/2022   K 5.1 09/11/2022   CL 101 09/11/2022   CO2 20 09/11/2022   Lab Results  Component Value Date   ALT 15 09/11/2022   AST 13 09/11/2022   ALKPHOS 157 (H) 09/11/2022   BILITOT 0.4 09/11/2022   Lab Results  Component Value Date   HGBA1C 5.7 (H) 09/11/2022   HGBA1C 5.5 03/22/2022   HGBA1C 5.6 07/31/2021   HGBA1C 5.5 02/14/2021   HGBA1C 5.6 09/12/2020   Lab Results  Component Value Date   INSULIN 29.2 (H) 03/22/2022   INSULIN 22.8 07/31/2021   INSULIN 16.8 02/14/2021   INSULIN 41.6 (H) 09/12/2020   Lab Results  Component Value Date   TSH 1.390 09/11/2022   Lab Results  Component Value Date   CHOL 164 09/11/2022   HDL 57 09/11/2022   LDLCALC 85 09/11/2022   TRIG 125 09/11/2022   Lab Results  Component Value Date   VD25OH 37.2 09/11/2022   VD25OH 45.3 03/22/2022   VD25OH 37.7 07/31/2021   Lab Results  Component Value Date   WBC 10.0 03/22/2022   HGB 16.7 (H) 03/22/2022   HCT 49.2 (H) 03/22/2022   MCV 93 03/22/2022   PLT 463 (H) 03/22/2022   No results found for: "IRON", "TIBC", "FERRITIN"  Attestation Statements:   Reviewed by clinician on day of visit: allergies, medications, problem list, medical history, surgical history, family history, social history, and previous encounter notes.  I, Brendell Tyus, RMA, am acting  as transcriptionist for 03/24/2022, FNP.  I have reviewed the above documentation for accuracy and completeness, and I agree with the above. Irene Limbo, FNP

## 2022-10-04 ENCOUNTER — Other Ambulatory Visit (INDEPENDENT_AMBULATORY_CARE_PROVIDER_SITE_OTHER): Payer: Self-pay | Admitting: Nurse Practitioner

## 2022-10-04 DIAGNOSIS — E559 Vitamin D deficiency, unspecified: Secondary | ICD-10-CM

## 2022-10-08 ENCOUNTER — Encounter (INDEPENDENT_AMBULATORY_CARE_PROVIDER_SITE_OTHER): Payer: Self-pay | Admitting: Family Medicine

## 2022-10-08 NOTE — Telephone Encounter (Signed)
Last OV with Stephanie ?

## 2022-10-09 ENCOUNTER — Other Ambulatory Visit: Payer: Self-pay | Admitting: Nurse Practitioner

## 2022-10-09 ENCOUNTER — Ambulatory Visit (INDEPENDENT_AMBULATORY_CARE_PROVIDER_SITE_OTHER): Payer: Medicaid Other | Admitting: Family Medicine

## 2022-10-09 DIAGNOSIS — E559 Vitamin D deficiency, unspecified: Secondary | ICD-10-CM

## 2022-10-09 MED ORDER — VITAMIN D (ERGOCALCIFEROL) 1.25 MG (50000 UNIT) PO CAPS: 50000.0000 [IU] | ORAL_CAPSULE | ORAL | 0 refills | Status: DC

## 2022-10-09 NOTE — Telephone Encounter (Signed)
Last OV with Stephanie on 09/11/22  Please advise

## 2022-10-14 NOTE — Progress Notes (Unsigned)
Cardiology Office Note:    Date:  10/15/2022   ID:  Tracy Graves, DOB 09/06/1961, MRN [id number]  PCP:  Patient, No Pcp Per  [facility] Cardiologist:  Christell Constant, MD  [facility] Electrophysiologist:  None   CC:  Prevention  History of Present Illness:    Tracy Graves is a 61 y.o. female with a hx of Morbid Obesity, DM with HTN, Aortic Atherosclerosis, Tobacco Abuse who presented for evaluation 09/21/20.  2021:  Received TTE without evidence of HCM (questionable prior echo) and NM Stress Test without evidence of ischemia. At last visit worked on primary prevention.   2022: In interim of this visit, patient has continued exercise but has felt stagnant in weight loss. Set quit goal, seen prior to tooth surgery.  Patient notes that she is doing OK.   Has new quite date for 10/31/22. Has diet that now increases her animal protein consumption and this has been difficult to her taste. There are no interval hospital/ED visit.    No chest pain or pressure .  No SOB/DOE and no PND/Orthopnea.  No weight gain or leg swelling.  No palpitations or syncope .   Past Medical History:  Diagnosis Date   Abdominal hernia    Abscess    chest wall, right breast   Cellulitis    Diabetes (HCC)    History of kidney stones    Hyperglycemia due to type 2 diabetes mellitus (HCC) 06/05/2015   Hypertension    Hypothyroid    2016   Intertrigo    Morbid obesity due to excess calories (HCC) 06/05/2015   Multiple food allergies    Shortness of breath    Tobacco use     Past Surgical History:  Procedure Laterality Date   IR GENERIC HISTORICAL  12/23/2016   IR NEPHROSTOMY PLACEMENT LEFT 12/23/2016 Berdine Dance, MD [facility]   NEPHROLITHOTOMY Left 01/28/2017   Procedure: LEFT NEPHROLITHOTOMY PERCUTANEOUS;  Surgeon: Crist Fat, MD;  Location: [facility];  Service: Urology;  Laterality: Left;   NEPHROSTOMY TUBE PLACEMENT ([facility] HX)  12/23/2016   Current Medications: Current  Meds  Medication Sig   Albuterol Sulfate (PROVENTIL HFA IN) as needed (sob, wheezing, allergies).    atorvastatin (LIPITOR) 20 MG tablet Take 1 tablet (20 mg total) by mouth daily.   levothyroxine (SYNTHROID) 125 MCG tablet Take 125 mcg by mouth daily before breakfast.    metFORMIN (GLUCOPHAGE) 500 MG tablet Take 500 mg by mouth daily with breakfast.    metoprolol succinate (TOPROL-XL) 50 MG 24 hr tablet Take 50 mg by mouth daily.   polyethylene glycol (MIRALAX / GLYCOLAX) 17 g packet Take 17 g by mouth as needed.   Vitamin D, Ergocalciferol, (DRISDOL) 1.25 MG (50000 UNIT) CAPS capsule Take 1 capsule (50,000 Units total) by mouth every 7 (seven) days.   [DISCONTINUED] atorvastatin (LIPITOR) 10 MG tablet Take 1 tablet (10 mg total) by mouth daily.   Allergies:   Fish allergy and Other   Social History   Socioeconomic History   Marital status: Widowed    Spouse name: Not on file   Number of children: Not on file   Years of education: Not on file   Highest education level: Not on file  Occupational History   Occupation: Stay at Home  Tobacco Use   Smoking status: Some Days    Packs/day: 0.75    Years: 23.00    Total pack years: 17.25    Types: Cigarettes  Last attempt to quit: 10/23/2019    Years since quitting: 2.9   Smokeless tobacco: Never   Tobacco comments:    pt stated smokes every now and then as of 06/09/20  Vaping Use   Vaping Use: Never used  Substance and Sexual Activity   Alcohol use: No   Drug use: No   Sexual activity: Yes    Birth control/protection: None  Other Topics Concern   Not on file  Social History Narrative   Not on file   Social Determinants of Health   Financial Resource Strain: Not on file  Food Insecurity: Not on file  Transportation Needs: Not on file  Physical Activity: Not on file  Stress: Not on file  Social Connections: Not on file    Family History: The patient's family history includes Breast cancer in her mother; Cancer in her  mother; Depression in her father; Diabetes in her father; Diabetes Mellitus II in her father; Heart attack in her father; Leukemia in her maternal grandfather; Obesity in her father and mother; Ovarian cancer in her maternal aunt; Thyroid disease in her father.  SCD in the father (age 94) in the setting of significant tobacco use; Uncle on fathers side had HF, died at 52.  ROS:   Please see the history of present illness.    All other systems reviewed and are negative.  EKGs/Labs/Other Studies Reviewed:    The following studies were reviewed today:  EKG:   10/15/22: NSR 08/14/21: SR rate 86 QTc 423 09/12/20 SR, 86, nonspecific TWI.  Transthoracic Echocardiogram: Date:10/04/20 Results: No evidence of HCM substrate on improved imaging. Normal systolic function 1. Left ventricular ejection fraction, by estimation, is 65 to 70%. The  left ventricle has normal function. The left ventricle has no regional  wall motion abnormalities. There is mild concentric left ventricular  hypertrophy. Left ventricular diastolic  parameters are consistent with Grade I diastolic dysfunction (impaired  relaxation).   2. Right ventricular systolic function is normal. The right ventricular  size is normal.   3. The mitral valve is normal in structure. Mild mitral valve  regurgitation. No evidence of mitral stenosis.   4. The aortic valve is normal in structure. Aortic valve regurgitation is  not visualized. No aortic stenosis is present.   5. The inferior vena cava is normal in size with greater than 50%  respiratory variability, suggesting right atrial pressure of 3 mmHg.   11/03/19: Notable Septal Hypertrophy septal measurement difficult but approximately 17 mm.  No significant LVOT gradient found.   NonCardiac CT: Date:11/03/2019 Results: LAD and LCx Calcium;no interstital lung disease  NM Stress Test 09/28/20 The left ventricular ejection fraction is hyperdynamic (>65%). Nuclear stress EF: 67%. No  T wave inversion was noted during stress. There was no ST segment deviation noted during stress. Defect 1: There is a small defect of mild severity. This is a low risk study.   Small size, mild intensity apical perfusion defect, improved with stress upright imaging, suggestive of attenuation artifact. No significant reversible ischemia. LVEF 67% with normal wall motion. This is a low risk study.   Recent Labs: 03/22/2022: Hemoglobin 16.7; Platelets 463 09/11/2022: ALT 15; BUN 17; Creatinine, Ser 0.96; Potassium 5.1; Sodium 138; TSH 1.390  Recent Lipid Panel    Component Value Date/Time   CHOL 164 09/11/2022 1125   TRIG 125 09/11/2022 1125   HDL 57 09/11/2022 1125   LDLCALC 85 09/11/2022 1125    Physical Exam:    VS:  BP  126/82   Pulse 84   Ht 5\' 2"  (1.575 m)   Wt 251 lb (113.9 kg)   SpO2 97%   BMI 45.91 kg/m     Wt Readings from Last 3 Encounters:  10/15/22 251 lb (113.9 kg)  09/11/22 246 lb (111.6 kg)  08/07/22 248 lb (112.5 kg)    Gen: No distress, Morbid obesity   Neck: No JVD Cardiac: No Rubs or Gallops, no  murmur, RRR +2 radial pulses Respiratory: Clear to auscultation bilaterally, normal effort, normal  respiratory rate GI: Soft, nontender, non-distended  MS: No  edema;  moves all extremities Integument: Skin feels warm Neuro:  At time of evaluation, alert and oriented to person/place/time/situation  Psych: Normal affect, patient feels well   ASSESSMENT:    1. Morbid obesity (HCC)   2. Essential hypertension   3. Mixed hyperlipidemia   4. Tobacco abuse      PLAN:    Aortic Atherosclerosis  Hyperlipidemia (mixed) Morbid Obesity -LDL goal less than 70  - will increase to atorvastatin 20 mg PO daily - labs in three months - we discuss diet; focused on vegan sources of protein and created diet plan for meat swaps that do not increase similar sugars using (10/07/22) as a scaffold  Tobacco Abuse - new quit  date for 10/31/22; discussed weight modulations as a part of her quit plan  Obesity/DM/HTN OSA- seeing pulmonology  - ambulatory blood pressure 120/80 at greatest - continue home medications     One year follow up unless new symptoms or abnormal test results warranting change in plan    Medication Adjustments/Labs and Tests Ordered: Current medicines are reviewed at length with the patient today.  Concerns regarding medicines are outlined above.  Orders Placed This Encounter  Procedures   ALT   Lipid panel   EKG 12-Lead    Meds ordered this encounter  Medications   atorvastatin (LIPITOR) 20 MG tablet    Sig: Take 1 tablet (20 mg total) by mouth daily.    Dispense:  90 tablet    Refill:  3     Patient Instructions  Medication Instructions:  Your physician has recommended you make the following change in your medication:  INCREASE: atorvastatin (Lipitor) to 20 mg by mouth once daily  *If you need a refill on your cardiac medications before your next appointment, please call your pharmacy*   Lab Work: IN 3 MONTHS: FLP, ALT (nothing to eat or drink 8-12 hours prior except water and black coffee)   If you have labs (blood work) drawn today and your tests are completely normal, you will receive your results only by: MyChart Message (if you have MyChart) OR A paper copy in the mail If you have any lab test that is abnormal or we need to change your treatment, we will call you to review the results.   Testing/Procedures: NONE   Follow-Up: At [facility], you and your health needs are our priority.  As part of our continuing mission to provide you with exceptional heart care, we have created designated Provider Care Teams.  These Care Teams include your primary Cardiologist (physician) and Advanced Practice Providers (APPs -  Physician Assistants and Nurse Practitioners) who all work together to provide you with the care you need, when you need it.   Your next  appointment:   1 year(s)  The format for your next appointment:   In Person  Provider:   [facility], MD  Important Information About Sugar         Signed, Werner Lean, MD  10/15/2022 10:01 AM    [facility]

## 2022-10-15 ENCOUNTER — Ambulatory Visit: Payer: Medicaid Other | Attending: Internal Medicine | Admitting: Internal Medicine

## 2022-10-15 ENCOUNTER — Encounter: Payer: Self-pay | Admitting: Internal Medicine

## 2022-10-15 DIAGNOSIS — E119 Type 2 diabetes mellitus without complications: Secondary | ICD-10-CM | POA: Diagnosis not present

## 2022-10-15 DIAGNOSIS — Z720 Tobacco use: Secondary | ICD-10-CM

## 2022-10-15 DIAGNOSIS — E039 Hypothyroidism, unspecified: Secondary | ICD-10-CM | POA: Diagnosis not present

## 2022-10-15 DIAGNOSIS — G4733 Obstructive sleep apnea (adult) (pediatric): Secondary | ICD-10-CM

## 2022-10-15 DIAGNOSIS — E782 Mixed hyperlipidemia: Secondary | ICD-10-CM | POA: Diagnosis not present

## 2022-10-15 DIAGNOSIS — I700 Atherosclerosis of aorta: Secondary | ICD-10-CM

## 2022-10-15 DIAGNOSIS — Z23 Encounter for immunization: Secondary | ICD-10-CM | POA: Diagnosis not present

## 2022-10-15 DIAGNOSIS — I10 Essential (primary) hypertension: Secondary | ICD-10-CM | POA: Diagnosis not present

## 2022-10-15 MED ORDER — ATORVASTATIN CALCIUM 20 MG PO TABS: 20.0000 mg | ORAL_TABLET | Freq: Every day | ORAL | 3 refills | Status: DC

## 2022-10-15 NOTE — Patient Instructions (Signed)
Medication Instructions:  Your physician has recommended you make the following change in your medication:  INCREASE: atorvastatin (Lipitor) to 20 mg by mouth once daily  *If you need a refill on your cardiac medications before your next appointment, please call your pharmacy*   Lab Work: IN 3 MONTHS: FLP, ALT (nothing to eat or drink 8-12 hours prior except water and black coffee)   If you have labs (blood work) drawn today and your tests are completely normal, you will receive your results only by: [place] (if you have MyChart) OR A paper copy in the mail If you have any lab test that is abnormal or we need to change your treatment, we will call you to review the results.   Testing/Procedures: NONE   Follow-Up: At [facility], you and your health needs are our priority.  As part of our continuing mission to provide you with exceptional heart care, we have created designated Provider Care Teams.  These Care Teams include your primary Cardiologist (physician) and Advanced Practice Providers (APPs -  Physician Assistants and Nurse Practitioners) who all work together to provide you with the care you need, when you need it.   Your next appointment:   1 year(s)  The format for your next appointment:   In Person  Provider:   Werner Lean, MD     Important Information About Sugar

## 2022-11-03 ENCOUNTER — Telehealth: Payer: Medicaid Other | Admitting: Nurse Practitioner

## 2022-11-03 DIAGNOSIS — K047 Periapical abscess without sinus: Secondary | ICD-10-CM | POA: Diagnosis not present

## 2022-11-03 MED ORDER — CLINDAMYCIN HCL 300 MG PO CAPS: 300.0000 mg | ORAL_CAPSULE | Freq: Four times a day (QID) | ORAL | 0 refills | Status: DC

## 2022-11-03 NOTE — Progress Notes (Signed)

## 2022-11-05 ENCOUNTER — Ambulatory Visit (INDEPENDENT_AMBULATORY_CARE_PROVIDER_SITE_OTHER): Payer: Medicaid Other | Admitting: Family Medicine

## 2022-11-05 ENCOUNTER — Encounter (INDEPENDENT_AMBULATORY_CARE_PROVIDER_SITE_OTHER): Payer: Self-pay | Admitting: Family Medicine

## 2022-11-05 VITALS — BP 111/74 | HR 76 | Temp 98.0°F | Ht 63.0 in | Wt 247.0 lb

## 2022-11-05 DIAGNOSIS — E669 Obesity, unspecified: Secondary | ICD-10-CM | POA: Diagnosis not present

## 2022-11-05 DIAGNOSIS — Z7984 Long term (current) use of oral hypoglycemic drugs: Secondary | ICD-10-CM

## 2022-11-05 DIAGNOSIS — E1165 Type 2 diabetes mellitus with hyperglycemia: Secondary | ICD-10-CM | POA: Diagnosis not present

## 2022-11-05 DIAGNOSIS — Z720 Tobacco use: Secondary | ICD-10-CM

## 2022-11-05 DIAGNOSIS — E559 Vitamin D deficiency, unspecified: Secondary | ICD-10-CM | POA: Diagnosis not present

## 2022-11-05 DIAGNOSIS — Z6841 Body Mass Index (BMI) 40.0 and over, adult: Secondary | ICD-10-CM | POA: Diagnosis not present

## 2022-11-05 DIAGNOSIS — F17201 Nicotine dependence, unspecified, in remission: Secondary | ICD-10-CM

## 2022-11-05 MED ORDER — VITAMIN D (ERGOCALCIFEROL) 1.25 MG (50000 UNIT) PO CAPS: 50000.0000 [IU] | ORAL_CAPSULE | ORAL | 0 refills | Status: DC

## 2022-11-12 NOTE — Progress Notes (Signed)
Chief Complaint:   OBESITY Tracy Graves is here to discuss her progress with her obesity treatment plan along with follow-up of her obesity related diagnoses. Tracy Graves is on the Category 3 Plan and states she is following her eating plan approximately 100% of the time. Tracy Graves states she is walking/resistance training/water aerobics 30 minutes 2-7 times per week.  Today's visit was #: 30 Starting weight: 249 lbs Starting date: 09/12/2020 Today's weight: 247 lbs Today's date: 11/05/2022 Total lbs lost to date: 0 lbs Total lbs lost since last in-office visit: 0  Interim History: Tracy Graves is having some family stress and dealing with cats feuding at home. Getting a rash from eating eggs in the am. Interested in different options for breakfast. Lunch and dinner are easier to follow. She is getting all food in at all 3 meals.  Subjective:   1. Vitamin D deficiency Tracy Graves is currently taking prescription Vit D 50,000 IU once a week. Denies any nausea, vomiting or muscle weakness. She notes fatigue.  2. Type 2 diabetes mellitus with hyperglycemia, without long-term current use of insulin (HCC) Tracy Graves is on Metformin. Denies GI side effects. Her last A1c was 5.7.  3. Tobacco abuse Tracy Graves's last cigarette was on 10/31. She is feeling confident she can continue.  Assessment/Plan:   1. Vitamin D deficiency We will refill Vit D 50k IU once a week for 1 month with 0 refills.  -Refill Vitamin D, Ergocalciferol, (DRISDOL) 1.25 MG (50000 UNIT) CAPS capsule; Take 1 capsule (50,000 Units total) by mouth every 7 (seven) days.  Dispense: 4 capsule; Refill: 0  2. Type 2 diabetes mellitus with hyperglycemia, without long-term current use of insulin (HCC) Continue Metformin without any changes in medication or dose.  3. Tobacco abuse Follow up on cessation at next appointment.  4. Obesity with current BMI of 43.8 Tracy Graves is currently in the action stage of change. As such, her goal is to continue with weight  loss efforts. She has agreed to keeping a food journal and adhering to recommended goals of 250-400 calories and 25+ grams of protein at breakfast.   Exercise goals: As is.  Behavioral modification strategies: increasing lean protein intake, meal planning and cooking strategies, keeping healthy foods in the home, and planning for success.  Tracy Graves has agreed to follow-up with our clinic in 5 weeks. She was informed of the importance of frequent follow-up visits to maximize her success with intensive lifestyle modifications for her multiple health conditions.   Objective:   Blood pressure 111/74, pulse 76, temperature 98 F (36.7 C), height 5\' 3"  (1.6 m), weight 247 lb (112 kg), SpO2 98 %. Body mass index is 43.75 kg/m.  General: Cooperative, alert, well developed, in no acute distress. HEENT: Conjunctivae and lids unremarkable. Cardiovascular: Regular rhythm.  Lungs: Normal work of breathing. Neurologic: No focal deficits.   Lab Results  Component Value Date   CREATININE 0.96 09/11/2022   BUN 17 09/11/2022   NA 138 09/11/2022   K 5.1 09/11/2022   CL 101 09/11/2022   CO2 20 09/11/2022   Lab Results  Component Value Date   ALT 15 09/11/2022   AST 13 09/11/2022   ALKPHOS 157 (H) 09/11/2022   BILITOT 0.4 09/11/2022   Lab Results  Component Value Date   HGBA1C 5.7 (H) 09/11/2022   HGBA1C 5.5 03/22/2022   HGBA1C 5.6 07/31/2021   HGBA1C 5.5 02/14/2021   HGBA1C 5.6 09/12/2020   Lab Results  Component Value Date   INSULIN 29.2 (H)  03/22/2022   INSULIN 22.8 07/31/2021   INSULIN 16.8 02/14/2021   INSULIN 41.6 (H) 09/12/2020   Lab Results  Component Value Date   TSH 1.390 09/11/2022   Lab Results  Component Value Date   CHOL 164 09/11/2022   HDL 57 09/11/2022   LDLCALC 85 09/11/2022   TRIG 125 09/11/2022   Lab Results  Component Value Date   VD25OH 37.2 09/11/2022   VD25OH 45.3 03/22/2022   VD25OH 37.7 07/31/2021   Lab Results  Component Value Date   WBC 10.0  03/22/2022   HGB 16.7 (H) 03/22/2022   HCT 49.2 (H) 03/22/2022   MCV 93 03/22/2022   PLT 463 (H) 03/22/2022   No results found for: "IRON", "TIBC", "FERRITIN"  Attestation Statements:   Reviewed by clinician on day of visit: allergies, medications, problem list, medical history, surgical history, family history, social history, and previous encounter notes.  I, Fortino Sic, RMA am acting as transcriptionist for [name], MD.  I have reviewed the above documentation for accuracy and completeness, and I agree with the above. - [name], MD

## 2022-12-10 ENCOUNTER — Encounter (INDEPENDENT_AMBULATORY_CARE_PROVIDER_SITE_OTHER): Payer: Self-pay | Admitting: Family Medicine

## 2022-12-10 ENCOUNTER — Ambulatory Visit (INDEPENDENT_AMBULATORY_CARE_PROVIDER_SITE_OTHER): Payer: Medicaid Other | Admitting: Family Medicine

## 2022-12-10 VITALS — BP 115/76 | HR 81 | Temp 98.3°F | Ht 63.0 in | Wt 245.0 lb

## 2022-12-10 DIAGNOSIS — E669 Obesity, unspecified: Secondary | ICD-10-CM

## 2022-12-10 DIAGNOSIS — E1165 Type 2 diabetes mellitus with hyperglycemia: Secondary | ICD-10-CM | POA: Diagnosis not present

## 2022-12-10 DIAGNOSIS — Z6841 Body Mass Index (BMI) 40.0 and over, adult: Secondary | ICD-10-CM

## 2022-12-10 DIAGNOSIS — E559 Vitamin D deficiency, unspecified: Secondary | ICD-10-CM | POA: Diagnosis not present

## 2022-12-10 DIAGNOSIS — Z7984 Long term (current) use of oral hypoglycemic drugs: Secondary | ICD-10-CM | POA: Diagnosis not present

## 2022-12-10 MED ORDER — VITAMIN D (ERGOCALCIFEROL) 1.25 MG (50000 UNIT) PO CAPS: 50000.0000 [IU] | ORAL_CAPSULE | ORAL | 0 refills | Status: DC

## 2022-12-29 NOTE — Progress Notes (Signed)
Chief Complaint:   OBESITY Tracy Graves is here to discuss her progress with her obesity treatment plan along with follow-up of her obesity related diagnoses. [name] is on the Category 3 Plan and keeping a food journal and adhering to recommended goals of 250-400 calories and 25+ grams protein with breakfast and states she is following her eating plan approximately 75% of the time. [name] states she is walking 60 minutes 7 times per week.  Today's visit was #: 31 Starting weight: 249 lbs Starting date: 09/12/2020 Today's weight: 245 lbs Today's date: 12/10/2022 Total lbs lost to date: 4 Total lbs lost since last in-office visit: 2  Interim History: Pt found out after her last appt that her sister died. She feels she has handled the situation as well as she could. She has been following plan 3. Lots of days, pt isn't eating a lot due to lack of hunger and feeling ill if she did. Her [birth year] is next weekend.  Subjective:   1. Vitamin D deficiency [name] denies nausea, vomiting, and muscle weakness but notes fatigue. She is on prescription Vit D. Pt's Vit D level was 37.2 in Sept.  2. Type 2 diabetes mellitus with hyperglycemia, without long-term current use of insulin (HCC) She is on Metformin with no GI side effects. Pt's last A1c was 5.7.  Assessment/Plan:   1. Vitamin D deficiency Low Vitamin D level contributes to fatigue and are associated with obesity, breast, and colon cancer. She agrees to continue to take prescription Vitamin D 50,000 IU every week and will follow-up for routine testing of Vitamin D, at least 2-3 times per year to avoid over-replacement.  Refill- Vitamin D, Ergocalciferol, (DRISDOL) 1.25 MG (50000 UNIT) CAPS capsule; Take 1 capsule (50,000 Units total) by mouth every 7 (seven) days.  Dispense: 4 capsule; Refill: 0  2. Type 2 diabetes mellitus with hyperglycemia, without long-term current use of insulin (HCC) Good blood sugar control is important to decrease the  likelihood of diabetic complications such as nephropathy, neuropathy, limb loss, blindness, coronary artery disease, and death. Intensive lifestyle modification including diet, exercise and weight loss are the first line of treatment for diabetes.  Continue current meds and repeat labs in January.  3. Obesity with current BMI of 43.4 [name] is currently in the action stage of change. As such, her goal is to continue with weight loss efforts. She has agreed to the Category 3 Plan and keeping a food journal and adhering to recommended goals of 250-400 calories and 25+ grams protein with breakfast.   Exercise goals: All adults should avoid inactivity. Some physical activity is better than none, and adults who participate in any amount of physical activity gain some health benefits.  Behavioral modification strategies: increasing lean protein intake, meal planning and cooking strategies, keeping healthy foods in the home, and planning for success.  Laury has agreed to follow-up with our clinic in 4-5 weeks, fasting for labs. She was informed of the importance of frequent follow-up visits to maximize her success with intensive lifestyle modifications for her multiple health conditions.   Objective:   Blood pressure 115/76, pulse 81, temperature 98.3 F (36.8 C), height 5\' 3"  (1.6 m), weight 245 lb (111.1 kg), SpO2 96 %. Body mass index is 43.4 kg/m.  General: Cooperative, alert, well developed, in no acute distress. HEENT: Conjunctivae and lids unremarkable. Cardiovascular: Regular rhythm.  Lungs: Normal work of breathing. Neurologic: No focal deficits.   Lab Results  Component Value Date   CREATININE  0.96 09/11/2022   BUN 17 09/11/2022   NA 138 09/11/2022   K 5.1 09/11/2022   CL 101 09/11/2022   CO2 20 09/11/2022   Lab Results  Component Value Date   ALT 15 09/11/2022   AST 13 09/11/2022   ALKPHOS 157 (H) 09/11/2022   BILITOT 0.4 09/11/2022   Lab Results  Component Value Date    HGBA1C 5.7 (H) 09/11/2022   HGBA1C 5.5 03/22/2022   HGBA1C 5.6 07/31/2021   HGBA1C 5.5 02/14/2021   HGBA1C 5.6 09/12/2020   Lab Results  Component Value Date   INSULIN 29.2 (H) 03/22/2022   INSULIN 22.8 07/31/2021   INSULIN 16.8 02/14/2021   INSULIN 41.6 (H) 09/12/2020   Lab Results  Component Value Date   TSH 1.390 09/11/2022   Lab Results  Component Value Date   CHOL 164 09/11/2022   HDL 57 09/11/2022   LDLCALC 85 09/11/2022   TRIG 125 09/11/2022   Lab Results  Component Value Date   VD25OH 37.2 09/11/2022   VD25OH 45.3 03/22/2022   VD25OH 37.7 07/31/2021   Lab Results  Component Value Date   WBC 10.0 03/22/2022   HGB 16.7 (H) 03/22/2022   HCT 49.2 (H) 03/22/2022   MCV 93 03/22/2022   PLT 463 (H) 03/22/2022   Attestation Statements:   Reviewed by clinician on day of visit: allergies, medications, problem list, medical history, surgical history, family history, social history, and previous encounter notes.  I, Kyung Rudd, BS, CMA, am acting as transcriptionist for [name], MD.   I have reviewed the above documentation for accuracy and completeness, and I agree with the above. - [name], MD

## 2023-01-04 ENCOUNTER — Ambulatory Visit: Payer: Medicaid Other | Attending: Internal Medicine

## 2023-01-04 DIAGNOSIS — E782 Mixed hyperlipidemia: Secondary | ICD-10-CM

## 2023-01-04 LAB — LIPID PANEL
Chol/HDL Ratio: 2.2 ratio (ref 0.0–4.4)
Cholesterol, Total: 120 mg/dL (ref 100–199)
HDL: 55 mg/dL (ref >39)
LDL Chol Calc (NIH): 45 mg/dL (ref 0–99)
Triglycerides: 107 mg/dL (ref 0–149)
VLDL Cholesterol Cal: 20 mg/dL (ref 5–40)

## 2023-01-04 LAB — ALT: ALT: 15 IU/L (ref 0–32)

## 2023-01-10 ENCOUNTER — Ambulatory Visit (INDEPENDENT_AMBULATORY_CARE_PROVIDER_SITE_OTHER): Payer: Medicaid Other | Admitting: Family Medicine

## 2023-01-21 ENCOUNTER — Encounter (INDEPENDENT_AMBULATORY_CARE_PROVIDER_SITE_OTHER): Payer: Self-pay | Admitting: Family Medicine

## 2023-01-21 ENCOUNTER — Ambulatory Visit (INDEPENDENT_AMBULATORY_CARE_PROVIDER_SITE_OTHER): Payer: Medicaid Other | Admitting: Family Medicine

## 2023-01-21 VITALS — BP 129/81 | HR 81 | Temp 97.7°F | Ht 63.0 in | Wt 242.0 lb

## 2023-01-21 DIAGNOSIS — E1169 Type 2 diabetes mellitus with other specified complication: Secondary | ICD-10-CM | POA: Diagnosis not present

## 2023-01-21 DIAGNOSIS — Z7984 Long term (current) use of oral hypoglycemic drugs: Secondary | ICD-10-CM | POA: Diagnosis not present

## 2023-01-21 DIAGNOSIS — D751 Secondary polycythemia: Secondary | ICD-10-CM

## 2023-01-21 DIAGNOSIS — E1165 Type 2 diabetes mellitus with hyperglycemia: Secondary | ICD-10-CM

## 2023-01-21 DIAGNOSIS — E559 Vitamin D deficiency, unspecified: Secondary | ICD-10-CM | POA: Diagnosis not present

## 2023-01-21 DIAGNOSIS — Z6841 Body Mass Index (BMI) 40.0 and over, adult: Secondary | ICD-10-CM | POA: Diagnosis not present

## 2023-01-21 DIAGNOSIS — E785 Hyperlipidemia, unspecified: Secondary | ICD-10-CM | POA: Diagnosis not present

## 2023-01-21 DIAGNOSIS — E669 Obesity, unspecified: Secondary | ICD-10-CM | POA: Diagnosis not present

## 2023-01-21 DIAGNOSIS — E039 Hypothyroidism, unspecified: Secondary | ICD-10-CM

## 2023-01-21 DIAGNOSIS — E66813 Obesity, class 3: Secondary | ICD-10-CM

## 2023-01-21 MED ORDER — ATORVASTATIN CALCIUM 10 MG PO TABS: 10.0000 mg | ORAL_TABLET | Freq: Every day | ORAL | 3 refills | Status: DC

## 2023-01-21 MED ORDER — VITAMIN D (ERGOCALCIFEROL) 1.25 MG (50000 UNIT) PO CAPS: 50000.0000 [IU] | ORAL_CAPSULE | ORAL | 0 refills | Status: DC

## 2023-01-22 LAB — COMPREHENSIVE METABOLIC PANEL
ALT: 17 IU/L (ref 0–32)
AST: 12 IU/L (ref 0–40)
Albumin/Globulin Ratio: 1.7 (ref 1.2–2.2)
Albumin: 4.3 g/dL (ref 3.9–4.9)
Alkaline Phosphatase: 140 IU/L — ABNORMAL HIGH (ref 44–121)
BUN/Creatinine Ratio: 13 (ref 12–28)
BUN: 10 mg/dL (ref 8–27)
Bilirubin Total: 0.4 mg/dL (ref 0.0–1.2)
CO2: 21 mmol/L (ref 20–29)
Calcium: 11 mg/dL — ABNORMAL HIGH (ref 8.7–10.3)
Chloride: 103 mmol/L (ref 96–106)
Creatinine, Ser: 0.77 mg/dL (ref 0.57–1.00)
Globulin, Total: 2.6 g/dL (ref 1.5–4.5)
Glucose: 107 mg/dL — ABNORMAL HIGH (ref 70–99)
Potassium: 5.3 mmol/L — ABNORMAL HIGH (ref 3.5–5.2)
Sodium: 140 mmol/L (ref 134–144)
Total Protein: 6.9 g/dL (ref 6.0–8.5)
eGFR: 88 mL/min/{1.73_m2} (ref >59)

## 2023-01-22 LAB — HEMOGLOBIN A1C
Est. average glucose Bld gHb Est-mCnc: 117 mg/dL
Hgb A1c MFr Bld: 5.7 % — ABNORMAL HIGH (ref 4.8–5.6)

## 2023-01-22 LAB — VITAMIN D 25 HYDROXY (VIT D DEFICIENCY, FRACTURES): Vit D, 25-Hydroxy: 35.8 ng/mL (ref 30.0–100.0)

## 2023-01-22 LAB — INSULIN, RANDOM: INSULIN: 20 u[IU]/mL (ref 2.6–24.9)

## 2023-01-30 NOTE — Progress Notes (Signed)
Chief Complaint:   OBESITY Tracy Graves is here to discuss her progress with her obesity treatment plan along with follow-up of her obesity related diagnoses. Tracy Graves is on the Category 3 Plan and keeping a food journal and adhering to recommended goals of 250-400 calories and 25+ grams of protein and states she is following her eating plan approximately 100% of the time. Tracy Graves states she is walking/resistance 60 minutes 6 times per week.  Today's visit was #: 85 Starting weight: 249 lbs Starting date: 09/12/2020 Today's weight: 242 lbs Today's date: 01/21/2023 Total lbs lost to date: 6 lbs Total lbs lost since last in-office visit: 3  Interim History: Tracy Graves had a quite laidback Christmas and New Year's.  Over the holidays followed plan at 100%.  She did not feel hungry or like she was being deprived.  Has a storage unit to clean out in the next few months.  Subjective:   1. Hyperlipidemia associated with type 2 diabetes mellitus ([place]) Tracy Graves is on Lipitor 20 mg but she is having significant side effects of leg cramps/dizziness.  2. Type 2 diabetes mellitus with hyperglycemia, without long-term current use of insulin ([place]) Tracy Graves's last A1c back on pre diabetic range.  She is on metformin daily.  3. Vitamin D deficiency Tracy Graves is currently taking prescription Vit D 50,000 IU once a week.  Last level below goal.  Denies any nausea, vomiting or muscle weakness.  4. Erythrocytosis Tracy Graves's RBC elevation.  She is smoking very very sporadically-4 cigarettes total since October 31.  5. Hypothyroidism, acquired Last TSH within normal limits.  On levothyroxine 125 mcg daily.  Denies any chest pain,shortness of breath or palpitations.  Assessment/Plan:   1. Hyperlipidemia associated with type 2 diabetes mellitus ([place]) We will obtain labs today.  Will refill/decrease Lipitor 10 mg daily for 3 months with 0 refills.  -Refill/decrease atorvastatin (LIPITOR) 10 MG tablet; Take 1 tablet (10 mg total)  by mouth daily.  Dispense: 90 tablet; Refill: 3  - Comprehensive metabolic panel  2. Type 2 diabetes mellitus with hyperglycemia, without long-term current use of insulin (HCC) We will obtain labs today.  - Hemoglobin A1c - Insulin, random  3. Vitamin D deficiency We will obtain labs today.  We will refill Vit D 50K IU once a week for 1 month with 0 refills.  -Refill Vitamin D, Ergocalciferol, (DRISDOL) 1.25 MG (50000 UNIT) CAPS capsule; Take 1 capsule (50,000 Units total) by mouth every 7 (seven) days.  Dispense: 4 capsule; Refill: 0  - VITAMIN D 25 Hydroxy (Vit-D Deficiency, Fractures)  4. Erythrocytosis Retest labs in 3 months.  5. Hypothyroidism, acquired We will obtain labs today.  - TSH  6. Obesity with current BMI of 42.9 Tracy Graves is currently in the action stage of change. As such, her goal is to continue with weight loss efforts. She has agreed to the Category 3 Plan and keeping a food journal and adhering to recommended goals of 250-400 calories and 25+ grams of protein for breakfast.   Exercise goals: All adults should avoid inactivity. Some physical activity is better than none, and adults who participate in any amount of physical activity gain some health benefits.  Behavioral modification strategies: increasing lean protein intake, meal planning and cooking strategies, and keeping healthy foods in the home.  Tracy Graves has agreed to follow-up with our clinic in 6 weeks. She was informed of the importance of frequent follow-up visits to maximize her success with intensive lifestyle modifications for her multiple health conditions.  Tracy Graves was informed we would discuss her lab results at her next visit unless there is a critical issue that needs to be addressed sooner. Tracy Graves agreed to keep her next visit at the agreed upon time to discuss these results.  Objective:   Blood pressure 129/81, pulse 81, temperature 97.7 F (36.5 C), height 5\' 3"  (1.6 m), weight 242 lb (109.8  kg), SpO2 96 %. Body mass index is 42.87 kg/m.  General: Cooperative, alert, well developed, in no acute distress. HEENT: Conjunctivae and lids unremarkable. Cardiovascular: Regular rhythm.  Lungs: Normal work of breathing. Neurologic: No focal deficits.   Lab Results  Component Value Date   CREATININE 0.77 01/21/2023   BUN 10 01/21/2023   NA 140 01/21/2023   K 5.3 (H) 01/21/2023   CL 103 01/21/2023   CO2 21 01/21/2023   Lab Results  Component Value Date   ALT 17 01/21/2023   AST 12 01/21/2023   ALKPHOS 140 (H) 01/21/2023   BILITOT 0.4 01/21/2023   Lab Results  Component Value Date   HGBA1C 5.7 (H) 01/21/2023   HGBA1C 5.7 (H) 09/11/2022   HGBA1C 5.5 03/22/2022   HGBA1C 5.6 07/31/2021   HGBA1C 5.5 02/14/2021   Lab Results  Component Value Date   INSULIN 20.0 01/21/2023   INSULIN 29.2 (H) 03/22/2022   INSULIN 22.8 07/31/2021   INSULIN 16.8 02/14/2021   INSULIN 41.6 (H) 09/12/2020   Lab Results  Component Value Date   TSH 1.390 09/11/2022   Lab Results  Component Value Date   CHOL 120 01/04/2023   HDL 55 01/04/2023   LDLCALC 45 01/04/2023   TRIG 107 01/04/2023   CHOLHDL 2.2 01/04/2023   Lab Results  Component Value Date   VD25OH 35.8 01/21/2023   VD25OH 37.2 09/11/2022   VD25OH 45.3 03/22/2022   Lab Results  Component Value Date   WBC 10.0 03/22/2022   HGB 16.7 (H) 03/22/2022   HCT 49.2 (H) 03/22/2022   MCV 93 03/22/2022   PLT 463 (H) 03/22/2022   No results found for: "IRON", "TIBC", "FERRITIN"  Attestation Statements:   Reviewed by clinician on day of visit: allergies, medications, problem list, medical history, surgical history, family history, social history, and previous encounter notes.  I, Elnora Morrison, RMA am acting as transcriptionist for Coralie Common, MD.  I have reviewed the above documentation for accuracy and completeness, and I agree with the above. - Coralie Common, MD

## 2023-02-25 ENCOUNTER — Encounter (INDEPENDENT_AMBULATORY_CARE_PROVIDER_SITE_OTHER): Payer: Self-pay | Admitting: Family Medicine

## 2023-02-25 ENCOUNTER — Ambulatory Visit (INDEPENDENT_AMBULATORY_CARE_PROVIDER_SITE_OTHER): Payer: Medicaid Other | Admitting: Family Medicine

## 2023-02-25 VITALS — BP 115/79 | HR 81 | Temp 97.7°F | Ht 63.0 in | Wt 241.0 lb

## 2023-02-25 DIAGNOSIS — E669 Obesity, unspecified: Secondary | ICD-10-CM

## 2023-02-25 DIAGNOSIS — E559 Vitamin D deficiency, unspecified: Secondary | ICD-10-CM

## 2023-02-25 DIAGNOSIS — E1165 Type 2 diabetes mellitus with hyperglycemia: Secondary | ICD-10-CM

## 2023-02-25 DIAGNOSIS — Z7984 Long term (current) use of oral hypoglycemic drugs: Secondary | ICD-10-CM

## 2023-02-25 DIAGNOSIS — Z6841 Body Mass Index (BMI) 40.0 and over, adult: Secondary | ICD-10-CM | POA: Diagnosis not present

## 2023-02-25 MED ORDER — VITAMIN D (ERGOCALCIFEROL) 1.25 MG (50000 UNIT) PO CAPS: 50000.0000 [IU] | ORAL_CAPSULE | ORAL | 0 refills | Status: DC

## 2023-02-25 NOTE — Progress Notes (Deleted)
Since last appointment she has been buying concert tickets, playing video games and working out in the yard.  She has been trying to get the yard ready for spring.   She is doing small bed and pot gardening.  Has been trying to stay focused on Cat 3 meal plan.  She has switch protein at lunch to vegetable protein for lunch.  She will substitute quinoa or veggie burger for lunch (black bean).  Ultimately looking for non meat options. Wondering what she can add to a meal in order to hit protein without meat options.  Going to see Lorenza Burton this weekend at [facility]

## 2023-03-11 NOTE — Progress Notes (Unsigned)
Chief Complaint:   OBESITY Tracy Graves is here to discuss her progress with her obesity treatment plan along with follow-up of her obesity related diagnoses. Tracy Graves is on the Category Graves Plan and keeping a food journal and adhering to recommended goals of 250-400 calories and 25 protein and states she is following her eating plan approximately 100% of the time. Tracy Graves states she is exercising 90 minutes 4 times per week.  Today's visit was #: 81 Starting weight: 249 LBS Starting date: 09/12/2020 Today's weight: 241 LBS Today's date: 02/25/2023 Total lbs lost to date: 8 LBS Total lbs lost since last in-office visit: 1 LB  Interim History:  Since last appointment she has been buying concert tickets, playing video games and working out in the yard.  She has been trying to get the yard ready for spring.   She is doing small bed and pot gardening.  Has been trying to stay focused on Tracy Graves meal plan.  She has switch protein at lunch to vegetable protein for lunch.  She will substitute quinoa or veggie burger for lunch (black bean).  Ultimately looking for non meat options. Wondering what she can add to a meal in order to hit protein without meat options.  Going to see Tracy Graves this weekend at [place]:   1. Vitamin D deficiency Patient is on prescription vitamin D.  Patient denies nausea, vomiting, muscle weakness but is positive for fatigue.  2. Type 2 diabetes mellitus with hyperglycemia, without long-term current use of insulin ([place]) Patient is on metformin with no GI side effects.  Assessment/Plan:   1. Vitamin D deficiency Refill- Vitamin D, Ergocalciferol, (DRISDOL) 1.25 MG (50000 UNIT) CAPS capsule; Take 1 capsule (50,000 Units total) by mouth every 7 (seven) days.  Dispense: 4 capsule; Refill: 0  2. Type 2 diabetes mellitus with hyperglycemia, without long-term current use of insulin (HCC) Continue metformin, no refill needed today.  Graves. BMI 40.0-44.9, adult  ([place])  4. Obesity with starting BMI of 44.1 Kema is currently in the action stage of change. As such, her goal is to continue with weight loss efforts. She has agreed to the Category Graves Plan and keeping a food journal and adhering to recommended goals of 350-450 calories and 30+ protein at lunch.  Exercise goals:  As is.  Behavioral modification strategies: increasing lean protein intake, meal planning and cooking strategies, keeping healthy foods in the home, and planning for success.  Mayrene has agreed to follow-up with our clinic in 5 weeks. She was informed of the importance of frequent follow-up visits to maximize her success with intensive lifestyle modifications for her multiple health conditions.   Objective:   Blood pressure 115/79, pulse 81, temperature 97.7 F (36.5 C), height '5\' Graves"'$  (1.6 m), weight 241 lb (109.Graves kg), SpO2 96 %. Body mass index is 42.69 kg/m.  General: Cooperative, alert, well developed, in no acute distress. HEENT: Conjunctivae and lids unremarkable. Cardiovascular: Regular rhythm.  Lungs: Normal work of breathing. Neurologic: No focal deficits.   Lab Results  Component Value Date   CREATININE 0.77 01/21/2023   BUN 10 01/21/2023   NA 140 01/21/2023   K 5.Graves (H) 01/21/2023   CL 103 01/21/2023   CO2 21 01/21/2023   Lab Results  Component Value Date   ALT 17 01/21/2023   AST 12 01/21/2023   ALKPHOS 140 (H) 01/21/2023   BILITOT 0.4 01/21/2023   Lab Results  Component Value Date  HGBA1C 5.7 (H) 01/21/2023   HGBA1C 5.7 (H) 09/11/2022   HGBA1C 5.5 03/22/2022   HGBA1C 5.6 07/31/2021   HGBA1C 5.5 02/14/2021   Lab Results  Component Value Date   INSULIN 20.0 01/21/2023   INSULIN 29.2 (H) 03/22/2022   INSULIN 22.8 07/31/2021   INSULIN 16.8 02/14/2021   INSULIN 41.6 (H) 09/12/2020   Lab Results  Component Value Date   TSH 1.390 09/11/2022   Lab Results  Component Value Date   CHOL 120 01/04/2023   HDL 55 01/04/2023   LDLCALC 45 01/04/2023    TRIG 107 01/04/2023   CHOLHDL 2.2 01/04/2023   Lab Results  Component Value Date   VD25OH 35.8 01/21/2023   VD25OH 37.2 09/11/2022   VD25OH 45.Graves 03/22/2022   Lab Results  Component Value Date   WBC 10.0 03/22/2022   HGB 16.7 (H) 03/22/2022   HCT 49.2 (H) 03/22/2022   MCV 93 03/22/2022   PLT 463 (H) 03/22/2022   No results found for: "IRON", "TIBC", "FERRITIN"  Attestation Statements:   Reviewed by clinician on day of visit: allergies, medications, problem list, medical history, surgical history, family history, social history, and previous encounter notes.  I, Davy Pique, RMA, am acting as transcriptionist for Coralie Common, MD.   I have reviewed the above documentation for accuracy and completeness, and I agree with the above. - Coralie Common, MD

## 2023-04-08 ENCOUNTER — Encounter (INDEPENDENT_AMBULATORY_CARE_PROVIDER_SITE_OTHER): Payer: Self-pay | Admitting: Family Medicine

## 2023-04-08 ENCOUNTER — Ambulatory Visit (INDEPENDENT_AMBULATORY_CARE_PROVIDER_SITE_OTHER): Payer: Medicaid Other | Admitting: Family Medicine

## 2023-04-08 VITALS — BP 112/75 | HR 82 | Temp 98.4°F | Ht 63.0 in | Wt 240.0 lb

## 2023-04-08 DIAGNOSIS — Z7984 Long term (current) use of oral hypoglycemic drugs: Secondary | ICD-10-CM

## 2023-04-08 DIAGNOSIS — E1165 Type 2 diabetes mellitus with hyperglycemia: Secondary | ICD-10-CM | POA: Diagnosis not present

## 2023-04-08 DIAGNOSIS — Z6841 Body Mass Index (BMI) 40.0 and over, adult: Secondary | ICD-10-CM

## 2023-04-08 DIAGNOSIS — E785 Hyperlipidemia, unspecified: Secondary | ICD-10-CM

## 2023-04-08 DIAGNOSIS — E669 Obesity, unspecified: Secondary | ICD-10-CM | POA: Diagnosis not present

## 2023-04-08 DIAGNOSIS — E559 Vitamin D deficiency, unspecified: Secondary | ICD-10-CM | POA: Diagnosis not present

## 2023-04-08 DIAGNOSIS — E1169 Type 2 diabetes mellitus with other specified complication: Secondary | ICD-10-CM

## 2023-04-08 MED ORDER — VITAMIN D (ERGOCALCIFEROL) 1.25 MG (50000 UNIT) PO CAPS: 50000.0000 [IU] | ORAL_CAPSULE | ORAL | 0 refills | Status: DC

## 2023-04-08 MED ORDER — OZEMPIC (0.25 OR 0.5 MG/DOSE) 2 MG/3ML ~~LOC~~ SOPN: 0.2500 mg | PEN_INJECTOR | SUBCUTANEOUS | 0 refills | Status: DC

## 2023-04-08 NOTE — Progress Notes (Unsigned)
Chief Complaint:   OBESITY Tracy Graves is here to discuss her progress with her obesity treatment plan along with follow-up of her obesity related diagnoses. Tracy Graves is on the Category 3 Plan and keeping a food journal and adhering to recommended goals of 350-450 calories and 30+ protein and states she is following her eating plan approximately 100% of the time. Tracy Graves states she is walking 1 mile 6 times per week.  Today's visit was #: 34 Starting weight: 249 LBS Starting date: 09/12/2020 Today's weight: 240 LBS Today's date: 04/08/2023 Total lbs lost to date: 9 BS Total lbs lost since last in-office visit: 1 LB  Interim History: Patient has been doing quite a bit outside over the last few weeks.  She went to see Byrd Hesselbach at [facility] in [place].  She has been following meal plan fairly strictly and felt she had lost at least 5lbs.  Next few weeks she doesn't have much coming up for herself.  She is planning being outside as much as possible in the next few weeks.  Does pot gardening.   Subjective:   1. Vitamin D deficiency Patient is taking prescription vitamin D.  Patient last vitamin D level was 35.8.  Patient denies nausea, vomiting, muscle weakness but is positive for fatigue.  2. Hyperlipidemia associated with type 2 diabetes mellitus Patient is on Lipitor.  Last LDL 45, HDL 55, triglycerides 107.  3. Type 2 diabetes mellitus with hyperglycemia, without long-term current use of insulin Patient is on metformin.  Recent increase in A1c.  Assessment/Plan:   1. Vitamin D deficiency Refill- Vitamin D, Ergocalciferol, (DRISDOL) 1.25 MG (50000 UNIT) CAPS capsule; Take 1 capsule (50,000 Units total) by mouth every 7 (seven) days.  Dispense: 4 capsule; Refill: 0  2. Hyperlipidemia associated with type 2 diabetes mellitus Continue Lipitor 10 mg daily.  3. Type 2 diabetes mellitus with hyperglycemia, without long-term current use of insulin Start- Semaglutide,0.25 or  0.5MG /DOS, (OZEMPIC, 0.25 OR 0.5 MG/DOSE,) 2 MG/3ML SOPN; Inject 0.25 mg into the skin once a week.  Dispense: 3 mL; Refill: 0  4. BMI 40.0-44.9, adult  5. Obesity with starting BMI of 44.1 Tracy Graves is currently in the action stage of change. As such, her goal is to continue with weight loss efforts. She has agreed to the Category 3 Plan and keeping a food journal and adhering to recommended goals of 350-450 calories and 30+ protein daily.   Exercise goals: All adults should avoid inactivity. Some physical activity is better than none, and adults who participate in any amount of physical activity gain some health benefits.  Behavioral modification strategies: increasing lean protein intake, meal planning and cooking strategies, keeping healthy foods in the home, and planning for success.  Tracy Graves has agreed to follow-up with our clinic in 3 weeks. She was informed of the importance of frequent follow-up visits to maximize her success with intensive lifestyle modifications for her multiple health conditions.   Objective:   Blood pressure 112/75, pulse 82, temperature 98.4 F (36.9 C), height 5\' 3"  (1.6 m), weight 240 lb (108.9 kg), SpO2 95 %. Body mass index is 42.51 kg/m.  General: Cooperative, alert, well developed, in no acute distress. HEENT: Conjunctivae and lids unremarkable. Cardiovascular: Regular rhythm.  Lungs: Normal work of breathing. Neurologic: No focal deficits.   Lab Results  Component Value Date   CREATININE 0.77 01/21/2023   BUN 10 01/21/2023   NA 140 01/21/2023   K 5.3 (H) 01/21/2023   CL 103  01/21/2023   CO2 21 01/21/2023   Lab Results  Component Value Date   ALT 17 01/21/2023   AST 12 01/21/2023   ALKPHOS 140 (H) 01/21/2023   BILITOT 0.4 01/21/2023   Lab Results  Component Value Date   HGBA1C 5.7 (H) 01/21/2023   HGBA1C 5.7 (H) 09/11/2022   HGBA1C 5.5 03/22/2022   HGBA1C 5.6 07/31/2021   HGBA1C 5.5 02/14/2021   Lab Results  Component Value Date    INSULIN 20.0 01/21/2023   INSULIN 29.2 (H) 03/22/2022   INSULIN 22.8 07/31/2021   INSULIN 16.8 02/14/2021   INSULIN 41.6 (H) 09/12/2020   Lab Results  Component Value Date   TSH 1.390 09/11/2022   Lab Results  Component Value Date   CHOL 120 01/04/2023   HDL 55 01/04/2023   LDLCALC 45 01/04/2023   TRIG 107 01/04/2023   CHOLHDL 2.2 01/04/2023   Lab Results  Component Value Date   VD25OH 35.8 01/21/2023   VD25OH 37.2 09/11/2022   VD25OH 45.3 03/22/2022   Lab Results  Component Value Date   WBC 10.0 03/22/2022   HGB 16.7 (H) 03/22/2022   HCT 49.2 (H) 03/22/2022   MCV 93 03/22/2022   PLT 463 (H) 03/22/2022   No results found for: "IRON", "TIBC", "FERRITIN"  Attestation Statements:   Reviewed by clinician on day of visit: allergies, medications, problem list, medical history, surgical history, family history, social history, and previous encounter notes.  I, Malcolm Metro, RMA, am acting as transcriptionist for [name], MD.  I have reviewed the above documentation for accuracy and completeness, and I agree with the above. - [name], MD

## 2023-04-10 ENCOUNTER — Encounter (INDEPENDENT_AMBULATORY_CARE_PROVIDER_SITE_OTHER): Payer: Self-pay

## 2023-04-23 DIAGNOSIS — R809 Proteinuria, unspecified: Secondary | ICD-10-CM | POA: Diagnosis not present

## 2023-04-23 DIAGNOSIS — I10 Essential (primary) hypertension: Secondary | ICD-10-CM | POA: Diagnosis not present

## 2023-04-23 DIAGNOSIS — D72829 Elevated white blood cell count, unspecified: Secondary | ICD-10-CM | POA: Diagnosis not present

## 2023-04-23 DIAGNOSIS — E119 Type 2 diabetes mellitus without complications: Secondary | ICD-10-CM | POA: Diagnosis not present

## 2023-04-23 DIAGNOSIS — E039 Hypothyroidism, unspecified: Secondary | ICD-10-CM | POA: Diagnosis not present

## 2023-05-01 ENCOUNTER — Ambulatory Visit (INDEPENDENT_AMBULATORY_CARE_PROVIDER_SITE_OTHER): Payer: Medicaid Other | Admitting: Family Medicine

## 2023-05-01 ENCOUNTER — Encounter (INDEPENDENT_AMBULATORY_CARE_PROVIDER_SITE_OTHER): Payer: Self-pay | Admitting: Family Medicine

## 2023-05-01 VITALS — BP 104/69 | HR 86 | Temp 98.4°F | Ht 63.0 in | Wt 238.0 lb

## 2023-05-01 DIAGNOSIS — E1165 Type 2 diabetes mellitus with hyperglycemia: Secondary | ICD-10-CM | POA: Diagnosis not present

## 2023-05-01 DIAGNOSIS — E559 Vitamin D deficiency, unspecified: Secondary | ICD-10-CM

## 2023-05-01 DIAGNOSIS — Z6841 Body Mass Index (BMI) 40.0 and over, adult: Secondary | ICD-10-CM

## 2023-05-01 DIAGNOSIS — Z7985 Long-term (current) use of injectable non-insulin antidiabetic drugs: Secondary | ICD-10-CM

## 2023-05-01 DIAGNOSIS — E669 Obesity, unspecified: Secondary | ICD-10-CM

## 2023-05-01 MED ORDER — OZEMPIC (0.25 OR 0.5 MG/DOSE) 2 MG/3ML ~~LOC~~ SOPN: 0.2500 mg | PEN_INJECTOR | SUBCUTANEOUS | 0 refills | Status: DC

## 2023-05-01 MED ORDER — VITAMIN D (ERGOCALCIFEROL) 1.25 MG (50000 UNIT) PO CAPS: 50000.0000 [IU] | ORAL_CAPSULE | ORAL | 0 refills | Status: DC

## 2023-05-01 NOTE — Progress Notes (Signed)
Chief Complaint:   OBESITY Tracy Graves is here to discuss her progress with her obesity treatment plan along with follow-up of her obesity related diagnoses. Tracy Graves is on the Category 3 Plan and keeping a food journal and adhering to recommended goals of 350-450 calories and 30+ protein and states she is following her eating plan approximately 100% of the time. Tracy Graves states she is walking and resistance 45-60 minutes 3-5 times per week.  Today's visit was #: 35 Starting weight: 249 lbs Starting date: 09/12/2020 Today's weight: 238 lbs Today's date: 05/01/2023 Total lbs lost to date: 11 lbs Total lbs lost since last in-office visit: 2 lbs  Interim History: Patient has been doing mostly the same thing.  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  She started journaling in [employer] the day she started Ozempic and got a new food scale.  She feels like everything is going well.  Averaging less than 1000 calories a day since starting Ozempic.  She is wondering how to get more food in and be conscious of content of that food.  She is wondering if she can substitute plant protein.  She is wondering about whether or not she should get a wearable tracker.  Subjective:   1. Type 2 diabetes mellitus with hyperglycemia, without long-term current use of insulin (HCC) Recent A1c 5.3.  Patient is on Ozempic 0.25 mg subcu weekly.  2. Vitamin D deficiency Patient is on prescription Vitamin D.  Patient denies nausea, vomiting, muscle weakness, but is positive for fatigue.  Assessment/Plan:   1. Type 2 diabetes mellitus with hyperglycemia, without long-term current use of insulin (HCC) Refill- Semaglutide,0.25 or 0.5MG /DOS, (OZEMPIC, 0.25 OR 0.5 MG/DOSE,) 2 MG/3ML SOPN; Inject 0.25 mg into the skin once a week.  Dispense: 3 mL; Refill: 0  2. Vitamin D deficiency Refill- Vitamin D, Ergocalciferol, (DRISDOL) 1.25 MG (50000 UNIT) CAPS capsule; Take 1 capsule (50,000 Units total) by mouth  every 7 (seven) days.  Dispense: 4 capsule; Refill: 0  3. BMI 40.0-44.9, adult (HCC)  4. Obesity with starting BMI of 44.1 Tracy Graves is currently in the action stage of change. As such, her goal is to continue with weight loss efforts. She has agreed to the Category 3 Plan and keeping a food journal and adhering to recommended goals of 350-450 calories and 30+ protein at lunch.   Exercise goals: All adults should avoid inactivity. Some physical activity is better than none, and adults who participate in any amount of physical activity gain some health benefits.  Behavioral modification strategies: increasing lean protein intake, meal planning and cooking strategies, keeping healthy foods in the home, and planning for success.  Tracy Graves has agreed to follow-up with our clinic in 4-5 weeks. She was informed of the importance of frequent follow-up visits to maximize her success with intensive lifestyle modifications for her multiple health conditions.   Objective:   Blood pressure 104/69, pulse 86, temperature 98.4 F (36.9 C), height 5\' 3"  (1.6 m), weight 238 lb (108 kg), SpO2 96 %. Body mass index is 42.16 kg/m.  General: Cooperative, alert, well developed, in no acute distress. HEENT: Conjunctivae and lids unremarkable. Cardiovascular: Regular rhythm.  Lungs: Normal work of breathing. Neurologic: No focal deficits.   Lab Results  Component Value Date   CREATININE 0.77 01/21/2023   BUN 10 01/21/2023   NA 140 01/21/2023   K 5.3 (H) 01/21/2023   CL 103 01/21/2023   CO2 21 01/21/2023  Lab Results  Component Value Date   ALT 17 01/21/2023   AST 12 01/21/2023   ALKPHOS 140 (H) 01/21/2023   BILITOT 0.4 01/21/2023   Lab Results  Component Value Date   HGBA1C 5.7 (H) 01/21/2023   HGBA1C 5.7 (H) 09/11/2022   HGBA1C 5.5 03/22/2022   HGBA1C 5.6 07/31/2021   HGBA1C 5.5 02/14/2021   Lab Results  Component Value Date   INSULIN 20.0 01/21/2023   INSULIN 29.2 (H) 03/22/2022   INSULIN  22.8 07/31/2021   INSULIN 16.8 02/14/2021   INSULIN 41.6 (H) 09/12/2020   Lab Results  Component Value Date   TSH 1.390 09/11/2022   Lab Results  Component Value Date   CHOL 120 01/04/2023   HDL 55 01/04/2023   LDLCALC 45 01/04/2023   TRIG 107 01/04/2023   CHOLHDL 2.2 01/04/2023   Lab Results  Component Value Date   VD25OH 35.8 01/21/2023   VD25OH 37.2 09/11/2022   VD25OH 45.3 03/22/2022   Lab Results  Component Value Date   WBC 10.0 03/22/2022   HGB 16.7 (H) 03/22/2022   HCT 49.2 (H) 03/22/2022   MCV 93 03/22/2022   PLT 463 (H) 03/22/2022   No results found for: "IRON", "TIBC", "FERRITIN"  Attestation Statements:   Reviewed by clinician on day of visit: allergies, medications, problem list, medical history, surgical history, family history, social history, and previous encounter notes.  I, Malcolm Metro, RMA, am acting as transcriptionist for [name], MD.  I have reviewed the above documentation for accuracy and completeness, and I agree with the above. - [name], MD

## 2023-05-13 ENCOUNTER — Telehealth: Payer: Medicaid Other | Admitting: Family Medicine

## 2023-05-13 DIAGNOSIS — R3989 Other symptoms and signs involving the genitourinary system: Secondary | ICD-10-CM

## 2023-05-13 MED ORDER — CEPHALEXIN 500 MG PO CAPS: 500.0000 mg | ORAL_CAPSULE | Freq: Two times a day (BID) | ORAL | 0 refills | Status: AC

## 2023-05-13 NOTE — Progress Notes (Signed)

## 2023-06-01 ENCOUNTER — Telehealth: Payer: Medicaid Other | Admitting: Physician Assistant

## 2023-06-01 DIAGNOSIS — K0889 Other specified disorders of teeth and supporting structures: Secondary | ICD-10-CM | POA: Diagnosis not present

## 2023-06-01 MED ORDER — AMOXICILLIN-POT CLAVULANATE 875-125 MG PO TABS: 1.0000 | ORAL_TABLET | Freq: Two times a day (BID) | ORAL | 0 refills | Status: DC

## 2023-06-01 NOTE — Progress Notes (Signed)
E-Visit for Dental Pain  We are sorry that you are not feeling well.  Here is how we plan to help!  Based on what you have shared with me in the questionnaire, it sounds like you may possibly have a dental infection.   Augmentin 875-125mg  twice a day for 7 days  It is imperative that you see a dentist within 10 days of this eVisit to determine the cause of the dental pain and be sure it is adequately treated  A toothache or tooth pain is caused when the nerve in the root of a tooth or surrounding a tooth is irritated. Dental (tooth) infection, decay, injury, or loss of a tooth are the most common causes of dental pain. Pain may also occur after an extraction (tooth is pulled out). Pain sometimes originates from other areas and radiates to the jaw, thus appearing to be tooth pain.Bacteria growing inside your mouth can contribute to gum disease and dental decay, both of which can cause pain. A toothache occurs from inflammation of the central portion of the tooth called pulp. The pulp contains nerve endings that are very sensitive to pain. Inflammation to the pulp or pulpitis may be caused by dental cavities, trauma, and infection.    HOME CARE:   For toothaches: Over-the-counter pain medications such as acetaminophen or ibuprofen may be used. Take these as directed on the package while you arrange for a dental appointment. Avoid very cold or hot foods, because they may make the pain worse. You may get relief from biting on a cotton ball soaked in oil of cloves. You can get oil of cloves at most drug stores.  For jaw pain:  Aspirin may be helpful for problems in the joint of the jaw in adults. If pain happens every time you open your mouth widely, the temporomandibular joint (TMJ) may be the source of the pain. Yawning or taking a large bite of food may worsen the pain. An appointment with your doctor or dentist will help you find the cause.     GET HELP RIGHT AWAY IF:  You have a high  fever or chills If you have had a recent head or face injury and develop headache, light headedness, nausea, vomiting, or other symptoms that concern you after an injury to your face or mouth, you could have a more serious injury in addition to your dental injury. A facial rash associated with a toothache: This condition may improve with medication. Contact your doctor for them to decide what is appropriate. Any jaw pain occurring with chest pain: Although jaw pain is most commonly caused by dental disease, it is sometimes referred pain from other areas. People with heart disease, especially people who have had stents placed, people with diabetes, or those who have had heart surgery may have jaw pain as a symptom of heart attack or angina. If your jaw or tooth pain is associated with lightheadedness, sweating, or shortness of breath, you should see a doctor as soon as possible. Trouble swallowing or excessive pain or bleeding from gums: If you have a history of a weakened immune system, diabetes, or steroid use, you may be more susceptible to infections. Infections can often be more severe and extensive or caused by unusual organisms. Dental and gum infections in people with these conditions may require more aggressive treatment. An abscess may need draining or IV antibiotics, for example.  MAKE SURE YOU   Understand these instructions. Will watch your condition. Will get help right away  if you are not doing well or get worse.  Thank you for choosing an e-visit.  Your e-visit answers were reviewed by a board certified advanced clinical practitioner to complete your personal care plan. Depending upon the condition, your plan could have included both over the counter or prescription medications.  Please review your pharmacy choice. Make sure the pharmacy is open so you can pick up prescription now. If there is a problem, you may contact your provider through [employer] and have the prescription  routed to another pharmacy.  Your safety is important to [place]. If you have drug allergies check your prescription carefully.   For the next 24 hours you can use MyChart to ask questions about today's visit, request a non-urgent call back, or ask for a work or school excuse. You will get an email in the next two days asking about your experience. I hope that your e-visit has been valuable and will speed your recovery.   I have spent 5 minutes in review of e-visit questionnaire, review and updating patient chart, medical decision making and response to patient.   Tylene Fantasia Ward, PA-C

## 2023-06-05 ENCOUNTER — Encounter (INDEPENDENT_AMBULATORY_CARE_PROVIDER_SITE_OTHER): Payer: Self-pay | Admitting: Family Medicine

## 2023-06-05 ENCOUNTER — Ambulatory Visit (INDEPENDENT_AMBULATORY_CARE_PROVIDER_SITE_OTHER): Payer: Medicaid Other | Admitting: Family Medicine

## 2023-06-05 VITALS — BP 114/74 | HR 86 | Temp 98.3°F | Ht 63.0 in | Wt 237.0 lb

## 2023-06-05 DIAGNOSIS — E559 Vitamin D deficiency, unspecified: Secondary | ICD-10-CM | POA: Diagnosis not present

## 2023-06-05 DIAGNOSIS — Z6841 Body Mass Index (BMI) 40.0 and over, adult: Secondary | ICD-10-CM

## 2023-06-05 DIAGNOSIS — E669 Obesity, unspecified: Secondary | ICD-10-CM

## 2023-06-05 DIAGNOSIS — E1165 Type 2 diabetes mellitus with hyperglycemia: Secondary | ICD-10-CM | POA: Diagnosis not present

## 2023-06-05 DIAGNOSIS — Z7985 Long-term (current) use of injectable non-insulin antidiabetic drugs: Secondary | ICD-10-CM

## 2023-06-05 MED ORDER — VITAMIN D (ERGOCALCIFEROL) 1.25 MG (50000 UNIT) PO CAPS: 50000.0000 [IU] | ORAL_CAPSULE | ORAL | 0 refills | Status: DC

## 2023-06-05 NOTE — Progress Notes (Unsigned)
Chief Complaint:   OBESITY Tracy Graves is here to discuss her progress with her obesity treatment plan along with follow-up of her obesity related diagnoses. Tracy Graves is on the Category 3 Plan and keeping a food journal and adhering to recommended goals of 350-450 calories and 30+ grams of protein at lunch and states she is following her eating plan approximately 90% of the time. Tracy Graves states she is walking 1/2 mile 6 times per week.   Today's visit was #: 36 Starting weight: 249 lbs Starting date: 09/12/2020 Today's weight: 237 lbs Today's date: 06/05/2023 Total lbs lost to date: 12 Total lbs lost since last in-office visit: 1  Interim History: Patient has had some stressful situations happen near where she lives over the last month. She went to two cookouts over [facility] Day.  She was diagnosed and treated for an UTI since last appt.  She mentions she has been trying to come up with different preparations for food on plan.  Her hunger has improved and has occasional nausea. Most days are eggs and a sandwich with two pieces toast and a small thing of yogurt or a string cheese for Breakfast.  This is around 320 calories.  Lunch is a sandwich with 4-5 ounces of meat, vegetable, fruit and that averages around 350 calories.  Supper is a salad and 4-6oz of whatever meat and sometimes that is a Customer service manager, 45 calorie bread with lettuce, tomato, onion and small salad. Snack calories are normally greek yogurt, string cheese, 1-2oz of hummus with celery or broccoli.  She is dealing with tooth issues currently and is on augmentin.  Subjective:   1. Vitamin D deficiency Patient denies nausea, vomiting, or muscle weakness but notes fatigue.  Her last vitamin D level was of 35.8.  2. Type 2 diabetes mellitus with hyperglycemia, without long-term current use of insulin (HCC) Patient is on Ozempic 0.25 mg, and she is able to get more food and now that her dose is lowered.  Assessment/Plan:   1. Vitamin D  deficiency Patient will continue prescription vitamin D, and we will refill for 1 month.  - Vitamin D, Ergocalciferol, (DRISDOL) 1.25 MG (50000 UNIT) CAPS capsule; Take 1 capsule (50,000 Units total) by mouth every 7 (seven) days.  Dispense: 4 capsule; Refill: 0  2. Type 2 diabetes mellitus with hyperglycemia, without long-term current use of insulin (HCC) Patient will continue Ozempic with no change in dose.  3. BMI 40.0-44.9, adult (HCC)  4. Obesity with starting BMI of 44.1 Tracy Graves is currently in the action stage of change. As such, her goal is to continue with weight loss efforts. She has agreed to keeping a food journal and adhering to recommended goals of 1500-1600 calories and 95+ grams of protein daily.   Exercise goals: All adults should avoid inactivity. Some physical activity is better than none, and adults who participate in any amount of physical activity gain some health benefits.  Behavioral modification strategies: increasing lean protein intake, meal planning and cooking strategies, keeping healthy foods in the home, and planning for success.  Tracy Graves has agreed to follow-up with our clinic in 5 weeks. She was informed of the importance of frequent follow-up visits to maximize her success with intensive lifestyle modifications for her multiple health conditions.   Objective:   Blood pressure 114/74, pulse 86, temperature 98.3 F (36.8 C), height 5\' 3"  (1.6 m), weight 237 lb (107.5 kg), SpO2 97 %. Body mass index is 41.98 kg/m.  General: Cooperative, alert,  well developed, in no acute distress. HEENT: Conjunctivae and lids unremarkable. Cardiovascular: Regular rhythm.  Lungs: Normal work of breathing. Neurologic: No focal deficits.   Lab Results  Component Value Date   CREATININE 0.77 01/21/2023   BUN 10 01/21/2023   NA 140 01/21/2023   K 5.3 (H) 01/21/2023   CL 103 01/21/2023   CO2 21 01/21/2023   Lab Results  Component Value Date   ALT 17 01/21/2023   AST 12  01/21/2023   ALKPHOS 140 (H) 01/21/2023   BILITOT 0.4 01/21/2023   Lab Results  Component Value Date   HGBA1C 5.7 (H) 01/21/2023   HGBA1C 5.7 (H) 09/11/2022   HGBA1C 5.5 03/22/2022   HGBA1C 5.6 07/31/2021   HGBA1C 5.5 02/14/2021   Lab Results  Component Value Date   INSULIN 20.0 01/21/2023   INSULIN 29.2 (H) 03/22/2022   INSULIN 22.8 07/31/2021   INSULIN 16.8 02/14/2021   INSULIN 41.6 (H) 09/12/2020   Lab Results  Component Value Date   TSH 1.390 09/11/2022   Lab Results  Component Value Date   CHOL 120 01/04/2023   HDL 55 01/04/2023   LDLCALC 45 01/04/2023   TRIG 107 01/04/2023   CHOLHDL 2.2 01/04/2023   Lab Results  Component Value Date   VD25OH 35.8 01/21/2023   VD25OH 37.2 09/11/2022   VD25OH 45.3 03/22/2022   Lab Results  Component Value Date   WBC 10.0 03/22/2022   HGB 16.7 (H) 03/22/2022   HCT 49.2 (H) 03/22/2022   MCV 93 03/22/2022   PLT 463 (H) 03/22/2022   No results found for: "IRON", "TIBC", "FERRITIN"  Attestation Statements:   Reviewed by clinician on day of visit: allergies, medications, problem list, medical history, surgical history, family history, social history, and previous encounter notes.   I, Burt Knack, am acting as transcriptionist for [name], MD.  I have reviewed the above documentation for accuracy and completeness, and I agree with the above. - [name], MD

## 2023-06-28 DIAGNOSIS — Z1151 Encounter for screening for human papillomavirus (HPV): Secondary | ICD-10-CM | POA: Diagnosis not present

## 2023-06-28 DIAGNOSIS — Z0000 Encounter for general adult medical examination without abnormal findings: Secondary | ICD-10-CM | POA: Diagnosis not present

## 2023-06-28 DIAGNOSIS — B372 Candidiasis of skin and nail: Secondary | ICD-10-CM | POA: Diagnosis not present

## 2023-06-28 DIAGNOSIS — L732 Hidradenitis suppurativa: Secondary | ICD-10-CM | POA: Diagnosis not present

## 2023-06-28 DIAGNOSIS — Z0001 Encounter for general adult medical examination with abnormal findings: Secondary | ICD-10-CM | POA: Diagnosis not present

## 2023-07-04 ENCOUNTER — Encounter (HOSPITAL_COMMUNITY): Payer: Self-pay | Admitting: Emergency Medicine

## 2023-07-04 ENCOUNTER — Telehealth: Payer: Medicaid Other | Admitting: Family Medicine

## 2023-07-04 ENCOUNTER — Ambulatory Visit (HOSPITAL_COMMUNITY)
Admission: EM | Admit: 2023-07-04 | Discharge: 2023-07-04 | Disposition: A | Discharge status: Home / Self Care | Payer: Medicaid Other

## 2023-07-04 ENCOUNTER — Other Ambulatory Visit: Payer: Self-pay

## 2023-07-04 DIAGNOSIS — T148XXA Other injury of unspecified body region, initial encounter: Secondary | ICD-10-CM | POA: Diagnosis not present

## 2023-07-04 DIAGNOSIS — T63484A Toxic effect of venom of other arthropod, undetermined, initial encounter: Secondary | ICD-10-CM

## 2023-07-04 DIAGNOSIS — W57XXXA Bitten or stung by nonvenomous insect and other nonvenomous arthropods, initial encounter: Secondary | ICD-10-CM | POA: Diagnosis not present

## 2023-07-04 DIAGNOSIS — S80862A Insect bite (nonvenomous), left lower leg, initial encounter: Secondary | ICD-10-CM

## 2023-07-04 NOTE — Addendum Note (Signed)
Addended byReed Pandy on: 07/04/2023 06:06 PM   Modules accepted: Level of Service

## 2023-07-04 NOTE — Discharge Instructions (Signed)
As we discussed, for the next few weeks please monitor yourself for signs of infection at the site of the wounds which would include increased redness, increased pain, blistering, drainage.  If you are concerned that the wounds have become infected, please return to urgent care to see whether or not you may need antibiotics.  I do not believe that you need them at this time as I do not see any evidence of infection.  Also for the next few weeks, please monitor yourself for signs of systemic illness which would include fever, chills, body aches, muscle pain, joint pain, nausea, vomiting, diarrhea, headache, changes in your vision.  You are welcome to apply warm moist heat to the areas to help expedite resolution of the hematomas in your left lower leg.  Thank you for visiting [facility] today. If any these occur, please go to the emergency room for further evaluation.

## 2023-07-04 NOTE — ED Triage Notes (Signed)
Patient reports Monday around 1 AM felt a brief burning sensation, 3 different times.  Assumed it was a bug, but never saw a bug.  Patient has 3 red circles on back of left leg.  Each one is a different size.  Intermittently, these area will itch.  No pain the largest one is on back of left lower leg and has a very tiny scab in the center of redness.

## 2023-07-04 NOTE — Progress Notes (Signed)
E-Visit for Insect Sting  Thank you for describing the insect sting for [place].  Here is how we plan to help!   You are welcomed to send [place] pictures if you have some as it is difficult to assess your current condition without them.  If you are concerned for something greater, I recommend being seen in person by your PCP or at an urgent care  clinic.   An uncomplicated insect sting that just occurred and can be closely followed using the instructions in your care plan.  The 2 greatest risks from insect stings are allergic reaction, which can be fatal in some people and infection, which is more common and less serious.  Bees, wasps, yellow jackets, and hornets belong to a class of insects called Hymenoptera.  Most insect stings cause only minor discomfort.  Stings can happen anywhere on the body and can be painful.  Most stings are from honey bees or yellow jackets.  Fire ants can sting multiple times.  The sites of the stings are more likely to become infected.    Provided a home care guide for insect stings and instructions on when to call for help.  What can be used to prevent Insect Stings?  Insect repellant with at least 20% DEET.  Wearing long pants and shirts with socks and shoes.  Wear dark or drab-colored clothes rather than bright colors.  Avoid using perfumes and hair sprays; these attract insects.  HOME CARE ADVICE:  1. Stinger removal: The stinger looks like a tiny black dot in the sting. Use a fingernail, credit card edge, or knife-edge to scrape it off.  Don't pull it out because it squeezes out more venom. If the stinger is below the skin surface, leave it alone.  It will be shed with normal skin healing. 2. Use cold compresses to the area of the sting for 10-20 minutes.  You may repeat this as needed to relieve symptoms of pain and swelling. 3.  For pain relief, take acetominophen 650 mg 4-6 hours as needed or ibuprofen 400 mg every 6-8 hours as needed or naproxen 250-500 mg  every 12 hours as needed. 4.  You can also use hydrocortisone cream 0.5% or 1% up to 4 times daily as needed for itching. 5.  If the sting becomes very itchy, take Benadryl 25-50 mg, follow directions on box. 6.  Wash the area 2-3 times daily with antibacterial soap and warm water. 7. Call your Doctor if: Fever, a severe headache, or rash occur in the next 2 weeks. Sting area begins to look infected. Redness and swelling worsens after home treatment. Your current symptoms become worse.    MAKE SURE YOU:  Understand these instructions. Will watch your condition. Will get help right away if you are not doing well or get worse.  Thank you for choosing an e-visit.  Your e-visit answers were reviewed by a board certified advanced clinical practitioner to complete your personal care plan. Depending upon the condition, your plan could have included both over the counter or prescription medications.  Please review your pharmacy choice. Make sure the pharmacy is open so you can pick up prescription now. If there is a problem, you may contact your provider through [employer] and have the prescription routed to another pharmacy.  Your safety is important to [place]. If you have drug allergies check your prescription carefully.   For the next 24 hours you can use MyChart to ask questions about today's visit, request a non-urgent  call back, or ask for a work or school excuse. You will get an email in the next two days asking about your experience. I hope that your e-visit has been valuable and will speed your recovery.  I have spent 5 minutes in review of e-visit questionnaire, review and updating patient chart, medical decision making and response to patient.   Reed Pandy, PA-C

## 2023-07-04 NOTE — ED Provider Notes (Addendum)
[facility]    CSN: [id number] Arrival date & time: 07/04/23  1806    HISTORY  No chief complaint on file.  HPI Tracy Graves is a pleasant, 62 y.o. female who presents to urgent care today. Patient reports Monday around 1 AM felt a brief burning sensation, 3 different times.  Assumed it was a bug, but never saw a bug.  Patient has 3 red circles on back of left leg.  Each one is a different size.  Intermittently, these area will itch.  No pain the largest one is on back of left lower leg and has a very tiny scab in the center of redness.  Denies fever, bodies, chills, nausea, vomiting, diarrhea, fatigue, headache.  Patient states she is never had a similar bite in the past.  The history is provided by the patient.   Past Medical History:  Diagnosis Date   Abdominal hernia    Abscess    chest wall, right breast   Cellulitis    Diabetes (HCC)    History of kidney stones    Hyperglycemia due to type 2 diabetes mellitus (HCC) 06/05/2015   Hypertension    Hypothyroid    2016   Intertrigo    Morbid obesity due to excess calories (HCC) 06/05/2015   Multiple food allergies    Shortness of breath    Tobacco use    Patient Active Problem List   Diagnosis Date Noted   OSA (obstructive sleep apnea) 12/27/2020   Mixed hyperlipidemia 12/27/2020   Aortic atherosclerosis (HCC) 09/21/2020   Renal stone 01/28/2017   Hydronephrosis 12/23/2016   UTI (urinary tract infection) 12/23/2016   Essential hypertension 12/23/2016   Hyponatremia 12/23/2016   Hypothyroidism, acquired 06/07/2015   Morbid obesity (HCC) 06/05/2015   Tobacco abuse 06/05/2015   Type 2 diabetes mellitus without complication, without long-term current use of insulin (HCC) 06/05/2015   Past Surgical History:  Procedure Laterality Date   IR GENERIC HISTORICAL  12/23/2016   IR NEPHROSTOMY PLACEMENT LEFT 12/23/2016 Berdine Dance, MD [facility]   NEPHROLITHOTOMY Left 01/28/2017   Procedure: LEFT NEPHROLITHOTOMY  PERCUTANEOUS;  Surgeon: Crist Fat, MD;  Location: [facility];  Service: Urology;  Laterality: Left;   NEPHROSTOMY TUBE PLACEMENT ([facility] HX)  12/23/2016   OB History     Gravida  1   Para      Term      Preterm      AB      Living  1      SAB      IAB      Ectopic      Multiple      Live Births             Home Medications    Prior to Admission medications   Medication Sig Start Date End Date Taking? Authorizing Provider  atorvastatin (LIPITOR) 10 MG tablet Take 1 tablet (10 mg total) by mouth daily. 01/21/23   Langston Reusing, MD  levothyroxine (SYNTHROID) 125 MCG tablet Take 125 mcg by mouth daily before breakfast.     [provider]  metFORMIN (GLUCOPHAGE) 500 MG tablet Take 500 mg by mouth daily with breakfast.     [provider]  metoprolol succinate (TOPROL-XL) 50 MG 24 hr tablet Take 50 mg by mouth daily. 09/04/20   [provider]  polyethylene glycol (MIRALAX / GLYCOLAX) 17 g packet Take 17 g by mouth as needed.    [provider]  Semaglutide,0.25 or 0.5MG /DOS, (OZEMPIC, 0.25 OR 0.5 MG/DOSE,) 2 MG/3ML SOPN Inject 0.25 mg into the skin once a week. 05/01/23   Langston Reusing, MD  Vitamin D, Ergocalciferol, (DRISDOL) 1.25 MG (50000 UNIT) CAPS capsule Take 1 capsule (50,000 Units total) by mouth every 7 (seven) days. 06/05/23   Langston Reusing, MD    Family History Family History  Problem Relation Age of Onset   Breast cancer Mother    Cancer Mother    Obesity Mother    Leukemia Maternal Grandfather    Ovarian cancer Maternal Aunt    Heart attack Father    Diabetes Mellitus II Father    Diabetes Father    Thyroid disease Father    Depression Father    Obesity Father    Social History Social History   Tobacco Use   Smoking status: Some Days    Packs/day: 0.75    Years: 23.00    Additional pack years: 0.00    Total pack years: 17.25    Types: Cigarettes    Last attempt to quit: 10/23/2019     Years since quitting: 3.6   Smokeless tobacco: Never   Tobacco comments:    pt stated smokes every now and then as of 06/09/20  Vaping Use   Vaping Use: Never used  Substance Use Topics   Alcohol use: No   Drug use: No   Allergies   Fish allergy and Other  Review of Systems Review of Systems Pertinent findings revealed after performing a 14 point review of systems has been noted in the history of present illness.  Physical Exam Vital Signs BP 139/69 (BP Location: Left Arm) Comment (BP Location): large cuff  Pulse 94   Temp 98.8 F (37.1 C) (Oral)   Resp (!) 22   SpO2 95%   No data found.  Physical Exam Vitals and nursing note reviewed.  Constitutional:      General: She is not in acute distress.    Appearance: Normal appearance. She is obese. She is not ill-appearing, toxic-appearing or diaphoretic.  HENT:     Head: Normocephalic and atraumatic.  Eyes:     Pupils: Pupils are equal, round, and reactive to light.  Cardiovascular:     Rate and Rhythm: Normal rate and regular rhythm.  Pulmonary:     Effort: Pulmonary effort is normal.     Breath sounds: Normal breath sounds.  Musculoskeletal:        General: Normal range of motion.     Cervical back: Normal range of motion and neck supple.  Skin:    General: Skin is warm and dry.     Findings: Lesion (There are 3 hematomas on the back of patient's left lower leg without surrounding erythema, excoriation, central clearing or tenderness to palpation.) present.  Neurological:     General: No focal deficit present.     Mental Status: She is alert and oriented to person, place, and time. Mental status is at baseline.  Psychiatric:        Mood and Affect: Mood normal.        Behavior: Behavior normal.        Thought Content: Thought content normal.        Judgment: Judgment normal.     Visual Acuity Right Eye Distance:   Left Eye Distance:   Bilateral Distance:    Right Eye Near:   Left Eye Near:    Bilateral  Near:     UC Couse /  Diagnostics / Procedures:     Radiology No results found.  Procedures Procedures (including critical care time) EKG  Pending results:  Labs Reviewed - No data to display  Medications Ordered in UC: Medications - No data to display  UC Diagnoses / Final Clinical Impressions(s)   I have reviewed the triage vital signs and the nursing notes.  Pertinent labs & imaging results that were available during my care of the patient were reviewed by me and considered in my medical decision making (see chart for details).    Final diagnoses:  Insect bite of left lower leg, initial encounter  Hematoma   Patient advised to monitor the lesions on the back of her leg for signs and symptoms of infection and to monitor herself for signs and symptoms of systemic illness.  Follow-up as needed.  Please see discharge instructions below for details of plan of care as provided to patient. ED Prescriptions   None    PDMP not reviewed this encounter.  Pending results:  Labs Reviewed - No data to display  Discharge Instructions:   Discharge Instructions      As we discussed, for the next few weeks please monitor yourself for signs of infection at the site of the wounds which would include increased redness, increased pain, blistering, drainage.  If you are concerned that the wounds have become infected, please return to urgent care to see whether or not you may need antibiotics.  I do not believe that you need them at this time as I do not see any evidence of infection.  Also for the next few weeks, please monitor yourself for signs of systemic illness which would include fever, chills, body aches, muscle pain, joint pain, nausea, vomiting, diarrhea, headache, changes in your vision.  You are welcome to apply warm moist heat to the areas to help expedite resolution of the hematomas in your left lower leg.  Thank you for visiting [facility] today. If any these  occur, please go to the emergency room for further evaluation.    Disposition Upon Discharge:  Condition: stable for discharge home  Patient presented with an acute illness with associated systemic symptoms and significant discomfort requiring urgent management. In my opinion, this is a condition that a prudent lay person (someone who possesses an average knowledge of health and medicine) may potentially expect to result in complications if not addressed urgently such as respiratory distress, impairment of bodily function or dysfunction of bodily organs.   Routine symptom specific, illness specific and/or disease specific instructions were discussed with the patient and/or caregiver at length.   As such, the patient has been evaluated and assessed, work-up was performed and treatment was provided in alignment with urgent care protocols and evidence based medicine.  Patient/parent/caregiver has been advised that the patient may require follow up for further testing and treatment if the symptoms continue in spite of treatment, as clinically indicated and appropriate.  Patient/parent/caregiver has been advised to return to the [facility] or PCP if no better; to PCP or the Emergency Department if new signs and symptoms develop, or if the current signs or symptoms continue to change or worsen for further workup, evaluation and treatment as clinically indicated and appropriate  The patient will follow up with their current PCP if and as advised. If the patient does not currently have a PCP we will assist them in obtaining one.   The patient may need specialty follow up if the symptoms continue, in spite  of conservative treatment and management, for further workup, evaluation, consultation and treatment as clinically indicated and appropriate.  Patient/parent/caregiver verbalized understanding and agreement of plan as discussed.  All questions were addressed during visit.  Please see discharge instructions  below for further details of plan.  This office note has been dictated using Teaching laboratory technician.  Unfortunately, this method of dictation can sometimes lead to typographical or grammatical errors.  I apologize for your inconvenience in advance if this occurs.  Please do not hesitate to reach out to me if clarification is needed.      Theadora Rama Scales, PA-C 07/04/23 1907    Theadora Rama Scales, PA-C 07/04/23 1907

## 2023-07-10 ENCOUNTER — Encounter (INDEPENDENT_AMBULATORY_CARE_PROVIDER_SITE_OTHER): Payer: Self-pay | Admitting: Family Medicine

## 2023-07-10 ENCOUNTER — Telehealth (INDEPENDENT_AMBULATORY_CARE_PROVIDER_SITE_OTHER): Payer: Medicaid Other | Admitting: Family Medicine

## 2023-07-10 DIAGNOSIS — Z6841 Body Mass Index (BMI) 40.0 and over, adult: Secondary | ICD-10-CM

## 2023-07-10 DIAGNOSIS — Z7985 Long-term (current) use of injectable non-insulin antidiabetic drugs: Secondary | ICD-10-CM | POA: Diagnosis not present

## 2023-07-10 DIAGNOSIS — E1165 Type 2 diabetes mellitus with hyperglycemia: Secondary | ICD-10-CM | POA: Diagnosis not present

## 2023-07-10 DIAGNOSIS — E785 Hyperlipidemia, unspecified: Secondary | ICD-10-CM

## 2023-07-10 DIAGNOSIS — E559 Vitamin D deficiency, unspecified: Secondary | ICD-10-CM | POA: Diagnosis not present

## 2023-07-10 DIAGNOSIS — E1169 Type 2 diabetes mellitus with other specified complication: Secondary | ICD-10-CM | POA: Diagnosis not present

## 2023-07-10 DIAGNOSIS — E669 Obesity, unspecified: Secondary | ICD-10-CM | POA: Diagnosis not present

## 2023-07-10 MED ORDER — VITAMIN D (ERGOCALCIFEROL) 1.25 MG (50000 UNIT) PO CAPS: 50000.0000 [IU] | ORAL_CAPSULE | ORAL | 0 refills | Status: DC

## 2023-07-10 MED ORDER — ATORVASTATIN CALCIUM 10 MG PO TABS: 10.0000 mg | ORAL_TABLET | Freq: Every day | ORAL | 3 refills | Status: DC

## 2023-07-10 MED ORDER — OZEMPIC (0.25 OR 0.5 MG/DOSE) 2 MG/3ML ~~LOC~~ SOPN: 0.2500 mg | PEN_INJECTOR | SUBCUTANEOUS | 0 refills | Status: DC

## 2023-07-10 MED ORDER — BD PEN NEEDLE NANO 2ND GEN 32G X 4 MM MISC: 1.0000 | Freq: Two times a day (BID) | 0 refills | Status: AC
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
## 2023-08-05 ENCOUNTER — Encounter (INDEPENDENT_AMBULATORY_CARE_PROVIDER_SITE_OTHER): Payer: Self-pay | Admitting: Family Medicine

## 2023-08-05 ENCOUNTER — Ambulatory Visit (INDEPENDENT_AMBULATORY_CARE_PROVIDER_SITE_OTHER): Payer: Medicaid Other | Admitting: Family Medicine

## 2023-08-05 VITALS — BP 107/72 | HR 69 | Temp 98.2°F | Ht 63.0 in | Wt 235.0 lb

## 2023-08-05 DIAGNOSIS — Z6841 Body Mass Index (BMI) 40.0 and over, adult: Secondary | ICD-10-CM | POA: Diagnosis not present

## 2023-08-05 DIAGNOSIS — E559 Vitamin D deficiency, unspecified: Secondary | ICD-10-CM

## 2023-08-05 DIAGNOSIS — E1165 Type 2 diabetes mellitus with hyperglycemia: Secondary | ICD-10-CM | POA: Diagnosis not present

## 2023-08-05 DIAGNOSIS — Z7985 Long-term (current) use of injectable non-insulin antidiabetic drugs: Secondary | ICD-10-CM

## 2023-08-05 DIAGNOSIS — E669 Obesity, unspecified: Secondary | ICD-10-CM | POA: Diagnosis not present

## 2023-08-05 MED ORDER — VITAMIN D (ERGOCALCIFEROL) 1.25 MG (50000 UNIT) PO CAPS
50000.0000 [IU] | ORAL_CAPSULE | ORAL | 0 refills | Status: DC
Start: 2023-08-05 — End: 2023-08-26
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
## 2023-08-06 ENCOUNTER — Other Ambulatory Visit (INDEPENDENT_AMBULATORY_CARE_PROVIDER_SITE_OTHER): Payer: Self-pay | Admitting: Family Medicine

## 2023-08-06 DIAGNOSIS — E559 Vitamin D deficiency, unspecified: Secondary | ICD-10-CM

## 2023-08-26 ENCOUNTER — Encounter (INDEPENDENT_AMBULATORY_CARE_PROVIDER_SITE_OTHER): Payer: Self-pay | Admitting: Family Medicine

## 2023-08-26 ENCOUNTER — Telehealth (INDEPENDENT_AMBULATORY_CARE_PROVIDER_SITE_OTHER): Payer: Medicaid Other | Admitting: Family Medicine

## 2023-08-26 ENCOUNTER — Other Ambulatory Visit (INDEPENDENT_AMBULATORY_CARE_PROVIDER_SITE_OTHER): Payer: Self-pay | Admitting: Family Medicine

## 2023-08-26 DIAGNOSIS — I10 Essential (primary) hypertension: Secondary | ICD-10-CM | POA: Diagnosis not present

## 2023-08-26 DIAGNOSIS — E559 Vitamin D deficiency, unspecified: Secondary | ICD-10-CM

## 2023-08-26 DIAGNOSIS — Z7984 Long term (current) use of oral hypoglycemic drugs: Secondary | ICD-10-CM | POA: Diagnosis not present

## 2023-08-26 DIAGNOSIS — E1169 Type 2 diabetes mellitus with other specified complication: Secondary | ICD-10-CM

## 2023-08-26 DIAGNOSIS — E1159 Type 2 diabetes mellitus with other circulatory complications: Secondary | ICD-10-CM

## 2023-08-26 DIAGNOSIS — Z7985 Long-term (current) use of injectable non-insulin antidiabetic drugs: Secondary | ICD-10-CM

## 2023-08-26 DIAGNOSIS — Z6841 Body Mass Index (BMI) 40.0 and over, adult: Secondary | ICD-10-CM | POA: Diagnosis not present

## 2023-08-26 MED ORDER — VITAMIN D (ERGOCALCIFEROL) 1.25 MG (50000 UNIT) PO CAPS
50000.0000 [IU] | ORAL_CAPSULE | ORAL | 0 refills | Status: DC
Start: 2023-08-26 — End: 2023-12-18

## 2023-08-26 NOTE — Progress Notes (Signed)
TeleHealth Visit:  This visit was completed with telemedicine (audio/video) technology. Tracy Graves has verbally consented to this TeleHealth visit. The patient is located at home, the provider is located at home. The participants in this visit include the listed provider and patient. The visit was conducted today via MyChart video.  OBESITY Tracy Graves is here to discuss her progress with her obesity treatment plan along with follow-up of her obesity related diagnoses.   Today's visit was # 39 Starting weight: 249 lbs Starting date: 09/12/20 Weight at last in office visit: 235 lbs on 08/05/23 Total weight loss: 14 lbs at last in office visit on 08/05/23. Today's reported weight (08/26/23):  232 lbs  Nutrition Plan: keeping a food journal with goal of 1500-1600 calories and 95 grams of protein daily - 0% adherence.  Current exercise:none  Interim History:  She has been ill with URI for 2 weeks. She has not been tracking calories while ill. Appetite has been down. She is skipping breakfast or lunch. She gets protein in at all meals. Reports adequate water intake. No intake of sugar sweetened beverages.  Assessment/Plan:  1. Type 2 Diabetes Mellitus with other specified complication, without long-term current use of insulin HgbA1c is at goal. Last A1c was 5.7 CBGs: Fasting 95-105. PP 100-110. Episodes of hypoglycemia: no Medication(s): Ozempic 0.25 mg SQ weekly (taking 18 clicks every 10 days) and Metformin 500 mg once daily breakfast  Lab Results  Component Value Date   HGBA1C 5.7 (H) 01/21/2023   HGBA1C 5.7 (H) 09/11/2022   HGBA1C 5.5 03/22/2022   Lab Results  Component Value Date   LDLCALC 45 01/04/2023   CREATININE 0.77 01/21/2023   No results found for: "GFR"  Plan: Continue Ozempic 0.25 mg SQ weekly (taking 18 clicks every 10 days) and Metformin 500 mg once daily breakfast When she starts feeling better start taking 18 clicks of Ozempic weekly.  2.  Hypertension Hypertension well controlled.  She checks blood pressures at home daily. 118/79 this am.  Medication(s): Metoprolol succinate 50 mg daily  BP Readings from Last 3 Encounters:  08/05/23 107/72  07/04/23 139/69  06/05/23 114/74   Lab Results  Component Value Date   CREATININE 0.77 01/21/2023   CREATININE 0.96 09/11/2022   CREATININE 0.88 03/22/2022   No results found for: "GFR"  Plan: Continue metoprolol succinate 50 mg daily.  3. Vitamin D Deficiency Vitamin D is at goal of 50.  Most recent vitamin D level was 35.8. She is on  prescription ergocalciferol 50,000 IU weekly. Lab Results  Component Value Date   VD25OH 35.8 01/21/2023   VD25OH 37.2 09/11/2022   VD25OH 45.3 03/22/2022    Plan: Continue and refill  prescription ergocalciferol 50,000 IU weekly   Morbid Obesity: Current BMI 41  Tracy Graves is currently in the action stage of change. As such, her goal is to continue with weight loss efforts.  She has agreed to keeping a food journal with goal of 1500-1600 calories and 95 grams of protein daily.  1.  Begin journaling once she recovers from her illness.  Exercise goals: Resume exercise when she is feeling better.  Behavioral modification strategies: increasing lean protein intake, decreasing simple carbohydrates , no meal skipping, and planning for success.  Tracy Graves has agreed to follow-up with our clinic in 5 weeks.  No orders of the defined types were placed in this encounter.   Medications Discontinued During This Encounter  Medication Reason   Vitamin D, Ergocalciferol, (DRISDOL) 1.25 MG (50000 UNIT) CAPS capsule Reorder  Meds ordered this encounter  Medications   Vitamin D, Ergocalciferol, (DRISDOL) 1.25 MG (50000 UNIT) CAPS capsule    Sig: Take 1 capsule (50,000 Units total) by mouth every 7 (seven) days.    Dispense:  12 capsule    Refill:  0    Order Specific Question:   Supervising Provider    Answer:   Glennis Brink [[id number]]       Objective:   VITALS: Per patient if applicable, see vitals. GENERAL: Alert and in no acute distress. CARDIOPULMONARY: No increased WOB. Speaking in clear sentences.  PSYCH: Pleasant and cooperative. Speech normal rate and rhythm. Affect is appropriate. Insight and judgement are appropriate. Attention is focused, linear, and appropriate.  NEURO: Oriented as arrived to appointment on time with no prompting.   Attestation Statements:   Reviewed by clinician on day of visit: allergies, medications, problem list, medical history, surgical history, family history, social history, and previous encounter notes.   This was prepared with the assistance of Engineer, civil (consulting).  Occasional wrong-word or sound-a-like substitutions may have occurred due to the inherent limitations of voice recognition software.

## 2023-09-01 ENCOUNTER — Other Ambulatory Visit (INDEPENDENT_AMBULATORY_CARE_PROVIDER_SITE_OTHER): Payer: Self-pay | Admitting: Family Medicine

## 2023-09-01 ENCOUNTER — Telehealth: Payer: Medicaid Other | Admitting: Nurse Practitioner

## 2023-09-01 DIAGNOSIS — J019 Acute sinusitis, unspecified: Secondary | ICD-10-CM | POA: Diagnosis not present

## 2023-09-01 DIAGNOSIS — E1165 Type 2 diabetes mellitus with hyperglycemia: Secondary | ICD-10-CM

## 2023-09-01 DIAGNOSIS — B9689 Other specified bacterial agents as the cause of diseases classified elsewhere: Secondary | ICD-10-CM

## 2023-09-01 DIAGNOSIS — E559 Vitamin D deficiency, unspecified: Secondary | ICD-10-CM

## 2023-09-01 MED ORDER — AMOXICILLIN-POT CLAVULANATE 875-125 MG PO TABS
1.0000 | ORAL_TABLET | Freq: Two times a day (BID) | ORAL | 0 refills | Status: AC
Start: 2023-09-01 — End: 2023-09-08

## 2023-09-01 NOTE — Progress Notes (Signed)
I have spent 5 minutes in review of e-visit questionnaire, review and updating patient chart, medical decision making and response to patient.  ° °Zelda W Fleming, NP ° °  °

## 2023-09-01 NOTE — Progress Notes (Signed)

## 2023-09-30 ENCOUNTER — Ambulatory Visit (INDEPENDENT_AMBULATORY_CARE_PROVIDER_SITE_OTHER): Payer: Medicaid Other | Admitting: Family Medicine

## 2023-09-30 ENCOUNTER — Encounter (INDEPENDENT_AMBULATORY_CARE_PROVIDER_SITE_OTHER): Payer: Self-pay | Admitting: Family Medicine

## 2023-09-30 VITALS — BP 113/72 | HR 78 | Temp 98.3°F | Ht 63.0 in | Wt 235.0 lb

## 2023-09-30 DIAGNOSIS — E669 Obesity, unspecified: Secondary | ICD-10-CM

## 2023-09-30 DIAGNOSIS — E1169 Type 2 diabetes mellitus with other specified complication: Secondary | ICD-10-CM

## 2023-09-30 DIAGNOSIS — E1165 Type 2 diabetes mellitus with hyperglycemia: Secondary | ICD-10-CM | POA: Diagnosis not present

## 2023-09-30 DIAGNOSIS — Z6841 Body Mass Index (BMI) 40.0 and over, adult: Secondary | ICD-10-CM | POA: Diagnosis not present

## 2023-09-30 DIAGNOSIS — Z7985 Long-term (current) use of injectable non-insulin antidiabetic drugs: Secondary | ICD-10-CM

## 2023-09-30 DIAGNOSIS — E785 Hyperlipidemia, unspecified: Secondary | ICD-10-CM

## 2023-09-30 MED ORDER — OZEMPIC (0.25 OR 0.5 MG/DOSE) 2 MG/3ML ~~LOC~~ SOPN
0.2500 mg | PEN_INJECTOR | SUBCUTANEOUS | 0 refills | Status: DC
Start: 2023-09-30 — End: 2023-12-18
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
## 2023-10-07 ENCOUNTER — Other Ambulatory Visit (INDEPENDENT_AMBULATORY_CARE_PROVIDER_SITE_OTHER): Payer: Self-pay | Admitting: Family Medicine

## 2023-10-07 DIAGNOSIS — E785 Hyperlipidemia, unspecified: Secondary | ICD-10-CM

## 2023-10-15 ENCOUNTER — Other Ambulatory Visit: Payer: Self-pay | Admitting: Internal Medicine

## 2023-10-24 DIAGNOSIS — E785 Hyperlipidemia, unspecified: Secondary | ICD-10-CM | POA: Diagnosis not present

## 2023-10-24 DIAGNOSIS — E119 Type 2 diabetes mellitus without complications: Secondary | ICD-10-CM | POA: Diagnosis not present

## 2023-10-24 DIAGNOSIS — D473 Essential (hemorrhagic) thrombocythemia: Secondary | ICD-10-CM | POA: Diagnosis not present

## 2023-10-24 DIAGNOSIS — E039 Hypothyroidism, unspecified: Secondary | ICD-10-CM | POA: Diagnosis not present

## 2023-10-24 DIAGNOSIS — I10 Essential (primary) hypertension: Secondary | ICD-10-CM | POA: Diagnosis not present

## 2023-10-24 DIAGNOSIS — Z720 Tobacco use: Secondary | ICD-10-CM | POA: Diagnosis not present

## 2023-10-24 DIAGNOSIS — D72829 Elevated white blood cell count, unspecified: Secondary | ICD-10-CM | POA: Diagnosis not present

## 2023-11-04 ENCOUNTER — Ambulatory Visit (INDEPENDENT_AMBULATORY_CARE_PROVIDER_SITE_OTHER): Payer: Medicaid Other | Admitting: Internal Medicine

## 2023-11-04 ENCOUNTER — Encounter (INDEPENDENT_AMBULATORY_CARE_PROVIDER_SITE_OTHER): Payer: Self-pay | Admitting: Internal Medicine

## 2023-11-04 VITALS — BP 122/75 | HR 87 | Temp 98.0°F | Ht 63.0 in | Wt 236.0 lb

## 2023-11-04 DIAGNOSIS — E1159 Type 2 diabetes mellitus with other circulatory complications: Secondary | ICD-10-CM | POA: Diagnosis not present

## 2023-11-04 DIAGNOSIS — I152 Hypertension secondary to endocrine disorders: Secondary | ICD-10-CM

## 2023-11-04 DIAGNOSIS — Z7984 Long term (current) use of oral hypoglycemic drugs: Secondary | ICD-10-CM

## 2023-11-04 DIAGNOSIS — G4733 Obstructive sleep apnea (adult) (pediatric): Secondary | ICD-10-CM

## 2023-11-04 DIAGNOSIS — Z6841 Body Mass Index (BMI) 40.0 and over, adult: Secondary | ICD-10-CM | POA: Diagnosis not present

## 2023-11-04 DIAGNOSIS — Z7985 Long-term (current) use of injectable non-insulin antidiabetic drugs: Secondary | ICD-10-CM | POA: Diagnosis not present

## 2023-11-04 DIAGNOSIS — E1169 Type 2 diabetes mellitus with other specified complication: Secondary | ICD-10-CM | POA: Diagnosis not present

## 2023-11-04 NOTE — Assessment & Plan Note (Signed)
HgbA1c is at goal for age and comorbid conditions. Denies symptoms of hypoglycemia or hyperglycemia. On semaglutide 0.25 mg once a week and metformin with good adherence and no side effects.   We reviewed carb insulin model with patient.  Lab Results  Component Value Date   HGBA1C 5.7 (H) 01/21/2023   HGBA1C 5.7 (H) 09/11/2022   HGBA1C 5.5 03/22/2022   Lab Results  Component Value Date   LDLCALC 45 01/04/2023   CREATININE 0.77 01/21/2023   She will continue to maintain a diet low in simple and added sugars.  She will continue semaglutide at current dose.

## 2023-11-04 NOTE — Assessment & Plan Note (Signed)
 See obesity treatment plan

## 2023-11-04 NOTE — Assessment & Plan Note (Signed)
Blood pressure at goal for age and risk category.  On metoprolol without adverse effects.  Medication may cause weight gain.    Continue with weight loss therapy.

## 2023-11-04 NOTE — Assessment & Plan Note (Signed)
Patient describes it as mild I cannot find sleep study.  She was diagnosed with OSA at a higher body weight and has lost a significant amount of weight probably more than 15% of total body weight which may have improved her AHI.  I provided her with a handout with additional information if she still feels she is having symptoms she may benefit from having a home sleep study repeated to see if her sleep apnea is still an issue.

## 2023-11-04 NOTE — Progress Notes (Signed)
Office: [phone]  /  Fax: [phone]  WEIGHT SUMMARY AND BIOMETRICS  Vitals Temp: 98 F (36.7 C) BP: 122/75 Pulse Rate: 87 SpO2: 99 %   Anthropometric Measurements Height: 5\' 3"  (1.6 m) Weight: 236 lb (107 kg) BMI (Calculated): 41.82 Weight at Last Visit: 235 lb Weight Lost Since Last Visit: 0 lb Weight Gained Since Last Visit: 1 lb Starting Weight: 249 lb Total Weight Loss (lbs): 13 lb (5.897 kg)   Body Composition  Body Fat %: 42.2 % Fat Mass (lbs): 99.8 lbs Muscle Mass (lbs): 130 lbs Total Body Water (lbs): 89.6 lbs Visceral Fat Rating : 14    RMR: 42.2  Today's Visit #: 40  Starting Date: 09/12/20   HPI  Chief Complaint: OBESITY  Tracy Graves is here to discuss her progress with her obesity treatment plan. She is on the keeping a food journal and adhering to recommended goals of 1600 calories and 90 protein and states she is following her eating plan approximately 95 % of the time. She states she is exercising 60 minutes 6 times per week.  Interval History:   Discussed the use of AI scribe software for clinical note transcription with the patient, who gave verbal consent to proceed.  History of Present Illness    The patient, with a history of type 2 diabetes, hypertension, sleep apnea, and kidney stones, presents for weight management. She reports an unexpected weight gain of one pound despite increased physical activity and consistent dietary habits. The patient has a significant past medical history of rapid weight gain of 140 pounds within a year, attributed to undiagnosed diabetes and thyroid issues. This weight gain was associated with the onset of sleep apnea. However, after diagnosis and treatment of these conditions, the patient managed to lose 130 pounds over a year and a half through diet modification and increased physical activity.  The patient's weight loss journey has been marked by fluctuations. After an initial significant weight loss, she  experienced a period of weight gain, reaching a peak of 251 pounds in October of the previous year. This was followed by a period of weight loss, which then plateaued at around 235-236 pounds. The patient attributes these fluctuations to stress levels, which she believes significantly impact her weight regardless of diet or exercise regimen.  The patient is currently on Ozempic for diabetes management, which she reports has significantly reduced her appetite, leading to skipped meals. She has experienced gastrointestinal side effects with attempts to increase the dosage. The patient also reports a history of mild sleep apnea, which has improved with weight loss. She no longer experiences episodes of apnea during sleep as reported by her partner. The patient is open to reassessment of her sleep apnea status through a home-based sleep study if necessary.     Orexigenic Control: Denies problems with appetite and hunger signals.  Denies problems with satiety and satiation.  Denies problems with eating patterns and portion control.  Denies abnormal cravings. Denies feeling deprived or restricted.   Barriers identified: presence of obesogenic drugs and sleep apnea.   Pharmacotherapy for weight loss: She is currently taking Ozempic with diabetes as the primary indication with adequate clinical response  and without side effects..    ASSESSMENT AND PLAN  TREATMENT PLAN FOR OBESITY:  Recommended Dietary Goals  [name] is currently in the action stage of change. As such, her goal is to continue weight management plan. She has agreed to: switch to category 3 plan which is 1500 cal 100 and  grams of protein per day.  She is also tracking and journaling I have asked her to try to shoot for 1300 and adjust based on hunger.  We also reviewed balanced plate and increasing plant-based nutrition.  Behavioral Intervention  We discussed the following Behavioral Modification Strategies today: continue to work on  maintaining a reduced calorie state, getting the recommended amount of protein, incorporating whole foods, making healthy choices, staying well hydrated and practicing mindfulness when eating..  Additional resources provided today: None and Handout on symptoms of sleep apnea, health risk and its effect on weight management  Recommended Physical Activity Goals  [name] has been advised to work up to 150 minutes of moderate intensity aerobic activity a week and strengthening exercises 2-3 times per week for cardiovascular health, weight loss maintenance and preservation of muscle mass.   She has agreed to :  Think about enjoyable ways to increase daily physical activity and overcoming barriers to exercise and Increase physical activity in their day and reduce sedentary time (increase NEAT).  Pharmacotherapy We discussed various medication options to help Jessye with her weight loss efforts and we both agreed to : continue current anti-obesity medication regimen  ASSOCIATED CONDITIONS ADDRESSED TODAY  OSA (obstructive sleep apnea) Assessment & Plan: Patient describes it as mild I cannot find sleep study.  She was diagnosed with OSA at a higher body weight and has lost a significant amount of weight probably more than 15% of total body weight which may have improved her AHI.  I provided her with a handout with additional information if she still feels she is having symptoms she may benefit from having a home sleep study repeated to see if her sleep apnea is still an issue.   Hypertension associated with type 2 diabetes mellitus (HCC) Assessment & Plan: Blood pressure at goal for age and risk category.  On metoprolol without adverse effects.  Medication may cause weight gain.    Continue with weight loss therapy.     Type 2 diabetes mellitus with other specified complication, without long-term current use of insulin (HCC) Assessment & Plan: HgbA1c is at goal for age and comorbid conditions.  Denies symptoms of hypoglycemia or hyperglycemia. On semaglutide 0.25 mg once a week and metformin with good adherence and no side effects.   We reviewed carb insulin model with patient.  Lab Results  Component Value Date   HGBA1C 5.7 (H) 01/21/2023   HGBA1C 5.7 (H) 09/11/2022   HGBA1C 5.5 03/22/2022   Lab Results  Component Value Date   LDLCALC 45 01/04/2023   CREATININE 0.77 01/21/2023   She will continue to maintain a diet low in simple and added sugars.  She will continue semaglutide at current dose.    Morbid obesity (HCC) Assessment & Plan: See obesity treatment plan     PHYSICAL EXAM:  Blood pressure 122/75, pulse 87, temperature 98 F (36.7 C), height 5\' 3"  (1.6 m), weight 236 lb (107 kg), SpO2 99%. Body mass index is 41.81 kg/m.  General: She is overweight, cooperative, alert, well developed, and in no acute distress. PSYCH: Has normal mood, affect and thought process.   HEENT: EOMI, sclerae are anicteric. Lungs: Normal breathing effort, no conversational dyspnea. Extremities: No edema.  Neurologic: No gross sensory or motor deficits. No tremors or fasciculations noted.    DIAGNOSTIC DATA REVIEWED:  BMET    Component Value Date/Time   NA 140 01/21/2023 0837   K 5.3 (H) 01/21/2023 0837   CL 103 01/21/2023 4782  CO2 21 01/21/2023 0837   GLUCOSE 107 (H) 01/21/2023 0837   GLUCOSE 114 (H) 10/23/2019 0225   BUN 10 01/21/2023 0837   CREATININE 0.77 01/21/2023 0837   CALCIUM 11.0 (H) 01/21/2023 0837   GFRNONAA 80 02/14/2021 1050   GFRAA 92 02/14/2021 1050   Lab Results  Component Value Date   HGBA1C 5.7 (H) 01/21/2023   HGBA1C 11.2 (H) 06/05/2015   Lab Results  Component Value Date   INSULIN 20.0 01/21/2023   INSULIN 41.6 (H) 09/12/2020   Lab Results  Component Value Date   TSH 1.390 09/11/2022   CBC    Component Value Date/Time   WBC 10.0 03/22/2022 0759   WBC 13.0 (H) 10/23/2019 0225   RBC 5.29 (H) 03/22/2022 0759   RBC 5.48 (A)  10/09/2021 0000   HGB 16.7 (H) 03/22/2022 0759   HCT 49.2 (H) 03/22/2022 0759   PLT 463 (H) 03/22/2022 0759   MCV 93 03/22/2022 0759   MCH 31.6 03/22/2022 0759   MCH 31.8 10/23/2019 0225   MCHC 33.9 03/22/2022 0759   MCHC 33.2 10/23/2019 0225   RDW 12.9 03/22/2022 0759   Iron Studies No results found for: "IRON", "TIBC", "FERRITIN", "IRONPCTSAT" Lipid Panel     Component Value Date/Time   CHOL 120 01/04/2023 0847   TRIG 107 01/04/2023 0847   HDL 55 01/04/2023 0847   CHOLHDL 2.2 01/04/2023 0847   LDLCALC 45 01/04/2023 0847   Hepatic Function Panel     Component Value Date/Time   PROT 6.9 01/21/2023 0837   ALBUMIN 4.3 01/21/2023 0837   AST 12 01/21/2023 0837   ALT 17 01/21/2023 0837   ALKPHOS 140 (H) 01/21/2023 0837   BILITOT 0.4 01/21/2023 0837      Component Value Date/Time   TSH 1.390 09/11/2022 1125   Nutritional Lab Results  Component Value Date   VD25OH 35.8 01/21/2023   VD25OH 37.2 09/11/2022   VD25OH 45.3 03/22/2022     Return in about 4 weeks (around 12/02/2023) for Dr. Lawson Radar.. She was informed of the importance of frequent follow up visits to maximize her success with intensive lifestyle modifications for her multiple health conditions.   ATTESTASTION STATEMENTS:  Reviewed by clinician on day of visit: allergies, medications, problem list, medical history, surgical history, family history, social history, and previous encounter notes.     Worthy Rancher, MD

## 2023-11-08 ENCOUNTER — Other Ambulatory Visit (INDEPENDENT_AMBULATORY_CARE_PROVIDER_SITE_OTHER): Payer: Self-pay | Admitting: Family Medicine

## 2023-11-08 DIAGNOSIS — E1165 Type 2 diabetes mellitus with hyperglycemia: Secondary | ICD-10-CM

## 2023-11-09 ENCOUNTER — Other Ambulatory Visit: Payer: Self-pay | Admitting: Internal Medicine

## 2023-11-22 ENCOUNTER — Other Ambulatory Visit: Payer: Self-pay | Admitting: Internal Medicine

## 2023-12-04 ENCOUNTER — Other Ambulatory Visit: Payer: Self-pay | Admitting: Internal Medicine

## 2023-12-09 ENCOUNTER — Ambulatory Visit (INDEPENDENT_AMBULATORY_CARE_PROVIDER_SITE_OTHER): Payer: Medicaid Other | Admitting: Family Medicine
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
## 2023-12-16 ENCOUNTER — Ambulatory Visit: Payer: Medicaid Other | Admitting: Nurse Practitioner

## 2023-12-18 ENCOUNTER — Encounter (INDEPENDENT_AMBULATORY_CARE_PROVIDER_SITE_OTHER): Payer: Self-pay | Admitting: Family Medicine

## 2023-12-18 ENCOUNTER — Ambulatory Visit (INDEPENDENT_AMBULATORY_CARE_PROVIDER_SITE_OTHER): Payer: Medicaid Other | Admitting: Family Medicine

## 2023-12-18 VITALS — BP 111/70 | HR 82 | Temp 98.7°F | Ht 63.0 in | Wt 235.0 lb

## 2023-12-18 DIAGNOSIS — E559 Vitamin D deficiency, unspecified: Secondary | ICD-10-CM

## 2023-12-18 DIAGNOSIS — E66813 Morbid (severe) obesity due to excess calories: Secondary | ICD-10-CM

## 2023-12-18 DIAGNOSIS — Z7985 Long-term (current) use of injectable non-insulin antidiabetic drugs: Secondary | ICD-10-CM | POA: Diagnosis not present

## 2023-12-18 DIAGNOSIS — E1165 Type 2 diabetes mellitus with hyperglycemia: Secondary | ICD-10-CM | POA: Diagnosis not present

## 2023-12-18 DIAGNOSIS — E669 Obesity, unspecified: Secondary | ICD-10-CM

## 2023-12-18 DIAGNOSIS — Z6841 Body Mass Index (BMI) 40.0 and over, adult: Secondary | ICD-10-CM | POA: Diagnosis not present

## 2023-12-18 MED ORDER — OZEMPIC (0.25 OR 0.5 MG/DOSE) 2 MG/3ML ~~LOC~~ SOPN
0.2500 mg | PEN_INJECTOR | SUBCUTANEOUS | 0 refills | Status: DC
Start: 2023-12-18 — End: 2024-02-03

## 2023-12-18 MED ORDER — VITAMIN D (ERGOCALCIFEROL) 1.25 MG (50000 UNIT) PO CAPS
50000.0000 [IU] | ORAL_CAPSULE | ORAL | 0 refills | Status: DC
Start: 2023-12-18 — End: 2024-03-09

## 2023-12-18 NOTE — Progress Notes (Signed)
SUBJECTIVE:  Chief Complaint: Obesity  Interim History: Tracy Graves's last appointment had to be rescheduled due to her driveway gettng torn up.  She brought in her food journal.  She does not feel like she is able to get all the calories in and get all her nutrition in.  Upon review of her journal she is eating very nutritious choices but struggles with low calories even though her protein is all in. She has questions about whether or not she can eat more calories if she has eaten all her protein just to hit goal calories.  She is going out for her birthday tonight.   Tracy Graves is here to discuss her progress with her obesity treatment plan. She is on the Category 3 Plan and keeping a food journal and adhering to recommended goals of 1500 calories and 100 grams of protein and states she is following her eating plan approximately 100 % of the time. She states she is exercising 90 minutes 5 times per week.   OBJECTIVE: Visit Diagnoses: Problem List Items Addressed This Visit       Endocrine   Diabetes mellitus (HCC)   Tracy Graves is doing well on Ozempic.  She is stuck at the lower range dose due to GI side effects like nausea.  She needs refill today. Last labs done in January 2024 in Epic.  She had labs done in September or October and so she isn't due for labs currently.  We discussed increasing total calories and keeping protein as is.      Relevant Medications   Semaglutide,0.25 or 0.5MG /DOS, (OZEMPIC, 0.25 OR 0.5 MG/DOSE,) 2 MG/3ML SOPN     Other   Vitamin D deficiency   Tracy Graves is on Rx Vitamin D.  Unclear is Vitamin D level was ordered by PCP.  Needs refill of Rx Vit D today.  Discussed importance of vitamin d supplementation.  Vitamin d supplementation has been shown to decrease fatigue, decrease risk of progression to insulin resistance and then prediabetes, decreases risk of falling in older age and can even assist in decreasing depressive symptoms in PTSD.   Prescription for Vitamin D sent  in.        Relevant Medications   Vitamin D, Ergocalciferol, (DRISDOL) 1.25 MG (50000 UNIT) CAPS capsule    Vitals Temp: 98.7 F (37.1 C) BP: 111/70 Pulse Rate: 82 SpO2: 95 %   Anthropometric Measurements Height: 5\' 3"  (1.6 m) Weight: 235 lb (106.6 kg) BMI (Calculated): 41.64 Weight at Last Visit: 236 lb Weight Lost Since Last Visit: 1 Weight Gained Since Last Visit: 0 Starting Weight: 249 lb Total Weight Loss (lbs): 14 lb (6.35 kg)   Body Composition  Body Fat %: 50.4 % Fat Mass (lbs): 118.8 lbs Muscle Mass (lbs): 111 lbs Total Body Water (lbs): 83.2 lbs Visceral Fat Rating : 17   Other Clinical Data Today's Visit #: 62 Starting Date: 09/12/20     ASSESSMENT AND PLAN:  Diet: Javaya is currently in the action stage of change. As such, her goal is to continue with weight loss efforts. She has agreed to Category 3 Plan or 1500 calories and 100 or more grams of protein daily.  Exercise: [name] has been instructed to work up to a goal of 150 minutes of combined cardio and strengthening exercise per week for weight loss and overall health benefits.   Behavior Modification:  We discussed the following Behavioral Modification Strategies today: increasing lean protein intake, increasing vegetables, meal planning and cooking strategies, better snacking  choices, holiday eating strategies, avoiding temptations, and planning for success.   No follow-ups on file.Marland Kitchen She was informed of the importance of frequent follow up visits to maximize her success with intensive lifestyle modifications for her multiple health conditions.  Attestation Statements:   Reviewed by clinician on day of visit: allergies, medications, problem list, medical history, surgical history, family history, social history, and previous encounter notes.   [name], MD

## 2023-12-18 NOTE — Assessment & Plan Note (Signed)
Patient is doing well on Ozempic.  She is stuck at the lower range dose due to GI side effects like nausea.  She needs refill today. Last labs done in January 2024 in Epic.  She had labs done in September or October and so she isn't due for labs currently.  We discussed increasing total calories and keeping protein as is.

## 2023-12-18 NOTE — Assessment & Plan Note (Signed)
Patient is on Rx Vitamin D.  Unclear is Vitamin D level was ordered by PCP.  Needs refill of Rx Vit D today.  Discussed importance of vitamin d supplementation.  Vitamin d supplementation has been shown to decrease fatigue, decrease risk of progression to insulin resistance and then prediabetes, decreases risk of falling in older age and can even assist in decreasing depressive symptoms in PTSD.   Prescription for Vitamin D sent in.

## 2023-12-19 ENCOUNTER — Other Ambulatory Visit (INDEPENDENT_AMBULATORY_CARE_PROVIDER_SITE_OTHER): Payer: Self-pay | Admitting: Family Medicine

## 2023-12-19 DIAGNOSIS — E1165 Type 2 diabetes mellitus with hyperglycemia: Secondary | ICD-10-CM

## 2024-01-03 ENCOUNTER — Other Ambulatory Visit: Payer: Self-pay | Admitting: Internal Medicine

## 2024-01-09 NOTE — Progress Notes (Signed)
 Cardiology Office Note    Patient Name: Tracy Graves Date of Encounter: 01/09/2024  Primary Care Provider:  Patient, No Pcp Per Primary Cardiologist:  Stanly DELENA Leavens, MD Primary Electrophysiologist: None   Past Medical History    Past Medical History:  Diagnosis Date   Abdominal hernia    Abscess    chest wall, right breast   Cellulitis    Diabetes (HCC)    History of kidney stones    Hyperglycemia due to type 2 diabetes mellitus (HCC) 06/05/2015   Hypertension    Hypothyroid    2016   Intertrigo    Morbid obesity due to excess calories (HCC) 06/05/2015   Multiple food allergies    Shortness of breath    Tobacco use     History of Present Illness  Tracy Graves is a 63 y.o. female with a PMH of HTN, DM type II, hypothyroid, tobacco abuse, OSA, aortic atherosclerosis, morbid obesity who presents today for 1 year follow-up.  [name] was seen initially by Dr. Arnetha on 08/2020 for complaint of SOB. She reported atypical chest pain at visit and nuclear stress test was ordered for further evaluation that was normal with no evidence of ischemia.  She also completed an updated TTE that showed no evidence of HCM with EF of 65 to 70% with mild concentric LVH and no RWMA with grade 1 DD and no significant valve abnormalities.  She is currently followed by [facility] and was last seen by Dr. Arnetha on 10/15/2022 for follow-up.  During visit patient reported doing okay with no new cardiac complaints.  She had adjustments to her atorvastatin  in order to get to LDL goal and was continuing to work with choosing a quit date for tobacco cessation.  Ms. Sublette presents today for 1 year follow-up.  She reports doing well overall and tolerating their atorvastatin  well. Their cholesterol levels have improved significantly, with LDL at 45, HDL at 55, and triglycerides within normal limits.  She continues to be seen at [facility] great A1c and  kidney function per most recent lab work.  She reports being on a plateau and are brainstorming different ways to jumpstart their weight loss again. They are on the lowest dose of Ozempic  due to previous stomach issues. They express concern about the medication's effect on their heart and I informed her that it actually helps to reduce her cardiovascular risk.  She is also trying to quit smoking and has not had a cigarette in five days. They are attempting to quit cold turkey and are finding it challenging.  Patient denies chest pain, palpitations, dyspnea, PND, orthopnea, nausea, vomiting, dizziness, syncope, edema, weight gain, or early satiety.  Review of Systems  Please see the history of present illness.    All other systems reviewed and are otherwise negative except as noted above.  Physical Exam    Wt Readings from Last 3 Encounters:  12/18/23 235 lb (106.6 kg)  11/04/23 236 lb (107 kg)  09/30/23 235 lb (106.6 kg)   CD:Uyzmz were no vitals filed for this visit.,There is no height or weight on file to calculate BMI. GEN: Well nourished, well developed in no acute distress Neck: No JVD; No carotid bruits Pulmonary: Clear to auscultation without rales, wheezing or rhonchi  Cardiovascular: Normal rate. Regular rhythm. Normal S1. Normal S2.   Murmurs: There is no murmur.  ABDOMEN: Soft, non-tender, non-distended EXTREMITIES:  No edema; No deformity   EKG/LABS/ Recent  Cardiac Studies   ECG personally reviewed by me today -sinus rhythm with incomplete RBBB and rate of 73 bpm with no acute changes consistent with previous EKG.  Risk Assessment/Calculations:          Lab Results  Component Value Date   WBC 10.0 03/22/2022   HGB 16.7 (H) 03/22/2022   HCT 49.2 (H) 03/22/2022   MCV 93 03/22/2022   PLT 463 (H) 03/22/2022   Lab Results  Component Value Date   CREATININE 0.77 01/21/2023   BUN 10 01/21/2023   NA 140 01/21/2023   K 5.3 (H) 01/21/2023   CL 103 01/21/2023   CO2 21  01/21/2023   Lab Results  Component Value Date   CHOL 120 01/04/2023   HDL 55 01/04/2023   LDLCALC 45 01/04/2023   TRIG 107 01/04/2023   CHOLHDL 2.2 01/04/2023    Lab Results  Component Value Date   HGBA1C 5.7 (H) 01/21/2023   Assessment & Plan    1.  Essential hypertension: -Patient's BP today was well-controlled at 128/82 -Continue Toprol-XL 50 mg daily  2.  Hyperlipidemia: -Patient's last LDL cholesterol was well-controlled at 45 -Continue Lipitor 20 mg daily  3.  Aortic atherosclerosis: -Continue current GDMT with Lipitor 20 mg daily and blood pressure control.  4.  Morbid obesity: -Patient's BMI is 45.51 kg/m -Patient is currently followed by [facility] and is using Ozempic   5.  Tobacco abuse: -Today patient reports that she is currently 5 days without a cigarette using cold turkey method. -I advised her to reach out to 1 800 quit NOW for further encouragement and resources.  6.  Diabetes type 2: -Patient's most recent hemoglobin A1c was 5.7 -Continue current DM regimen with Glucophage  500 mg daily and Ozempic   7.  Incomplete RBBB Noted on EKG, likely benign but requires monitoring. No symptoms of shortness of breath or fatigue. -Monitor for progression or development of symptoms.   Disposition: Follow-up with Stanly DELENA Leavens, MD or APP in 12 months    Signed, Wyn Raddle, Jackee Shove, NP 01/09/2024, 11:06 AM [facility]

## 2024-01-10 ENCOUNTER — Encounter: Payer: Self-pay | Admitting: Nurse Practitioner

## 2024-01-10 ENCOUNTER — Ambulatory Visit: Payer: Medicaid Other | Attending: Nurse Practitioner | Admitting: Nurse Practitioner

## 2024-01-10 VITALS — BP 128/82 | HR 73 | Ht 63.0 in | Wt 240.0 lb

## 2024-01-10 DIAGNOSIS — I10 Essential (primary) hypertension: Secondary | ICD-10-CM

## 2024-01-10 DIAGNOSIS — E782 Mixed hyperlipidemia: Secondary | ICD-10-CM | POA: Diagnosis not present

## 2024-01-10 DIAGNOSIS — I4510 Unspecified right bundle-branch block: Secondary | ICD-10-CM

## 2024-01-10 DIAGNOSIS — I700 Atherosclerosis of aorta: Secondary | ICD-10-CM | POA: Diagnosis not present

## 2024-01-10 DIAGNOSIS — E1169 Type 2 diabetes mellitus with other specified complication: Secondary | ICD-10-CM | POA: Diagnosis not present

## 2024-01-10 DIAGNOSIS — Z720 Tobacco use: Secondary | ICD-10-CM | POA: Diagnosis not present

## 2024-01-10 MED ORDER — ATORVASTATIN CALCIUM 20 MG PO TABS: 20.0000 mg | ORAL_TABLET | Freq: Every day | ORAL | 3 refills | Status: DC

## 2024-01-10 NOTE — Patient Instructions (Signed)
 Medication Instructions:   Your physician recommends that you continue on your current medications as directed. Please refer to the Current Medication list given to you today.   *If you need a refill on your cardiac medications before your next appointment, please call your pharmacy*   Lab Work:  None ordered.  If you have labs (blood work) drawn today and your tests are completely normal, you will receive your results only by: MyChart Message (if you have MyChart) OR A paper copy in the mail If you have any lab test that is abnormal or we need to change your treatment, we will call you to review the results.   Testing/Procedures:  None ordered.   Follow-Up: At [facility], you and your health needs are our priority.  As part of our continuing mission to provide you with exceptional heart care, we have created designated Provider Care Teams.  These Care Teams include your primary Cardiologist (physician) and Advanced Practice Providers (APPs -  Physician Assistants and Nurse Practitioners) who all work together to provide you with the care you need, when you need it.  We recommend signing up for the patient portal called MyChart.  Sign up information is provided on this After Visit Summary.  MyChart is used to connect with patients for Virtual Visits (Telemedicine).  Patients are able to view lab/test results, encounter notes, upcoming appointments, etc.  Non-urgent messages can be sent to your provider as well.   To learn more about what you can do with MyChart, go to [email or web address].    Your next appointment:   1 year(s)  Provider:   Stanly DELENA Leavens, MD     Other Instructions  Adopting a Healthy Lifestyle.   Weight: Know what a healthy weight is for you (roughly BMI <25) and aim to maintain this. You can calculate your body mass index on your smart phone. Unfortunately, this is not the most accurate measure of healthy weight, but it is the simplest  measurement to use. A more accurate measurement involves body scanning which measures lean muscle, fat tissue and bony density. We do not have this equipment at [facility].    Diet: Aim for 7+ servings of fruits and vegetables daily Limit animal fats in diet for cholesterol and heart health - choose grass fed whenever available Avoid highly processed foods (fast food burgers, tacos, fried chicken, pizza, hot dogs, Nordby fries)  Saturated fat comes in the form of butter, lard, coconut oil, margarine, partially hydrogenated oils, and fat in meat. These increase your risk of cardiovascular disease.  Use healthy plant oils, such as olive, canola, soy, corn, sunflower and peanut.  Whole foods such as fruits, vegetables and whole grains have fiber  Men need > 38 grams of fiber per day Women need > 25 grams of fiber per day  Load up on vegetables and fruits - one-half of your plate: Aim for color and variety, and remember that potatoes dont count. Go for whole grains - one-quarter of your plate: Whole wheat, barley, wheat berries, quinoa, oats, brown rice, and foods made with them. If you want pasta, go with whole wheat pasta. Protein power - one-quarter of your plate: Fish, chicken, beans, and nuts are all healthy, versatile protein sources. Limit red meat. You need carbohydrates for energy! The type of carbohydrate is more important than the amount. Choose carbohydrates such as vegetables, fruits, whole grains, beans, and nuts in the place of white rice, white pasta, potatoes (baked or fried), macaroni and cheese,  cakes, cookies, and donuts.  If youre thirsty, drink water. Coffee and tea are good in moderation, but skip sugary drinks and limit milk and dairy products to one or two daily servings. Keep sugar intake at 6 teaspoons or 24 grams or LESS       Exercise: Aim for 150 min of moderate intensity exercise weekly for heart health, and weights twice weekly for bone health Stay active - any steps  are better than no steps! Aim for 7-9 hours of sleep daily    1-800-Quit NOW Try Mucinex and Flonase for sinus.       1st Floor: - Lobby - Registration  - Pharmacy  - Lab - Cafe  2nd Floor: - PV Lab - Diagnostic Testing (echo, CT, nuclear med)  3rd Floor: - Vacant  4th Floor: - TCTS (cardiothoracic surgery) - AFib Clinic - Structural Heart Clinic - Vascular Surgery  - Vascular Ultrasound  5th Floor: - HeartCare Cardiology (general and EP) - Clinical Pharmacy for coumadin, hypertension, lipid, weight-loss medications, and med management appointments    Valet parking services will be available as well.

## 2024-01-27 ENCOUNTER — Other Ambulatory Visit (INDEPENDENT_AMBULATORY_CARE_PROVIDER_SITE_OTHER): Payer: Self-pay | Admitting: Family Medicine

## 2024-01-27 DIAGNOSIS — E1165 Type 2 diabetes mellitus with hyperglycemia: Secondary | ICD-10-CM

## 2024-01-28 DIAGNOSIS — E039 Hypothyroidism, unspecified: Secondary | ICD-10-CM | POA: Diagnosis not present

## 2024-02-03 ENCOUNTER — Ambulatory Visit (INDEPENDENT_AMBULATORY_CARE_PROVIDER_SITE_OTHER): Payer: Medicaid Other | Admitting: Family Medicine

## 2024-02-03 ENCOUNTER — Encounter (INDEPENDENT_AMBULATORY_CARE_PROVIDER_SITE_OTHER): Payer: Self-pay | Admitting: Family Medicine

## 2024-02-03 VITALS — BP 104/68 | HR 80 | Temp 98.2°F | Ht 63.0 in | Wt 237.0 lb

## 2024-02-03 DIAGNOSIS — Z6841 Body Mass Index (BMI) 40.0 and over, adult: Secondary | ICD-10-CM

## 2024-02-03 DIAGNOSIS — Z7985 Long-term (current) use of injectable non-insulin antidiabetic drugs: Secondary | ICD-10-CM | POA: Diagnosis not present

## 2024-02-03 DIAGNOSIS — E039 Hypothyroidism, unspecified: Secondary | ICD-10-CM

## 2024-02-03 DIAGNOSIS — E669 Obesity, unspecified: Secondary | ICD-10-CM | POA: Diagnosis not present

## 2024-02-03 DIAGNOSIS — E1165 Type 2 diabetes mellitus with hyperglycemia: Secondary | ICD-10-CM

## 2024-02-03 DIAGNOSIS — E119 Type 2 diabetes mellitus without complications: Secondary | ICD-10-CM

## 2024-02-03 MED ORDER — OZEMPIC (0.25 OR 0.5 MG/DOSE) 2 MG/3ML ~~LOC~~ SOPN: 0.5000 mg | PEN_INJECTOR | SUBCUTANEOUS | 0 refills | Status: DC

## 2024-02-03 NOTE — Assessment & Plan Note (Signed)
Patient reports her thyroid level was close to hyperthyroid level on last check. She mentions no changes were made to here medication level but that that she will be retested in 6 weeks to see if any adjustments need to be made.

## 2024-02-03 NOTE — Assessment & Plan Note (Signed)
Patient on Ozempic weekly and is wondering if she can go up to 0.5mg  dose.  She will increase to 0.5mg  dose but will continue with same calorie and protein allotment.  She needs a refill of the Ozempic today.

## 2024-02-03 NOTE — Progress Notes (Signed)
SUBJECTIVE:  Chief Complaint: Obesity  Interim History: Patient had a great holiday season- she was busy.  She stayed local for the holidays and her birthday.  For the month of January she has been trying to stay warm and has been crafting and sewing.  She has been trying to stay close to her meal plan.  Her daughter has been very sick as has her husband.  She unfortunately forgot her food log today. Calorie wise between 1300-1400 and getting protein in around 95 grams for the day.  Tracy Graves Graves is here to discuss her progress with her obesity treatment plan. She is on the Category 3 Plan and keeping a food journal and adhering to recommended goals of 1500 calories and 100 grams of protein and states she is following her eating plan approximately 90 % of the time. She states she is exercising 90 minutes 5 times per week.   OBJECTIVE: Visit Diagnoses: Problem List Items Addressed This Visit       Endocrine   Diabetes mellitus (HCC)   Patient on Ozempic weekly and is wondering if she can go up to 0.5mg  dose.  She will increase to 0.5mg  dose but will continue with same calorie and protein allotment.  She needs a refill of the Ozempic today.      Relevant Medications   Semaglutide,0.25 or 0.5MG /DOS, (OZEMPIC, 0.25 OR 0.5 MG/DOSE,) 2 MG/3ML SOPN   Hypothyroidism, acquired - Primary   Patient reports her thyroid level was close to hyperthyroid level on last check. She mentions no changes were made to here medication level but that that she will be retested in 6 weeks to see if any adjustments need to be made.        Vitals Temp: 98.2 F (36.8 C) BP: 104/68 Pulse Rate: 80 SpO2: 95 %   Anthropometric Measurements Height: 5\' 3"  (1.6 m) Weight: 237 lb (107.5 kg) BMI (Calculated): 41.99 Weight at Last Visit: 235 lb Weight Lost Since Last Visit: 0 Weight Gained Since Last Visit: 2 Starting Weight: 249 lb Total Weight Loss (lbs): 12 lb (5.443 kg)   Body Composition  Body Fat %: 444.2  % Fat Mass (lbs): 104.8 lbs Muscle Mass (lbs): 125.8 lbs Total Body Water (lbs): 84.6 lbs Visceral Fat Rating : 15   Other Clinical Data Today's Visit #: 16 Starting Date: 09/12/20     ASSESSMENT AND PLAN:  Diet: Tracy Graves Graves is currently in the action stage of change. As such, her goal is to continue with weight loss efforts. She has agreed to keeping a food journal and adhering to recommended goals of 1300-1400 calories and 90 or more grams of protein daily. Patient to start food log or journaling meal plan.  The initial goal will be to habitually log or journal for at least 4 days a week.  The expectation it that patient may not initially meet calorie or protein goals as the nturitional understanding of food intake is begun.  We discussed the 10:1 ratio when reading a food label.  Patient agrees to keep a food log either electronically or on paper and bring to the next appointment to be able to dissect and discuss it with provider.    Exercise: Tracy Graves Graves has been instructed to work up to a goal of 150 minutes of combined cardio and strengthening exercise per week and to continue exercising as is for weight loss and overall health benefits.   Behavior Modification:  We discussed the following Behavioral Modification Strategies today: increasing lean protein intake,  increasing vegetables, meal planning and cooking strategies, keeping healthy foods in the home, and keep a strict food journal.  No follow-ups on file.Marland Kitchen She was informed of the importance of frequent follow up visits to maximize her success with intensive lifestyle modifications for her multiple health conditions.  Attestation Statements:   Reviewed by clinician on day of visit: allergies, medications, problem list, medical history, surgical history, family history, social history, and previous encounter notes.   Tracy Graves Likes, MD

## 2024-02-29 ENCOUNTER — Other Ambulatory Visit (INDEPENDENT_AMBULATORY_CARE_PROVIDER_SITE_OTHER): Payer: Self-pay | Admitting: Family Medicine

## 2024-02-29 DIAGNOSIS — E1165 Type 2 diabetes mellitus with hyperglycemia: Secondary | ICD-10-CM

## 2024-03-09 ENCOUNTER — Encounter (INDEPENDENT_AMBULATORY_CARE_PROVIDER_SITE_OTHER): Payer: Self-pay | Admitting: Family Medicine

## 2024-03-09 ENCOUNTER — Ambulatory Visit (INDEPENDENT_AMBULATORY_CARE_PROVIDER_SITE_OTHER): Payer: Medicaid Other | Admitting: Family Medicine

## 2024-03-09 ENCOUNTER — Other Ambulatory Visit (INDEPENDENT_AMBULATORY_CARE_PROVIDER_SITE_OTHER): Payer: Self-pay | Admitting: Family Medicine

## 2024-03-09 VITALS — BP 127/85 | HR 74 | Temp 97.8°F | Ht 63.0 in | Wt 235.0 lb

## 2024-03-09 DIAGNOSIS — Z7985 Long-term (current) use of injectable non-insulin antidiabetic drugs: Secondary | ICD-10-CM

## 2024-03-09 DIAGNOSIS — E1165 Type 2 diabetes mellitus with hyperglycemia: Secondary | ICD-10-CM

## 2024-03-09 DIAGNOSIS — E039 Hypothyroidism, unspecified: Secondary | ICD-10-CM | POA: Diagnosis not present

## 2024-03-09 DIAGNOSIS — E559 Vitamin D deficiency, unspecified: Secondary | ICD-10-CM | POA: Diagnosis not present

## 2024-03-09 DIAGNOSIS — E1169 Type 2 diabetes mellitus with other specified complication: Secondary | ICD-10-CM | POA: Diagnosis not present

## 2024-03-09 DIAGNOSIS — Z6841 Body Mass Index (BMI) 40.0 and over, adult: Secondary | ICD-10-CM | POA: Diagnosis not present

## 2024-03-09 DIAGNOSIS — E785 Hyperlipidemia, unspecified: Secondary | ICD-10-CM

## 2024-03-09 MED ORDER — VITAMIN D (ERGOCALCIFEROL) 1.25 MG (50000 UNIT) PO CAPS: 50000.0000 [IU] | ORAL_CAPSULE | ORAL | 0 refills | Status: DC

## 2024-03-09 MED ORDER — OZEMPIC (0.25 OR 0.5 MG/DOSE) 2 MG/3ML ~~LOC~~ SOPN: 0.5000 mg | PEN_INJECTOR | SUBCUTANEOUS | 0 refills | Status: DC

## 2024-03-09 NOTE — Progress Notes (Signed)
 SUBJECTIVE:  Chief Complaint: Obesity  Interim History: Patient has been getting out more and more and doing yardwork.  She is walking more due to the weather being nicer.  She is wanting to get more yard work in over the next few weeks.  Has been logging consistently and forgot her food journal today. She has been getting in less calories over the last few weeks due to the higher dose of Ozempic.  Estimated that she is getting 1100-1300 calories.   Tracy Graves is here to discuss her progress with her obesity treatment plan. She is on the keeping a food journal and adhering to recommended goals of 1300-1400 calories and 90 grams of protein and states she is following her eating plan approximately 100 % of the time. She states she is exercising 20-30 minutes 3-5 times per week.   OBJECTIVE: Visit Diagnoses: Problem List Items Addressed This Visit       Endocrine   Diabetes mellitus (HCC)   Patient has been working on control of her macronutrient intake.  She is on ozempic to assist with blood sugar control.  Needs a refill of Ozempic today and will repeat labs of CMP, A1c and Insulin level.      Relevant Medications   Semaglutide,0.25 or 0.5MG /DOS, (OZEMPIC, 0.25 OR 0.5 MG/DOSE,) 2 MG/3ML SOPN   Other Relevant Orders   Comprehensive metabolic panel (Completed)   Hemoglobin A1c (Completed)   Insulin, random (Completed)   Hypothyroidism, acquired - Primary   Last TSH within normal limits.  Needs a repeat check today.      Relevant Orders   TSH (Completed)   Hyperlipidemia associated with type 2 diabetes mellitus [facility])   Patient previously saw cardiology for lipid management assessment.  She is doing well on lipitor daily.  She needs a repeat FLP today.  Will plan to discuss labs at next appointment with her.      Relevant Medications   Semaglutide,0.25 or 0.5MG /DOS, (OZEMPIC, 0.25 OR 0.5 MG/DOSE,) 2 MG/3ML SOPN   Other Relevant Orders   Lipid Panel With LDL/HDL Ratio (Completed)      Other   Morbid obesity (HCC)   Anthropometric Measurements Height: 5\' 3"  (1.6 m) Weight: 235 lb (106.6 kg) BMI (Calculated): 41.64 Weight at Last Visit: 237 lb Weight Lost Since Last Visit: 2 Weight Gained Since Last Visit: 0 Starting Weight: 249 lb Total Weight Loss (lbs): 14 lb (6.35 kg) Body Composition  Body Fat %: 51.2 % Fat Mass (lbs): 120.8 lbs Muscle Mass (lbs): 109.2 lbs Total Body Water (lbs): 83.2 lbs Visceral Fat Rating : 17 Other Clinical Data Fasting: yes Labs: yes Today's Visit #: 43 Starting Date: 09/12/20 Comments: 1300-1400/90       Relevant Medications   Semaglutide,0.25 or 0.5MG /DOS, (OZEMPIC, 0.25 OR 0.5 MG/DOSE,) 2 MG/3ML SOPN   Vitamin D deficiency   Needs refill of Vitamin D today and will get repeat Vitamin D level to assess response to supplementation.      Relevant Medications   Vitamin D, Ergocalciferol, (DRISDOL) 1.25 MG (50000 UNIT) CAPS capsule   Other Relevant Orders   VITAMIN D 25 Hydroxy (Vit-D Deficiency, Fractures) (Completed)    No data recorded      03/09/2024    8:00 AM 02/03/2024    3:00 PM 01/10/2024    8:42 AM  Vitals with BMI  Height 5\' 3"  5\' 3"  5\' 3"   Weight 235 lbs 237 lbs 240 lbs  BMI 41.64 41.99 42.52  Systolic 127 104 409  Diastolic 85 68 82  Pulse 74 80 73       ASSESSMENT AND PLAN:  Diet: Adore is currently in the action stage of change. As such, her goal is to continue with weight loss efforts and has agreed to keeping a food journal and adhering to recommended goals of 1100-1300 calories and 90 or more grams protein daily.  Patient to start food log or journaling meal plan.  The initial goal will be to habitually log or journal for at least 4 days a week.  The expectation it that patient may not initially meet calorie or protein goals as the nturitional understanding of food intake is begun.  We discussed the 10:1 ratio when reading a food label.  Patient agrees to keep a food log either electronically or  on paper and bring to the next appointment to be able to dissect and discuss it with provider.    Exercise:  For substantial health benefits, adults should do at least 150 minutes (2 hours and 30 minutes) a week of moderate-intensity, or 75 minutes (1 hour and 15 minutes) a week of vigorous-intensity aerobic physical activity, or an equivalent combination of moderate- and vigorous-intensity aerobic activity. Aerobic activity should be performed in episodes of at least 10 minutes, and preferably, it should be spread throughout the week.  Behavior Modification:  We discussed the following Behavioral Modification Strategies today: increasing lean protein intake, increasing vegetables, meal planning and cooking strategies, planning for success, and keep a strict food journal.   No follow-ups on file.Marland Kitchen She was informed of the importance of frequent follow up visits to maximize her success with intensive lifestyle modifications for her multiple health conditions.  Attestation Statements:   Reviewed by clinician on day of visit: allergies, medications, problem list, medical history, surgical history, family history, social history, and previous encounter notes.    [name], MD

## 2024-03-10 LAB — COMPREHENSIVE METABOLIC PANEL
ALT: 15 IU/L (ref 0–32)
AST: 16 IU/L (ref 0–40)
Albumin: 4.3 g/dL (ref 3.9–4.9)
Alkaline Phosphatase: 147 IU/L — ABNORMAL HIGH (ref 44–121)
BUN/Creatinine Ratio: 16 (ref 12–28)
BUN: 15 mg/dL (ref 8–27)
Bilirubin Total: 0.4 mg/dL (ref 0.0–1.2)
CO2: 23 mmol/L (ref 20–29)
Calcium: 11.1 mg/dL — ABNORMAL HIGH (ref 8.7–10.3)
Chloride: 102 mmol/L (ref 96–106)
Creatinine, Ser: 0.94 mg/dL (ref 0.57–1.00)
Globulin, Total: 2.6 g/dL (ref 1.5–4.5)
Glucose: 86 mg/dL (ref 70–99)
Potassium: 5.2 mmol/L (ref 3.5–5.2)
Sodium: 138 mmol/L (ref 134–144)
Total Protein: 6.9 g/dL (ref 6.0–8.5)
eGFR: 69 mL/min/{1.73_m2} (ref >59)

## 2024-03-10 LAB — HEMOGLOBIN A1C
Est. average glucose Bld gHb Est-mCnc: 114 mg/dL
Hgb A1c MFr Bld: 5.6 % (ref 4.8–5.6)

## 2024-03-10 LAB — TSH: TSH: 0.52 u[IU]/mL (ref 0.450–4.500)

## 2024-03-10 LAB — LIPID PANEL WITH LDL/HDL RATIO
Cholesterol, Total: 116 mg/dL (ref 100–199)
HDL: 56 mg/dL (ref >39)
LDL Chol Calc (NIH): 41 mg/dL (ref 0–99)
LDL/HDL Ratio: 0.7 ratio (ref 0.0–3.2)
Triglycerides: 105 mg/dL (ref 0–149)
VLDL Cholesterol Cal: 19 mg/dL (ref 5–40)

## 2024-03-10 LAB — VITAMIN D 25 HYDROXY (VIT D DEFICIENCY, FRACTURES): Vit D, 25-Hydroxy: 54.2 ng/mL (ref 30.0–100.0)

## 2024-03-10 LAB — INSULIN, RANDOM: INSULIN: 16.8 u[IU]/mL (ref 2.6–24.9)

## 2024-03-13 DIAGNOSIS — E039 Hypothyroidism, unspecified: Secondary | ICD-10-CM | POA: Diagnosis not present

## 2024-03-17 NOTE — Assessment & Plan Note (Signed)
 Needs refill of Vitamin D today and will get repeat Vitamin D level to assess response to supplementation.

## 2024-03-17 NOTE — Assessment & Plan Note (Signed)
 Anthropometric Measurements Height: 5\' 3"  (1.6 m) Weight: 235 lb (106.6 kg) BMI (Calculated): 41.64 Weight at Last Visit: 237 lb Weight Lost Since Last Visit: 2 Weight Gained Since Last Visit: 0 Starting Weight: 249 lb Total Weight Loss (lbs): 14 lb (6.35 kg) Body Composition  Body Fat %: 51.2 % Fat Mass (lbs): 120.8 lbs Muscle Mass (lbs): 109.2 lbs Total Body Water (lbs): 83.2 lbs Visceral Fat Rating : 17 Other Clinical Data Fasting: yes Labs: yes Today's Visit #: 43 Starting Date: 09/12/20 Comments: 1300-1400/90

## 2024-03-17 NOTE — Assessment & Plan Note (Signed)
 Patient previously saw cardiology for lipid management assessment.  She is doing well on lipitor daily.  She needs a repeat FLP today.  Will plan to discuss labs at next appointment with her.

## 2024-03-17 NOTE — Assessment & Plan Note (Signed)
 Patient has been working on control of her macronutrient intake.  She is on ozempic to assist with blood sugar control.  Needs a refill of Ozempic today and will repeat labs of CMP, A1c and Insulin level.

## 2024-03-17 NOTE — Assessment & Plan Note (Signed)
 Last TSH within normal limits.  Needs a repeat check today.

## 2024-03-30 ENCOUNTER — Telehealth (INDEPENDENT_AMBULATORY_CARE_PROVIDER_SITE_OTHER): Payer: Self-pay

## 2024-03-30 NOTE — Telephone Encounter (Signed)
 PA for Ozempic  Available without authorization.

## 2024-04-02 ENCOUNTER — Other Ambulatory Visit (INDEPENDENT_AMBULATORY_CARE_PROVIDER_SITE_OTHER): Payer: Self-pay | Admitting: Family Medicine

## 2024-04-02 DIAGNOSIS — E1165 Type 2 diabetes mellitus with hyperglycemia: Secondary | ICD-10-CM

## 2024-04-06 ENCOUNTER — Telehealth (INDEPENDENT_AMBULATORY_CARE_PROVIDER_SITE_OTHER): Admitting: Family Medicine

## 2024-04-06 DIAGNOSIS — Z7985 Long-term (current) use of injectable non-insulin antidiabetic drugs: Secondary | ICD-10-CM

## 2024-04-06 DIAGNOSIS — E119 Type 2 diabetes mellitus without complications: Secondary | ICD-10-CM

## 2024-04-06 DIAGNOSIS — E66813 Obesity, class 3: Secondary | ICD-10-CM

## 2024-04-06 DIAGNOSIS — E669 Obesity, unspecified: Secondary | ICD-10-CM

## 2024-04-06 DIAGNOSIS — Z6841 Body Mass Index (BMI) 40.0 and over, adult: Secondary | ICD-10-CM | POA: Diagnosis not present

## 2024-04-06 DIAGNOSIS — E1165 Type 2 diabetes mellitus with hyperglycemia: Secondary | ICD-10-CM

## 2024-04-06 MED ORDER — OZEMPIC (0.25 OR 0.5 MG/DOSE) 2 MG/3ML ~~LOC~~ SOPN: 0.5000 mg | PEN_INJECTOR | SUBCUTANEOUS | 0 refills | Status: DC

## 2024-04-06 NOTE — Progress Notes (Signed)
 TeleHealth Visit:   Tracy Graves has verbally consented to this TeleHealth visit. The patient is located at home, the provider is located at the [facility] office. The participants in this visit include the listed provider and patient.The visit was conducted today via MyChart.    Chief Complaint: OBESITY Tracy Graves is here to discuss her progress with her obesity treatment plan along with follow-up of her obesity related diagnoses. [name] is on keeping a food journal and adhering to recommended goals of 1100-1300 calories and 90 grams of protein and states she is following her eating plan approximately 100% of the time. [name] states she is walking 35 minutes with a weight vest, exercises for 60 minutes 7 times per week.  No data recorded No data recorded No data recorded No data recorded  Reported Weight:none  Subjective:  Patient has bee following pretty much the same calories and foods she was doing previously over the last month.  She is eating more salad due to the warmer weather.  She has been drinking more broth recently due to URI symptoms.  She is still journaling and writing up her food intake daily.  She is falling aroun 1280-1360 calories daily and total protein around 90 grams daily. She isn't feeling nauseous back on the ozempic but also is not experiencing any hunger or cravings.   There are no diagnoses linked to this encounter. Assessment/Plan:   Problem List Items Addressed This Visit       Endocrine   Diabetes mellitus (HCC)   Doing well on Ozempic.  Significant satiety as well as hunger control.  Will continue on current dose of Ozempic with refill sent in today.      Relevant Medications   Semaglutide,0.25 or 0.5MG /DOS, (OZEMPIC, 0.25 OR 0.5 MG/DOSE,) 2 MG/3ML SOPN     Other   Hypercalcemia - Primary   Patient's calcium has been elevated for quite some time.  She is not on any calcium altering medications as we have previously stopped those.  Due to  elevation will refer to endocrinology for further evaluation and assessment.  Follow-up once patient sees endocrine.      Relevant Orders   Ambulatory referral to Endocrinology    There are no diagnoses linked to this encounter.  Tracy Graves is currently in the action stage of change. As such, her goal is to continue with weight loss efforts and has agreed to keeping a food journal and adhering to recommended goals of 1100-1300 calories and 85 or more grams of protein.   Exercise goals: For substantial health benefits, adults should do at least 150 minutes (2 hours and 30 minutes) a week of moderate-intensity, or 75 minutes (1 hour and 15 minutes) a week of vigorous-intensity aerobic physical activity, or an equivalent combination of moderate- and vigorous-intensity aerobic activity. Aerobic activity should be performed in episodes of at least 10 minutes, and preferably, it should be spread throughout the week.  Behavioral modification strategies: increasing lean protein intake, decreasing simple carbohydrates, no skipping meals, and meal planning and cooking strategies.  [name] has agreed to follow-up with our clinic in 4 weeks. She was informed of the importance of frequent follow-up visits to maximize her success with intensive lifestyle modifications for her multiple health conditions.   Objective:   VITALS: Per patient if applicable, see vitals. GENERAL: Alert and in no acute distress. CARDIOPULMONARY: No increased WOB. Speaking in clear sentences.  PSYCH: Pleasant and cooperative. Speech normal rate and rhythm. Affect is appropriate. Insight and  judgement are appropriate. Attention is focused, linear, and appropriate.  NEURO: Oriented as arrived to appointment on time with no prompting.   Lab Results  Component Value Date   CREATININE 0.94 03/09/2024   BUN 15 03/09/2024   NA 138 03/09/2024   K 5.2 03/09/2024   CL 102 03/09/2024   CO2 23 03/09/2024   Lab Results  Component Value  Date   ALT 15 03/09/2024   AST 16 03/09/2024   ALKPHOS 147 (H) 03/09/2024   BILITOT 0.4 03/09/2024   Lab Results  Component Value Date   HGBA1C 5.6 03/09/2024   HGBA1C 5.7 (H) 01/21/2023   HGBA1C 5.7 (H) 09/11/2022   HGBA1C 5.5 03/22/2022   HGBA1C 5.6 07/31/2021   Lab Results  Component Value Date   INSULIN 16.8 03/09/2024   INSULIN 20.0 01/21/2023   INSULIN 29.2 (H) 03/22/2022   INSULIN 22.8 07/31/2021   INSULIN 16.8 02/14/2021   Lab Results  Component Value Date   TSH 0.520 03/09/2024   Lab Results  Component Value Date   CHOL 116 03/09/2024   HDL 56 03/09/2024   LDLCALC 41 03/09/2024   TRIG 105 03/09/2024   CHOLHDL 2.2 01/04/2023   Lab Results  Component Value Date   VD25OH 54.2 03/09/2024   VD25OH 35.8 01/21/2023   VD25OH 37.2 09/11/2022   Lab Results  Component Value Date   WBC 10.0 03/22/2022   HGB 16.7 (H) 03/22/2022   HCT 49.2 (H) 03/22/2022   MCV 93 03/22/2022   PLT 463 (H) 03/22/2022   No results found for: "IRON", "TIBC", "FERRITIN"  Attestation Statements:   Reviewed by clinician on day of visit: allergies, medications, problem list, medical history, surgical history, family history, social history, and previous encounter notes.     Donaciano Frizzle, MD

## 2024-04-15 NOTE — Assessment & Plan Note (Signed)
 Doing well on Ozempic.  Significant satiety as well as hunger control.  Will continue on current dose of Ozempic with refill sent in today.

## 2024-04-15 NOTE — Assessment & Plan Note (Signed)
 Patient's calcium has been elevated for quite some time.  She is not on any calcium altering medications as we have previously stopped those.  Due to elevation will refer to endocrinology for further evaluation and assessment.  Follow-up once patient sees endocrine.

## 2024-04-17 ENCOUNTER — Telehealth: Admitting: Physician Assistant

## 2024-04-17 DIAGNOSIS — J019 Acute sinusitis, unspecified: Secondary | ICD-10-CM | POA: Diagnosis not present

## 2024-04-17 DIAGNOSIS — B9689 Other specified bacterial agents as the cause of diseases classified elsewhere: Secondary | ICD-10-CM | POA: Diagnosis not present

## 2024-04-17 MED ORDER — AMOXICILLIN-POT CLAVULANATE 875-125 MG PO TABS: 1.0000 | ORAL_TABLET | Freq: Two times a day (BID) | ORAL | 0 refills | Status: DC

## 2024-04-17 NOTE — Progress Notes (Signed)
 E-Visit for Sinus Problems  We are sorry that you are not feeling well.  Here is how we plan to help!  Based on what you have shared with me it looks like you have sinusitis.  Sinusitis is inflammation and infection in the sinus cavities of the head.

## 2024-04-24 DIAGNOSIS — E119 Type 2 diabetes mellitus without complications: Secondary | ICD-10-CM | POA: Diagnosis not present

## 2024-04-24 DIAGNOSIS — Z119 Encounter for screening for infectious and parasitic diseases, unspecified: Secondary | ICD-10-CM | POA: Diagnosis not present

## 2024-04-24 DIAGNOSIS — R809 Proteinuria, unspecified: Secondary | ICD-10-CM | POA: Diagnosis not present

## 2024-04-24 DIAGNOSIS — I10 Essential (primary) hypertension: Secondary | ICD-10-CM | POA: Diagnosis not present

## 2024-04-24 DIAGNOSIS — E785 Hyperlipidemia, unspecified: Secondary | ICD-10-CM | POA: Diagnosis not present

## 2024-04-24 DIAGNOSIS — E039 Hypothyroidism, unspecified: Secondary | ICD-10-CM | POA: Diagnosis not present

## 2024-04-24 DIAGNOSIS — Z720 Tobacco use: Secondary | ICD-10-CM | POA: Diagnosis not present

## 2024-05-05 ENCOUNTER — Other Ambulatory Visit (INDEPENDENT_AMBULATORY_CARE_PROVIDER_SITE_OTHER): Payer: Self-pay | Admitting: Family Medicine

## 2024-05-05 DIAGNOSIS — E1165 Type 2 diabetes mellitus with hyperglycemia: Secondary | ICD-10-CM

## 2024-05-11 ENCOUNTER — Encounter (INDEPENDENT_AMBULATORY_CARE_PROVIDER_SITE_OTHER): Payer: Self-pay

## 2024-05-11 ENCOUNTER — Ambulatory Visit (INDEPENDENT_AMBULATORY_CARE_PROVIDER_SITE_OTHER): Admitting: Family Medicine

## 2024-05-27 ENCOUNTER — Emergency Department (HOSPITAL_COMMUNITY)
Admission: EM | Admit: 2024-05-27 | Discharge: 2024-05-27 | Disposition: A | Discharge status: Home / Self Care | Attending: Emergency Medicine | Admitting: Emergency Medicine

## 2024-05-27 ENCOUNTER — Other Ambulatory Visit: Payer: Self-pay

## 2024-05-27 ENCOUNTER — Encounter (INDEPENDENT_AMBULATORY_CARE_PROVIDER_SITE_OTHER): Payer: Self-pay | Admitting: Family Medicine

## 2024-05-27 ENCOUNTER — Encounter (HOSPITAL_COMMUNITY): Payer: Self-pay

## 2024-05-27 ENCOUNTER — Emergency Department (HOSPITAL_COMMUNITY)

## 2024-05-27 DIAGNOSIS — M79604 Pain in right leg: Secondary | ICD-10-CM | POA: Insufficient documentation

## 2024-05-27 DIAGNOSIS — K449 Diaphragmatic hernia without obstruction or gangrene: Secondary | ICD-10-CM | POA: Diagnosis not present

## 2024-05-27 DIAGNOSIS — N200 Calculus of kidney: Secondary | ICD-10-CM

## 2024-05-27 DIAGNOSIS — R109 Unspecified abdominal pain: Secondary | ICD-10-CM | POA: Diagnosis present

## 2024-05-27 DIAGNOSIS — R112 Nausea with vomiting, unspecified: Secondary | ICD-10-CM | POA: Diagnosis not present

## 2024-05-27 DIAGNOSIS — N132 Hydronephrosis with renal and ureteral calculous obstruction: Secondary | ICD-10-CM | POA: Insufficient documentation

## 2024-05-27 DIAGNOSIS — D72829 Elevated white blood cell count, unspecified: Secondary | ICD-10-CM | POA: Diagnosis not present

## 2024-05-27 DIAGNOSIS — K5730 Diverticulosis of large intestine without perforation or abscess without bleeding: Secondary | ICD-10-CM | POA: Diagnosis not present

## 2024-05-27 DIAGNOSIS — K8020 Calculus of gallbladder without cholecystitis without obstruction: Secondary | ICD-10-CM | POA: Diagnosis not present

## 2024-05-27 LAB — BASIC METABOLIC PANEL WITH GFR
Anion gap: 11 (ref 5–15)
BUN: 19 mg/dL (ref 8–23)
CO2: 21 mmol/L — ABNORMAL LOW (ref 22–32)
Calcium: 10.7 mg/dL — ABNORMAL HIGH (ref 8.9–10.3)
Chloride: 102 mmol/L (ref 98–111)
Creatinine, Ser: 1.18 mg/dL — ABNORMAL HIGH (ref 0.44–1.00)
GFR, Estimated: 52 mL/min — ABNORMAL LOW (ref >60)
Glucose, Bld: 123 mg/dL — ABNORMAL HIGH (ref 70–99)
Potassium: 4.3 mmol/L (ref 3.5–5.1)
Sodium: 134 mmol/L — ABNORMAL LOW (ref 135–145)

## 2024-05-27 LAB — URINALYSIS, ROUTINE W REFLEX MICROSCOPIC
Bacteria, UA: NONE SEEN
Bilirubin Urine: NEGATIVE
Glucose, UA: NEGATIVE mg/dL
Ketones, ur: NEGATIVE mg/dL
Nitrite: NEGATIVE
Protein, ur: 100 mg/dL — AB
RBC / HPF: >50 RBC/hpf (ref 0–5)
Specific Gravity, Urine: 1.025 (ref 1.005–1.030)
pH: 5 (ref 5.0–8.0)

## 2024-05-27 LAB — CBC WITH DIFFERENTIAL/PLATELET
Abs Immature Granulocytes: 0.09 10*3/uL — ABNORMAL HIGH (ref 0.00–0.07)
Basophils Absolute: 0.1 10*3/uL (ref 0.0–0.1)
Basophils Relative: 0 %
Eosinophils Absolute: 0.1 10*3/uL (ref 0.0–0.5)
Eosinophils Relative: 1 %
HCT: 50.5 % — ABNORMAL HIGH (ref 36.0–46.0)
Hemoglobin: 16.7 g/dL — ABNORMAL HIGH (ref 12.0–15.0)
Immature Granulocytes: 1 %
Lymphocytes Relative: 9 %
Lymphs Abs: 1.3 10*3/uL (ref 0.7–4.0)
MCH: 31.4 pg (ref 26.0–34.0)
MCHC: 33.1 g/dL (ref 30.0–36.0)
MCV: 94.9 fL (ref 80.0–100.0)
Monocytes Absolute: 0.8 10*3/uL (ref 0.1–1.0)
Monocytes Relative: 5 %
Neutro Abs: 12.1 10*3/uL — ABNORMAL HIGH (ref 1.7–7.7)
Neutrophils Relative %: 84 %
Platelets: 480 10*3/uL — ABNORMAL HIGH (ref 150–400)
RBC: 5.32 MIL/uL — ABNORMAL HIGH (ref 3.87–5.11)
RDW: 13 % (ref 11.5–15.5)
WBC: 14.4 10*3/uL — ABNORMAL HIGH (ref 4.0–10.5)
nRBC: 0 % (ref 0.0–0.2)

## 2024-05-27 MED ORDER — OXYCODONE-ACETAMINOPHEN 5-325 MG PO TABS
1.0000 | ORAL_TABLET | Freq: Once | ORAL | Status: AC
  Administered 2024-05-27: 1 via ORAL
  Filled 2024-05-27: qty 1

## 2024-05-27 MED ORDER — HYDROMORPHONE HCL 1 MG/ML IJ SOLN
0.5000 mg | Freq: Once | INTRAMUSCULAR | Status: AC
  Administered 2024-05-27: 0.5 mg via INTRAVENOUS
  Filled 2024-05-27: qty 1

## 2024-05-27 MED ORDER — ONDANSETRON HCL 4 MG/2ML IJ SOLN
4.0000 mg | Freq: Once | INTRAMUSCULAR | Status: AC
Start: 2024-05-27 — End: 2024-05-27
  Administered 2024-05-27: 4 mg via INTRAVENOUS
  Filled 2024-05-27: qty 2

## 2024-05-27 MED ORDER — OXYCODONE-ACETAMINOPHEN 5-325 MG PO TABS: 1.0000 | ORAL_TABLET | Freq: Three times a day (TID) | ORAL | 0 refills | Status: DC | PRN

## 2024-05-27 MED ORDER — ONDANSETRON 4 MG PO TBDP: 4.0000 mg | ORAL_TABLET | Freq: Three times a day (TID) | ORAL | 0 refills | Status: DC | PRN

## 2024-05-27 NOTE — ED Provider Notes (Signed)
 [place] EMERGENCY DEPARTMENT AT [facility] Provider Note   CSN: [id number] Arrival date & time: 05/27/24  1046     History  Chief Complaint  Patient presents with   Flank Pain    Tracy Graves is a 63 y.o. female.   so   Flank Pain  Patient presents with right leg pain.  Began around 845 this morning.  Severe.  Had some nausea and vomiting.  Was clammy initially.  History of kidney stones and states this feels the same.  Previous kidney stones were on the left and required percutaneous intervention.     Home Medications Prior to Admission medications   Medication Sig Start Date End Date Taking? Authorizing Provider  levocetirizine (XYZAL) 5 MG tablet Take 5 mg by mouth daily. 04/24/24  Yes [provider]  ondansetron  (ZOFRAN -ODT) 4 MG disintegrating tablet Take 1 tablet (4 mg total) by mouth every 8 (eight) hours as needed. 05/27/24  Yes Mozell Arias, MD  oxyCODONE -acetaminophen  (PERCOCET/ROXICET) 5-325 MG tablet Take 1-2 tablets by mouth every 8 (eight) hours as needed for severe pain (pain score 7-10). 05/27/24  Yes Mozell Arias, MD  amoxicillin -clavulanate (AUGMENTIN ) 875-125 MG tablet Take 1 tablet by mouth 2 (two) times daily. 04/17/24   Angelia Kelp, PA-C  atorvastatin  (LIPITOR) 20 MG tablet Take 1 tablet (20 mg total) by mouth daily. 01/10/24   Gerald Kitty., NP  Insulin  Pen Needle (BD PEN NEEDLE NANO 2ND GEN) 32G X 4 MM MISC 1 Package by Does not apply route 2 (two) times daily. 07/10/23   Jenean Minus, MD  levothyroxine  (SYNTHROID ) 125 MCG tablet Take 125 mcg by mouth daily before breakfast.     [provider]  metFORMIN  (GLUCOPHAGE ) 500 MG tablet Take 500 mg by mouth daily with breakfast.     [provider]  metoprolol succinate (TOPROL-XL) 50 MG 24 hr tablet Take 50 mg by mouth daily. 09/04/20   [provider]  polyethylene glycol (MIRALAX  / GLYCOLAX ) 17 g packet Take 17 g by mouth as  needed.    [provider]  Semaglutide ,0.25 or 0.5MG /DOS, (OZEMPIC , 0.25 OR 0.5 MG/DOSE,) 2 MG/3ML SOPN Inject 0.5 mg into the skin once a week. 04/06/24   Jenean Minus, MD  Vitamin D , Ergocalciferol , (DRISDOL ) 1.25 MG (50000 UNIT) CAPS capsule Take 1 capsule (50,000 Units total) by mouth every 7 (seven) days. 03/09/24   Jenean Minus, MD      Allergies    Fish allergy and Other    Review of Systems   Review of Systems  Genitourinary:  Positive for flank pain.    Physical Exam Updated Vital Signs BP 139/87   Pulse 75   Temp (!) 97.5 F (36.4 C) (Oral)   Resp 20   SpO2 95%  Physical Exam Vitals and nursing note reviewed.  Constitutional:      Appearance: She is obese.  Cardiovascular:     Rate and Rhythm: Normal rate.  Abdominal:     Tenderness: There is no abdominal tenderness.  Genitourinary:    Comments: No CVA tenderness Skin:    Capillary Refill: Capillary refill takes less than 2 seconds.  Neurological:     Mental Status: She is alert.     ED Results / Procedures / Treatments   Labs (all labs ordered are listed, but only abnormal results are displayed) Labs Reviewed  URINALYSIS, ROUTINE W REFLEX MICROSCOPIC - Abnormal; Notable for the following components:  Result Value   APPearance CLOUDY (*)    Hgb urine dipstick LARGE (*)    Protein, ur 100 (*)    Leukocytes,Ua TRACE (*)    All other components within normal limits  BASIC METABOLIC PANEL WITH GFR - Abnormal; Notable for the following components:   Sodium 134 (*)    CO2 21 (*)    Glucose, Bld 123 (*)    Creatinine, Ser 1.18 (*)    Calcium  10.7 (*)    GFR, Estimated 52 (*)    All other components within normal limits  CBC WITH DIFFERENTIAL/PLATELET - Abnormal; Notable for the following components:   WBC 14.4 (*)    RBC 5.32 (*)    Hemoglobin 16.7 (*)    HCT 50.5 (*)    Platelets 480 (*)    Neutro Abs 12.1 (*)    Abs Immature Granulocytes 0.09 (*)    All other components  within normal limits    EKG None  Radiology CT Renal Stone Study Result Date: 05/27/2024 CLINICAL DATA:  Abdominal/flank pain.  Concern for kidney stone. EXAM: CT ABDOMEN AND PELVIS WITHOUT CONTRAST TECHNIQUE: Multidetector CT imaging of the abdomen and pelvis was performed following the standard protocol without IV contrast. RADIATION DOSE REDUCTION: This exam was performed according to the departmental dose-optimization program which includes automated exposure control, adjustment of the mA and/or kV according to patient size and/or use of iterative reconstruction technique. COMPARISON:  CT abdomen pelvis dated 01/28/2017. FINDINGS: Evaluation of this exam is limited in the absence of intravenous contrast. Lower chest: The visualized lung bases are clear. No intra-abdominal free air or free fluid. Hepatobiliary: The liver is unremarkable. No biliary dilatation. Multiple gallstones. No pericholecystic fluid or evidence of acute cholecystitis by CT. Pancreas: Unremarkable. No pancreatic ductal dilatation or surrounding inflammatory changes. Spleen: Normal in size without focal abnormality. Adrenals/Urinary Tract: The adrenal glands are unremarkable. There is a 1 cm stone in the distal right ureter. There is mild right hydronephrosis. The left kidney, left ureter, and urinary bladder appear unremarkable. Stomach/Bowel: There is a large hiatal hernial with herniation of the stomach. There is sigmoid diverticulosis. There is no bowel obstruction or active inflammation. The appendix is normal. Vascular/Lymphatic: Moderate aortoiliac atherosclerotic disease. The IVC is unremarkable. No portal gas. There is no adenopathy. Reproductive: Multiple uterine fibroids. No suspicious adnexal masses. Other: None Musculoskeletal: No acute osseous pathology. IMPRESSION: 1. A 1 cm distal right ureteral stone with mild right hydronephrosis. 2. Cholelithiasis. 3. Sigmoid diverticulosis. No bowel obstruction. Normal appendix. 4.  Large hiatal hernia. 5.  Aortic Atherosclerosis (ICD10-I70.0). Electronically Signed   By: Angus Bark M.D.   On: 05/27/2024 13:49    Procedures Procedures    Medications Ordered in ED Medications  ondansetron  (ZOFRAN ) injection 4 mg (4 mg Intravenous Given 05/27/24 1113)  HYDROmorphone (DILAUDID) injection 0.5 mg (0.5 mg Intravenous Given 05/27/24 1114)  oxyCODONE -acetaminophen  (PERCOCET/ROXICET) 5-325 MG per tablet 1 tablet (1 tablet Oral Given 05/27/24 1340)    ED Course/ Medical Decision Making/ A&P                                 Medical Decision Making Amount and/or Complexity of Data Reviewed Labs: ordered. Radiology: ordered.  Risk Prescription drug management.   Patient with right sided flank pain.  History of kidney stones.  I think this is most likely the cause.  Will get CT scan since she had just been around  8 years since previous stone.    Reviewed previous CT scan but did not show right-sided stones  Will treat symptomatically.  Urine does not show infection.  White counts slightly elevated as it is her creatinine.  However pain controlled.  CT scan does show large stone on the right.  However with pain controlled think can follow-up as an outpatient.  Will discharge home.        Final Clinical Impression(s) / ED Diagnoses Final diagnoses:  Kidney stone    Rx / DC Orders ED Discharge Orders          Ordered    ondansetron  (ZOFRAN -ODT) 4 MG disintegrating tablet  Every 8 hours PRN        05/27/24 1415    oxyCODONE -acetaminophen  (PERCOCET/ROXICET) 5-325 MG tablet  Every 8 hours PRN        05/27/24 1415              Mozell Arias, MD 05/27/24 1501

## 2024-05-27 NOTE — ED Triage Notes (Signed)
 Pt c/o right side flank pain that began around 0845 today. Pt has hx of kidney stones and states this feels the same. Pt states she has had n/v.

## 2024-05-28 ENCOUNTER — Other Ambulatory Visit (INDEPENDENT_AMBULATORY_CARE_PROVIDER_SITE_OTHER): Payer: Self-pay | Admitting: Family Medicine

## 2024-05-28 DIAGNOSIS — E559 Vitamin D deficiency, unspecified: Secondary | ICD-10-CM

## 2024-05-28 DIAGNOSIS — N201 Calculus of ureter: Secondary | ICD-10-CM | POA: Diagnosis not present

## 2024-05-28 NOTE — Telephone Encounter (Signed)
**Note De-identified  [place] Obfuscation** Please advise 

## 2024-05-29 ENCOUNTER — Emergency Department (HOSPITAL_COMMUNITY)
Admission: EM | Admit: 2024-05-29 | Discharge: 2024-05-29 | Disposition: A | Discharge status: Home / Self Care | Attending: Emergency Medicine | Admitting: Emergency Medicine

## 2024-05-29 ENCOUNTER — Encounter (HOSPITAL_COMMUNITY): Payer: Self-pay | Admitting: Emergency Medicine

## 2024-05-29 DIAGNOSIS — I10 Essential (primary) hypertension: Secondary | ICD-10-CM | POA: Insufficient documentation

## 2024-05-29 DIAGNOSIS — E119 Type 2 diabetes mellitus without complications: Secondary | ICD-10-CM | POA: Insufficient documentation

## 2024-05-29 DIAGNOSIS — Z720 Tobacco use: Secondary | ICD-10-CM | POA: Diagnosis not present

## 2024-05-29 DIAGNOSIS — D72829 Elevated white blood cell count, unspecified: Secondary | ICD-10-CM | POA: Insufficient documentation

## 2024-05-29 DIAGNOSIS — Z79899 Other long term (current) drug therapy: Secondary | ICD-10-CM | POA: Insufficient documentation

## 2024-05-29 DIAGNOSIS — Z7984 Long term (current) use of oral hypoglycemic drugs: Secondary | ICD-10-CM | POA: Insufficient documentation

## 2024-05-29 DIAGNOSIS — N23 Unspecified renal colic: Secondary | ICD-10-CM | POA: Insufficient documentation

## 2024-05-29 DIAGNOSIS — R109 Unspecified abdominal pain: Secondary | ICD-10-CM | POA: Diagnosis present

## 2024-05-29 DIAGNOSIS — E871 Hypo-osmolality and hyponatremia: Secondary | ICD-10-CM | POA: Insufficient documentation

## 2024-05-29 LAB — BASIC METABOLIC PANEL WITH GFR
Anion gap: 12 (ref 5–15)
BUN: 22 mg/dL (ref 8–23)
CO2: 19 mmol/L — ABNORMAL LOW (ref 22–32)
Calcium: 10.7 mg/dL — ABNORMAL HIGH (ref 8.9–10.3)
Chloride: 103 mmol/L (ref 98–111)
Creatinine, Ser: 0.96 mg/dL (ref 0.44–1.00)
GFR, Estimated: >60 mL/min (ref >60)
Glucose, Bld: 137 mg/dL — ABNORMAL HIGH (ref 70–99)
Potassium: 4.2 mmol/L (ref 3.5–5.1)
Sodium: 134 mmol/L — ABNORMAL LOW (ref 135–145)

## 2024-05-29 LAB — CBC WITH DIFFERENTIAL/PLATELET
Abs Immature Granulocytes: 0.1 10*3/uL — ABNORMAL HIGH (ref 0.00–0.07)
Basophils Absolute: 0.1 10*3/uL (ref 0.0–0.1)
Basophils Relative: 1 %
Eosinophils Absolute: 0.1 10*3/uL (ref 0.0–0.5)
Eosinophils Relative: 0 %
HCT: 49.4 % — ABNORMAL HIGH (ref 36.0–46.0)
Hemoglobin: 17 g/dL — ABNORMAL HIGH (ref 12.0–15.0)
Immature Granulocytes: 1 %
Lymphocytes Relative: 9 %
Lymphs Abs: 1.7 10*3/uL (ref 0.7–4.0)
MCH: 31.9 pg (ref 26.0–34.0)
MCHC: 34.4 g/dL (ref 30.0–36.0)
MCV: 92.7 fL (ref 80.0–100.0)
Monocytes Absolute: 1.1 10*3/uL — ABNORMAL HIGH (ref 0.1–1.0)
Monocytes Relative: 6 %
Neutro Abs: 15.5 10*3/uL — ABNORMAL HIGH (ref 1.7–7.7)
Neutrophils Relative %: 83 %
Platelets: 524 10*3/uL — ABNORMAL HIGH (ref 150–400)
RBC: 5.33 MIL/uL — ABNORMAL HIGH (ref 3.87–5.11)
RDW: 13 % (ref 11.5–15.5)
WBC: 18.6 10*3/uL — ABNORMAL HIGH (ref 4.0–10.5)
nRBC: 0 % (ref 0.0–0.2)

## 2024-05-29 LAB — URINALYSIS, W/ REFLEX TO CULTURE (INFECTION SUSPECTED)
Bacteria, UA: NONE SEEN
Bilirubin Urine: NEGATIVE
Glucose, UA: NEGATIVE mg/dL
Ketones, ur: 5 mg/dL — AB
Leukocytes,Ua: NEGATIVE
Nitrite: NEGATIVE
Protein, ur: 100 mg/dL — AB
RBC / HPF: >50 RBC/hpf (ref 0–5)
Specific Gravity, Urine: 1.031 — ABNORMAL HIGH (ref 1.005–1.030)
pH: 5 (ref 5.0–8.0)

## 2024-05-29 MED ORDER — ONDANSETRON 4 MG PO TBDP: 4.0000 mg | ORAL_TABLET | Freq: Three times a day (TID) | ORAL | 0 refills | Status: AC | PRN

## 2024-05-29 MED ORDER — TAMSULOSIN HCL 0.4 MG PO CAPS: 0.4000 mg | ORAL_CAPSULE | Freq: Every day | ORAL | 0 refills | Status: AC

## 2024-05-29 MED ORDER — SODIUM CHLORIDE 0.9 % IV BOLUS
1000.0000 mL | Freq: Once | INTRAVENOUS | Status: AC
  Administered 2024-05-29: 1000 mL via INTRAVENOUS

## 2024-05-29 MED ORDER — ONDANSETRON HCL 4 MG/2ML IJ SOLN
4.0000 mg | Freq: Once | INTRAMUSCULAR | Status: AC
  Administered 2024-05-29: 4 mg via INTRAVENOUS
  Filled 2024-05-29: qty 2

## 2024-05-29 MED ORDER — KETOROLAC TROMETHAMINE 15 MG/ML IJ SOLN
15.0000 mg | Freq: Once | INTRAMUSCULAR | Status: AC
  Administered 2024-05-29: 15 mg via INTRAVENOUS
  Filled 2024-05-29: qty 1

## 2024-05-29 MED ORDER — IBUPROFEN 200 MG PO TABS
600.0000 mg | ORAL_TABLET | Freq: Once | ORAL | Status: AC
  Administered 2024-05-29: 600 mg via ORAL
  Filled 2024-05-29: qty 3

## 2024-05-29 MED ORDER — KETOROLAC TROMETHAMINE 10 MG PO TABS: 10.0000 mg | ORAL_TABLET | Freq: Four times a day (QID) | ORAL | 0 refills | Status: DC | PRN

## 2024-05-29 MED ORDER — HYDROMORPHONE HCL 1 MG/ML IJ SOLN
1.0000 mg | Freq: Once | INTRAMUSCULAR | Status: AC
  Administered 2024-05-29: 1 mg via INTRAVENOUS
  Filled 2024-05-29: qty 1

## 2024-05-29 NOTE — ED Notes (Signed)
 Gave pt ice chips RN made aware

## 2024-05-29 NOTE — ED Triage Notes (Signed)
 PT had kidney stones starting yesterday. Hx of such. Saw urologist today. Nausea and vomiting. Reports she took 2 oxy earlier and no relief. Was told to come here by urologist if pain too bad.

## 2024-05-29 NOTE — ED Provider Notes (Signed)
 [place] EMERGENCY DEPARTMENT AT [facility] Provider Note  CSN: [id number] Arrival date & time: 05/29/24 0004  Chief Complaint(s) Flank Pain  HPI Tracy Graves is a 63 y.o. female with a past medical history listed below including known 1 cm right distal ureter stone seen by Dr. Antonetta Kitchen from urology yesterday who plan for likely surgical intervention returns to the emergency department for uncontrolled pain despite taking home meds.  She endorses nausea without overt emesis.  Urine is now darker than prior.  Has been taken oxycodone  without relief.   Flank Pain    Past Medical History Past Medical History:  Diagnosis Date   Abdominal hernia    Abscess    chest wall, right breast   Cellulitis    Diabetes (HCC)    History of kidney stones    Hyperglycemia due to type 2 diabetes mellitus (HCC) 06/05/2015   Hypertension    Hypothyroid    2016   Intertrigo    Morbid obesity due to excess calories (HCC) 06/05/2015   Multiple food allergies    Shortness of breath    Tobacco use    Patient Active Problem List   Diagnosis Date Noted   Hypercalcemia 04/15/2024   Type 2 diabetes mellitus with hyperglycemia (HCC) 12/18/2023   Vitamin D  deficiency 12/18/2023   OSA (obstructive sleep apnea) 12/27/2020   Hyperlipidemia associated with type 2 diabetes mellitus (HCC) 12/27/2020   Aortic atherosclerosis (HCC) 09/21/2020   Renal stone 01/28/2017   Hydronephrosis 12/23/2016   UTI (urinary tract infection) 12/23/2016   Hypertension associated with type 2 diabetes mellitus (HCC) 12/23/2016   Hyponatremia 12/23/2016   Hypothyroidism, acquired 06/07/2015   Morbid obesity (HCC) 06/05/2015   Tobacco abuse 06/05/2015   Diabetes mellitus (HCC) 06/05/2015   Home Medication(s) Prior to Admission medications   Medication Sig Start Date End Date Taking? Authorizing Provider  cephALEXin  (KEFLEX ) 500 MG capsule Take 500 mg by mouth 2 (two) times daily. 05/28/24  Yes [provider]  ketorolac  (TORADOL ) 10 MG tablet Take 1 tablet (10 mg total) by mouth every 6 (six) hours as needed. 05/29/24  Yes Pakou Rainbow, Camila Cecil, MD  nystatin  (MYCOSTATIN /NYSTOP ) powder Apply 1 Application topically 2 (two) times daily. 02/01/24  Yes [provider]  ondansetron  (ZOFRAN -ODT) 4 MG disintegrating tablet Take 1 tablet (4 mg total) by mouth every 8 (eight) hours as needed for up to 3 days for nausea or vomiting. 05/29/24 06/01/24 Yes Katiya Fike, Camila Cecil, MD  tamsulosin  (FLOMAX ) 0.4 MG CAPS capsule Take 1 capsule (0.4 mg total) by mouth daily for 5 days. 05/29/24 06/03/24 Yes Ona Roehrs, Camila Cecil, MD  amoxicillin -clavulanate (AUGMENTIN ) 875-125 MG tablet Take 1 tablet by mouth 2 (two) times daily. 04/17/24   Angelia Kelp, PA-C  atorvastatin  (LIPITOR) 20 MG tablet Take 1 tablet (20 mg total) by mouth daily. 01/10/24   Gerald Kitty., NP  Insulin  Pen Needle (BD PEN NEEDLE NANO 2ND GEN) 32G X 4 MM MISC 1 Package by Does not apply route 2 (two) times daily. 07/10/23   Jenean Minus, MD  levocetirizine (XYZAL) 5 MG tablet Take 5 mg by mouth daily. 04/24/24   [provider]  levothyroxine  (SYNTHROID ) 125 MCG tablet Take 125 mcg by mouth daily before breakfast.     [provider]  metFORMIN  (GLUCOPHAGE ) 500 MG tablet Take 500 mg by mouth daily with breakfast.     [provider]  metoprolol succinate (TOPROL-XL) 50 MG 24 hr tablet Take  50 mg by mouth daily. 09/04/20   [provider]  ondansetron  (ZOFRAN -ODT) 4 MG disintegrating tablet Take 1 tablet (4 mg total) by mouth every 8 (eight) hours as needed. 05/27/24   Mozell Arias, MD  oxyCODONE -acetaminophen  (PERCOCET/ROXICET) 5-325 MG tablet Take 1-2 tablets by mouth every 8 (eight) hours as needed for severe pain (pain score 7-10). 05/27/24   Mozell Arias, MD  polyethylene glycol (MIRALAX  / GLYCOLAX ) 17 g packet Take 17 g by mouth as needed.    [provider]   Semaglutide ,0.25 or 0.5MG /DOS, (OZEMPIC , 0.25 OR 0.5 MG/DOSE,) 2 MG/3ML SOPN Inject 0.5 mg into the skin once a week. 04/06/24   Jenean Minus, MD  Vitamin D , Ergocalciferol , (DRISDOL ) 1.25 MG (50000 UNIT) CAPS capsule Take 1 capsule (50,000 Units total) by mouth every 7 (seven) days. 03/09/24   Jenean Minus, MD                                                                                                                                    Allergies Fish allergy and Other  Review of Systems Review of Systems  Genitourinary:  Positive for flank pain.   As noted in HPI  Physical Exam Vital Signs  I have reviewed the triage vital signs BP 132/72   Pulse 79   Temp 97.8 F (36.6 C) (Oral)   Resp 20   SpO2 90%   Physical Exam Vitals reviewed.  Constitutional:      General: She is not in acute distress.    Appearance: She is well-developed. She is obese. She is not diaphoretic.  HENT:     Head: Normocephalic and atraumatic.     Right Ear: External ear normal.     Left Ear: External ear normal.     Nose: Nose normal.  Eyes:     General: No scleral icterus.    Conjunctiva/sclera: Conjunctivae normal.  Neck:     Trachea: Phonation normal.  Cardiovascular:     Rate and Rhythm: Normal rate and regular rhythm.  Pulmonary:     Effort: Pulmonary effort is normal. No respiratory distress.     Breath sounds: No stridor.  Abdominal:     General: There is no distension.  Musculoskeletal:        General: Normal range of motion.     Cervical back: Normal range of motion.  Neurological:     Mental Status: She is alert and oriented to person, place, and time.  Psychiatric:        Behavior: Behavior normal.     ED Results and Treatments Labs (all labs ordered are listed, but only abnormal results are displayed) Labs Reviewed  CBC WITH DIFFERENTIAL/PLATELET - Abnormal; Notable for the following components:      Result Value   WBC 18.6 (*)    RBC 5.33 (*)     Hemoglobin 17.0 (*)    HCT 49.4 (*)  Platelets 524 (*)    Neutro Abs 15.5 (*)    Monocytes Absolute 1.1 (*)    Abs Immature Granulocytes 0.10 (*)    All other components within normal limits  BASIC METABOLIC PANEL WITH GFR - Abnormal; Notable for the following components:   Sodium 134 (*)    CO2 19 (*)    Glucose, Bld 137 (*)    Calcium  10.7 (*)    All other components within normal limits  URINALYSIS, W/ REFLEX TO CULTURE (INFECTION SUSPECTED) - Abnormal; Notable for the following components:   Color, Urine BROWN (*)    APPearance CLOUDY (*)    Specific Gravity, Urine 1.031 (*)    Hgb urine dipstick LARGE (*)    Ketones, ur 5 (*)    Protein, ur 100 (*)    All other components within normal limits                                                                                                                         EKG  EKG Interpretation Date/Time:    Ventricular Rate:    PR Interval:    QRS Duration:    QT Interval:    QTC Calculation:   R Axis:      Text Interpretation:         Radiology No results found.  Medications Ordered in ED Medications  ketorolac (TORADOL) 15 MG/ML injection 15 mg (15 mg Intravenous Given 05/29/24 0116)  HYDROmorphone (DILAUDID) injection 1 mg (1 mg Intravenous Given 05/29/24 0117)  ondansetron  (ZOFRAN ) injection 4 mg (4 mg Intravenous Given 05/29/24 0116)  sodium chloride  0.9 % bolus 1,000 mL (1,000 mLs Intravenous Bolus 05/29/24 0118)  ibuprofen (ADVIL) tablet 600 mg (600 mg Oral Given 05/29/24 0325)  ondansetron  (ZOFRAN ) injection 4 mg (4 mg Intravenous Given 05/29/24 0350)   Procedures Procedures  (including critical care time) Medical Decision Making / ED Course   Medical Decision Making Amount and/or Complexity of Data Reviewed Labs: ordered. Decision-making details documented in ED Course.  Risk OTC drugs. Prescription drug management.    Patient with tenderness for pain related to known 1 cm right distal ureter stone.  UA  without evidence of infection.  Treated with IV fluids, antiemetics and pain medicine.  Able to control pain with meds above.  Tolerating p.o. monitor for several hours and remained stable with controlled pain.  Feel she is stable for outpatient management.    Final Clinical Impression(s) / ED Diagnoses Final diagnoses:  Ureteral colic   The patient appears reasonably screened and/or stabilized for discharge and I doubt any other medical condition or other [facility] requiring further screening, evaluation, or treatment in the ED at this time. I have discussed the findings, Dx and Tx plan with the patient/family who expressed understanding and agree(s) with the plan. Discharge instructions discussed at length. The patient/family was given strict return precautions who verbalized understanding of the instructions. No further questions at time of discharge.  Disposition: Discharge  Condition: Good  ED Discharge Orders          Ordered    tamsulosin  (FLOMAX ) 0.4 MG CAPS capsule  Daily        05/29/24 0424    ketorolac  (TORADOL ) 10 MG tablet  Every 6 hours PRN        05/29/24 0424    ondansetron  (ZOFRAN -ODT) 4 MG disintegrating tablet  Every 8 hours PRN        05/29/24 0424             Follow Up: Adelbert Homans, MD [address] [place] [address] [place] [address] [phone]  Call  as needed    This chart was dictated using voice recognition software.  Despite best efforts to proofread,  errors can occur which can change the documentation meaning.    Lindle Rhea, MD 05/29/24 [phone]

## 2024-06-02 ENCOUNTER — Other Ambulatory Visit: Payer: Self-pay | Admitting: Urology

## 2024-06-04 NOTE — Patient Instructions (Signed)
 SURGICAL WAITING ROOM VISITATION  Patients having surgery or a procedure may have no more than 2 support people in the waiting area - these visitors may rotate.    Children under the age of 51 must have an adult with them who is not the patient.  Visitors with respiratory illnesses are discouraged from visiting and should remain at home.  If the patient needs to stay at the hospital during part of their recovery, the visitor guidelines for inpatient rooms apply. Pre-op nurse will coordinate an appropriate time for 1 support person to accompany patient in pre-op.  This support person may not rotate.    Please refer to the [facility] website for the visitor guidelines for Inpatients (after your surgery is over and you are in a regular room).    Your procedure is scheduled on: 06/09/24   Report to [facility] Main Entrance    Report to admitting at 6:15 AM   Call this number if you have problems the morning of surgery [phone]   Do not eat food or drink liquids :After Midnight.          If you have questions, please contact your surgeon's office.   FOLLOW BOWEL PREP AND ANY ADDITIONAL PRE OP INSTRUCTIONS YOU RECEIVED FROM YOUR SURGEON'S OFFICE!!!     Oral Hygiene is also important to reduce your risk of infection.                                    Remember - BRUSH YOUR TEETH THE MORNING OF SURGERY WITH YOUR REGULAR TOOTHPASTE  DENTURES WILL BE REMOVED PRIOR TO SURGERY PLEASE DO NOT APPLY "Poly grip" OR ADHESIVES!!!   Do NOT smoke after Midnight   Stop all vitamins and herbal supplements 7 days before surgery.   Take these medicines the morning of surgery with A SIP OF WATER: Atorvastatin , Xyzal, Levothyroxine , Metoprolol   DO NOT TAKE ANY ORAL DIABETIC MEDICATIONS DAY OF YOUR SURGERY  How to Manage Your Diabetes Before and After Surgery  Why is it important to control my blood sugar before and after surgery? Improving blood sugar levels before and after surgery  helps healing and can limit problems. A way of improving blood sugar control is eating a healthy diet by:  Eating less sugar and carbohydrates  Increasing activity/exercise  Talking with your doctor about reaching your blood sugar goals High blood sugars (greater than 180 mg/dL) can raise your risk of infections and slow your recovery, so you will need to focus on controlling your diabetes during the weeks before surgery. Make sure that the doctor who takes care of your diabetes knows about your planned surgery including the date and location.  How do I manage my blood sugar before surgery? Check your blood sugar at least 4 times a day, starting 2 days before surgery, to make sure that the level is not too high or low. Check your blood sugar the morning of your surgery when you wake up and every 2 hours until you get to the Short Stay unit. If your blood sugar is less than 70 mg/dL, you will need to treat for low blood sugar: Do not take insulin . Treat a low blood sugar (less than 70 mg/dL) with  cup of clear juice (cranberry or apple), 4 glucose tablets, OR glucose gel. Recheck blood sugar in 15 minutes after treatment (to make sure it is greater than 70 mg/dL).  If your blood sugar is not greater than 70 mg/dL on recheck, call [phone] for further instructions. Report your blood sugar to the short stay nurse when you get to Short Stay.  If you are admitted to the hospital after surgery: Your blood sugar will be checked by the staff and you will probably be given insulin  after surgery (instead of oral diabetes medicines) to make sure you have good blood sugar levels. The goal for blood sugar control after surgery is 80-180 mg/dL.   WHAT DO I DO ABOUT MY DIABETES MEDICATION?  Do not take oral diabetes medicines (pills) the morning of surgery.  Do not take Ozempic  after 06/01/24.  THE DAY BEFORE SURGERY, take Metformin  as prescribed.      THE MORNING OF SURGERY, do not take  Metformin .  DO NOT TAKE THE FOLLOWING 7 DAYS PRIOR TO SURGERY: Ozempic , Wegovy, Rybelsus (Semaglutide ), Byetta (exenatide), Bydureon (exenatide ER), Victoza, Saxenda (liraglutide), or Trulicity (dulaglutide) Mounjaro (Tirzepatide) Adlyxin (Lixisenatide), Polyethylene Glycol Loxenatide.  Reviewed and Endorsed by [facility] Patient Education Committee, August 2015                              You may not have any metal on your body including hair pins, jewelry, and body piercing             Do not wear make-up, lotions, powders, perfumes, or deodorant  Do not wear nail polish including gel and S&S, artificial/acrylic nails, or any other type of covering on natural nails including finger and toenails. If you have artificial nails, gel coating, etc. that needs to be removed by a nail salon please have this removed prior to surgery or surgery may need to be canceled/ delayed if the surgeon/ anesthesia feels like they are unable to be safely monitored.   Do not shave  48 hours prior to surgery.    Do not bring valuables to the hospital. [facility] IS NOT             RESPONSIBLE   FOR VALUABLES.   Contacts, glasses, dentures or bridgework may not be worn into surgery.  DO NOT BRING YOUR HOME MEDICATIONS TO THE HOSPITAL. PHARMACY WILL DISPENSE MEDICATIONS LISTED ON YOUR MEDICATION LIST TO YOU DURING YOUR ADMISSION IN THE HOSPITAL!    Patients discharged on the day of surgery will not be allowed to drive home.  Someone NEEDS to stay with you for the first 24 hours after anesthesia.   Special Instructions: Bring a copy of your healthcare power of attorney and living will documents the day of surgery if you haven't scanned them before.              Please read over the following fact sheets you were given: IF YOU HAVE QUESTIONS ABOUT YOUR PRE-OP INSTRUCTIONS PLEASE CALL [phone] Kayleen Party    If you received a COVID test during your pre-op visit  it is requested that you wear a mask when out in  public, stay away from anyone that may not be feeling well and notify your surgeon if you develop symptoms. If you test positive for Covid or have been in contact with anyone that has tested positive in the last 10 days please notify you surgeon.    [facility] - Preparing for Surgery Before surgery, you can play an important role.  Because skin is not sterile, your skin needs to be as free of germs as possible.  You  can reduce the number of germs on your skin by washing with CHG (chlorahexidine gluconate) soap before surgery.  CHG is an antiseptic cleaner which kills germs and bonds with the skin to continue killing germs even after washing. Please DO NOT use if you have an allergy to CHG or antibacterial soaps.  If your skin becomes reddened/irritated stop using the CHG and inform your nurse when you arrive at Short Stay. Do not shave (including legs and underarms) for at least 48 hours prior to the first CHG shower.  You may shave your face/neck.  Please follow these instructions carefully:  1.  Shower with CHG Soap the night before surgery and the  morning of surgery.  2.  If you choose to wash your hair, wash your hair first as usual with your normal  shampoo.  3.  After you shampoo, rinse your hair and body thoroughly to remove the shampoo.                             4.  Use CHG as you would any other liquid soap.  You can apply chg directly to the skin and wash.  Gently with a scrungie or clean washcloth.  5.  Apply the CHG Soap to your body ONLY FROM THE NECK DOWN.   Do   not use on face/ open                           Wound or open sores. Avoid contact with eyes, ears mouth and   genitals (private parts).                       Wash face,  Genitals (private parts) with your normal soap.             6.  Wash thoroughly, paying special attention to the area where your    surgery  will be performed.  7.  Thoroughly rinse your body with warm water from the neck down.  8.  DO NOT shower/wash  with your normal soap after using and rinsing off the CHG Soap.                9.  Pat yourself dry with a clean towel.            10.  Wear clean pajamas.            11.  Place clean sheets on your bed the night of your first shower and do not  sleep with pets. Day of Surgery : Do not apply any lotions/deodorants the morning of surgery.  Please wear clean clothes to the hospital/surgery center.  FAILURE TO FOLLOW THESE INSTRUCTIONS MAY RESULT IN THE CANCELLATION OF YOUR SURGERY  PATIENT SIGNATURE_________________________________  NURSE SIGNATURE__________________________________  ________________________________________________________________________

## 2024-06-04 NOTE — Progress Notes (Signed)
 COVID Vaccine Completed: yes  Date of COVID positive in last 90 days:  PCP - [facility] Cardiologist - Gloriann Larger, MD  Chest x-ray - n/a EKG - 01/10/24 Epic Stress Test - 09/27/20 Epic ECHO - 10/04/20 Epic Cardiac Cath - n/a Pacemaker/ICD device last checked: n/a Spinal Cord Stimulator: n/a  Bowel Prep - no  Sleep Study - no per pt CPAP -   Fasting Blood Sugar - 95-100 Checks Blood Sugar 2-3 times a day  Last dose of GLP1 agonist-  Ozempic , takes Fridays. GLP1 instructions:  Hold 7 days before surgery. Has not taken for past 2 weeks.   Last dose of SGLT-2 inhibitors-  N/A SGLT-2 instructions:  Hold 3 days before surgery    Blood Thinner Instructions:  Last dose: n/a Time: Aspirin Instructions: Last Dose:  Activity level: Can go up a flight of stairs and perform activities of daily living without stopping and without symptoms of chest pain or shortness of breath.  Anesthesia review: HTN, aortic atherosclerosis, DM2, OSA, incomplete RBBB  Patient denies shortness of breath, fever, cough and chest pain at PAT appointment  Patient verbalized understanding of instructions that were given to them at the PAT appointment. Patient was also instructed that they will need to review over the PAT instructions again at home before surgery.

## 2024-06-05 ENCOUNTER — Encounter (HOSPITAL_COMMUNITY)
Admission: RE | Admit: 2024-06-05 | Discharge: 2024-06-05 | Disposition: A | Discharge status: Home / Self Care | Source: Ambulatory Visit | Attending: Urology | Admitting: Urology

## 2024-06-05 ENCOUNTER — Other Ambulatory Visit: Payer: Self-pay

## 2024-06-05 ENCOUNTER — Encounter (HOSPITAL_COMMUNITY): Payer: Self-pay

## 2024-06-05 VITALS — BP 130/88 | HR 72 | Temp 98.0°F | Resp 18 | Ht 62.5 in | Wt 236.0 lb

## 2024-06-05 DIAGNOSIS — I10 Essential (primary) hypertension: Secondary | ICD-10-CM | POA: Insufficient documentation

## 2024-06-05 DIAGNOSIS — N201 Calculus of ureter: Secondary | ICD-10-CM | POA: Diagnosis not present

## 2024-06-05 DIAGNOSIS — I2510 Atherosclerotic heart disease of native coronary artery without angina pectoris: Secondary | ICD-10-CM | POA: Diagnosis not present

## 2024-06-05 DIAGNOSIS — Z7985 Long-term (current) use of injectable non-insulin antidiabetic drugs: Secondary | ICD-10-CM | POA: Diagnosis not present

## 2024-06-05 DIAGNOSIS — Z794 Long term (current) use of insulin: Secondary | ICD-10-CM | POA: Insufficient documentation

## 2024-06-05 DIAGNOSIS — I4510 Unspecified right bundle-branch block: Secondary | ICD-10-CM | POA: Insufficient documentation

## 2024-06-05 DIAGNOSIS — E1169 Type 2 diabetes mellitus with other specified complication: Secondary | ICD-10-CM | POA: Insufficient documentation

## 2024-06-05 DIAGNOSIS — I700 Atherosclerosis of aorta: Secondary | ICD-10-CM | POA: Diagnosis not present

## 2024-06-05 DIAGNOSIS — Z01812 Encounter for preprocedural laboratory examination: Secondary | ICD-10-CM | POA: Diagnosis present

## 2024-06-05 DIAGNOSIS — Z7984 Long term (current) use of oral hypoglycemic drugs: Secondary | ICD-10-CM | POA: Insufficient documentation

## 2024-06-05 HISTORY — DX: Atherosclerotic heart disease of native coronary artery without angina pectoris: I25.10

## 2024-06-05 HISTORY — DX: Unspecified right bundle-branch block: I45.10

## 2024-06-05 LAB — GLUCOSE, CAPILLARY: Glucose-Capillary: 104 mg/dL — ABNORMAL HIGH (ref 70–99)

## 2024-06-05 LAB — CBC
HCT: 45.3 % (ref 36.0–46.0)
Hemoglobin: 15.1 g/dL — ABNORMAL HIGH (ref 12.0–15.0)
MCH: 30.9 pg (ref 26.0–34.0)
MCHC: 33.3 g/dL (ref 30.0–36.0)
MCV: 92.8 fL (ref 80.0–100.0)
Platelets: 416 10*3/uL — ABNORMAL HIGH (ref 150–400)
RBC: 4.88 MIL/uL (ref 3.87–5.11)
RDW: 12.7 % (ref 11.5–15.5)
WBC: 13.6 10*3/uL — ABNORMAL HIGH (ref 4.0–10.5)
nRBC: 0 % (ref 0.0–0.2)

## 2024-06-05 LAB — HEMOGLOBIN A1C
Hgb A1c MFr Bld: 5.2 % (ref 4.8–5.6)
Mean Plasma Glucose: 102.54 mg/dL

## 2024-06-08 NOTE — Progress Notes (Signed)
 Anesthesia Chart Review   Case: [id number] Date/Time: 06/09/24 0815   Procedure: CYSTOSCOPY/URETEROSCOPY/HOLMIUM LASER/STENT PLACEMENT (Right) - CYSTOSCOPY WITH RIGHT URETEROSCOPY, LASER LITHOTRIPSY, STENT PLACEMENT   Anesthesia type: General   Diagnosis: Ureteral stone [N20.1]   Pre-op diagnosis: RIGHT URETERAL STONE   Location: [facility] ROOM 01 / [facility]   Surgeons: Adelbert Homans, MD       DISCUSSION:62 y.o. smoker with h/o HTN, DM II, CAD, right ureteral stone scheduled for above procedure 06/09/2024 with Dr. Veryl Gottron Sherrine Dolly.   Pt follows with cardiology due to HTN, hyperlipidemia, Aortic atherosclerosis, incomplete RBBB.  Last seen by cardio 01/10/2024.  Pt asymptomatic at this visit.  1 year follow up recommended.    Low risk stress test 2021.  VS: BP 130/88   Pulse 72   Temp 36.7 C (Oral)   Resp 18   Ht 5' 2.5" (1.588 m)   Wt 107 kg   SpO2 98%   BMI 42.48 kg/m   PROVIDERS: Patient, No Pcp Per  Cardiologist - Gloriann Larger, MD  LABS: Labs reviewed: Acceptable for surgery. (all labs ordered are listed, but only abnormal results are displayed)  Labs Reviewed  CBC - Abnormal; Notable for the following components:      Result Value   WBC 13.6 (*)    Hemoglobin 15.1 (*)    Platelets 416 (*)    All other components within normal limits  GLUCOSE, CAPILLARY - Abnormal; Notable for the following components:   Glucose-Capillary 104 (*)    All other components within normal limits  HEMOGLOBIN A1C     IMAGES:   EKG:   CV: Echo 10/04/2020 1. Left ventricular ejection fraction, by estimation, is 65 to 70%. The  left ventricle has normal function. The left ventricle has no regional  wall motion abnormalities. There is mild concentric left ventricular  hypertrophy. Left ventricular diastolic  parameters are consistent with Grade I diastolic dysfunction (impaired  relaxation).   2. Right ventricular systolic function is normal. The right ventricular   size is normal.   3. The mitral valve is normal in structure. Mild mitral valve  regurgitation. No evidence of mitral stenosis.   4. The aortic valve is normal in structure. Aortic valve regurgitation is  not visualized. No aortic stenosis is present.   5. The inferior vena cava is normal in size with greater than 50%  respiratory variability, suggesting right atrial pressure of 3 mmHg.   Myocardial Perfusion 09/28/2020 The left ventricular ejection fraction is hyperdynamic (>65%). Nuclear stress EF: 67%. No T wave inversion was noted during stress. There was no ST segment deviation noted during stress. Defect 1: There is a small defect of mild severity. This is a low risk study.   Small size, mild intensity apical perfusion defect, improved with stress upright imaging, suggestive of attenuation artifact. No significant reversible ischemia. LVEF 67% with normal wall motion. This is a low risk study. Past Medical History:  Diagnosis Date   Abdominal hernia    Abscess    chest wall, right breast   Cellulitis    Coronary artery disease    Diabetes (HCC)    History of kidney stones    Hyperglycemia due to type 2 diabetes mellitus (HCC) 06/05/2015   Hypertension    Hypothyroid    2016   Incomplete RBBB    Intertrigo    Morbid obesity due to excess calories (HCC) 06/05/2015   Multiple food allergies    Shortness of breath    Tobacco  use     Past Surgical History:  Procedure Laterality Date   IR GENERIC HISTORICAL  12/23/2016   IR NEPHROSTOMY PLACEMENT LEFT 12/23/2016 Lucinda Saber, MD [facility]   NEPHROLITHOTOMY Left 01/28/2017   Procedure: LEFT NEPHROLITHOTOMY PERCUTANEOUS;  Surgeon: Andrez Banker, MD;  Location: [facility];  Service: Urology;  Laterality: Left;   NEPHROSTOMY TUBE PLACEMENT ([facility] HX)  12/23/2016    MEDICATIONS:  amoxicillin -clavulanate (AUGMENTIN ) 875-125 MG tablet   atorvastatin  (LIPITOR) 20 MG tablet   cephALEXin  (KEFLEX ) 500 MG capsule   Insulin   Pen Needle (BD PEN NEEDLE NANO 2ND GEN) 32G X 4 MM MISC   ketorolac  (TORADOL ) 10 MG tablet   levocetirizine (XYZAL) 5 MG tablet   levothyroxine  (SYNTHROID ) 125 MCG tablet   metFORMIN  (GLUCOPHAGE ) 500 MG tablet   metoprolol succinate (TOPROL-XL) 50 MG 24 hr tablet   nystatin  (MYCOSTATIN /NYSTOP ) powder   nystatin  cream (MYCOSTATIN )   ondansetron  (ZOFRAN -ODT) 4 MG disintegrating tablet   oxyCODONE -acetaminophen  (PERCOCET/ROXICET) 5-325 MG tablet   polyethylene glycol (MIRALAX  / GLYCOLAX ) 17 g packet   Semaglutide ,0.25 or 0.5MG /DOS, (OZEMPIC , 0.25 OR 0.5 MG/DOSE,) 2 MG/3ML SOPN   Vitamin D , Ergocalciferol , (DRISDOL ) 1.25 MG (50000 UNIT) CAPS capsule   No current facility-administered medications for this encounter.     Chick Cotton Ward, PA-C [facility] Pre-Surgical Testing [phone]

## 2024-06-08 NOTE — H&P (Signed)
 Office Visit Report     05/28/2024   --------------------------------------------------------------------------------   Tracy Graves. [name]  MRN: [id number]  DOB: Jul 24, 1961, 63 year old Female  SSN: **[id number]   PRIMARY CARE:     REFERRING:  Andrez Banker, MD  PROVIDER:  Yevonne Heman, M.D.  LOCATION:  [facility] [phone]     --------------------------------------------------------------------------------   CC: I have pain in the flank.  HPI: Tracy Graves is a 63 year-old female patient who was referred by Dr. Andrez Banker, MD who is here for flank pain.  The problem is on the right side. Her pain started about 05/27/2024. The pain is sharp. The pain is intermittent. The pain does radiate.   Pain medications< makes the pain better. Nothing causes the pain to become worse. She was treated with the following pain medication(s): Oxycodone .   She has had this same pain previously. She has had kidney stones.   -CTSS from 05/27/24 showed a 10 mm right distal ureteral stone associated with mild hydronephrosis.  -Hx of left PCNL with Dr. Dulcy Gibney in 2018  -Today, she reports improvement in her flank pain and denies interval stone passage, nausea/vomiting, fever/chills, dysuria or hematuria.     ALLERGIES: aleve Peanut    MEDICATIONS: metFORMIN  HCl 500 MG Tablet  Atorvastatin  Calcium  20 MG Tablet  Levocetirizine Dihydrochloride 5 MG Tablet  Metoprolol Succinate ER 50 MG Tablet Extended Release 24 Hour  MiraLax  17 GM/SCOOP Powder  Ondansetron  4 MG Tablet Disintegrating  oxyCODONE -Acetaminophen  5-325 MG Tablet  Ozempic  (0.25 or 0.5 MG/DOSE) 2 MG/3ML Solution Pen-injector  Vitamin D  (Ergocalciferol ) 1.25 MG (50000 UT) Capsule     GU PSH: Cysto Remove Stent FB Sim - 2018 Percut Stone Removal >2cm - 2018     NON-GU PSH: No Non-GU PSH    GU PMH: Ureteral calculus - 2018, - 2018    NON-GU PMH: Diabetes Type 2 Hypertension Hypothyroidism     FAMILY HISTORY: 1 Daughter - Other Cancer - Mother, Aunt, Grandmother, Grandfather cardiac disorder - Father Death of family member - Mother, Father   SOCIAL HISTORY: Marital Status: Widowed Preferred Language: English; Ethnicity: Not Hispanic Or Latino; Race: White Current Smoking Status: Patient does not smoke anymore.   Tobacco Use Assessment Completed: Used Tobacco in last 30 days? Has never drank.  Does not drink caffeine. Patient's occupation is/was unemployeed.     Notes: Pt reports on her H&P that she has been a smoker and is currently "on the patch." Patient drinks 1 cup of coffee a week (nothing daily).   REVIEW OF SYSTEMS:    GU Review Female:   Patient denies frequent urination, hard to postpone urination, burning /pain with urination, get up at night to urinate, leakage of urine, stream starts and stops, trouble starting your stream, have to strain to urinate, and being pregnant.  Gastrointestinal (Upper):   Patient denies nausea, vomiting, and indigestion/ heartburn.  Gastrointestinal (Lower):   Patient denies diarrhea and constipation.  Constitutional:   Patient denies fever, night sweats, weight loss, and fatigue.  Skin:   Patient denies skin rash/ lesion and itching.  Eyes:   Patient denies blurred vision and double vision.  Ears/ Nose/ Throat:   Patient denies sore throat and sinus problems.  Hematologic/Lymphatic:   Patient denies swollen glands and easy bruising.  Cardiovascular:   Patient denies leg swelling and chest pains.  Respiratory:   Patient denies cough and shortness of breath.  Endocrine:   Patient denies excessive thirst.  Musculoskeletal:  Patient denies back pain and joint pain.  Neurological:   Patient denies headaches and dizziness.  Psychologic:   Patient denies depression and anxiety.   VITAL SIGNS:      05/28/2024 01:27 PM  Weight 235 lb / 106.59 kg  Height 62.5 in / 158.75 cm  BP 113/76 mmHg  Pulse 83 /min  Temperature 97.5 F / 36.3 C   BMI 42.3 kg/m   MULTI-SYSTEM PHYSICAL EXAMINATION:    Constitutional: Well-nourished. No physical deformities. Normally developed. Good grooming.  Neurologic / Psychiatric: Oriented to time, oriented to place, oriented to person. No depression, no anxiety, no agitation.     Complexity of Data:  Lab Test Review:   BMP, CBC with Diff  Records Review:   Previous Doctor Records, Previous Hospital Records  Urine Test Review:   Urinalysis  X-Ray Review: C.T. Abdomen/Pelvis: Reviewed Films. Reviewed Report. Discussed With Patient.    Notes:                     CLINICAL DATA: Abdominal/flank pain. Concern for kidney stone.   EXAM:  CT ABDOMEN AND PELVIS WITHOUT CONTRAST   TECHNIQUE:  Multidetector CT imaging of the abdomen and pelvis was performed  following the standard protocol without IV contrast.   RADIATION DOSE REDUCTION: This exam was performed according to the  departmental dose-optimization program which includes automated  exposure control, adjustment of the mA and/or kV according to  patient size and/or use of iterative reconstruction technique.   COMPARISON: CT abdomen pelvis dated 01/28/2017.   FINDINGS:  Evaluation of this exam is limited in the absence of intravenous  contrast.   Lower chest: The visualized lung bases are clear.   No intra-abdominal free air or free fluid.   Hepatobiliary: The liver is unremarkable. No biliary dilatation.  Multiple gallstones. No pericholecystic fluid or evidence of acute  cholecystitis by CT.   Pancreas: Unremarkable. No pancreatic ductal dilatation or  surrounding inflammatory changes.   Spleen: Normal in size without focal abnormality.   Adrenals/Urinary Tract: The adrenal glands are unremarkable. There  is a 1 cm stone in the distal right ureter. There is mild right  hydronephrosis. The left kidney, left ureter, and urinary bladder  appear unremarkable.   Stomach/Bowel: There is a large hiatal hernial with herniation  of  the stomach. There is sigmoid diverticulosis. There is no bowel  obstruction or active inflammation. The appendix is normal.   Vascular/Lymphatic: Moderate aortoiliac atherosclerotic disease. The  IVC is unremarkable. No portal gas. There is no adenopathy.   Reproductive: Multiple uterine fibroids. No suspicious adnexal  masses.   Other: None   Musculoskeletal: No acute osseous pathology.   IMPRESSION:  1. A 1 cm distal right ureteral stone with mild right  hydronephrosis.  2. Cholelithiasis.  3. Sigmoid diverticulosis. No bowel obstruction. Normal appendix.  4. Large hiatal hernia.  5. Aortic Atherosclerosis (ICD10-I70.0).    Electronically Signed  By: Angus Bark M.D.  On: 05/27/2024 13:49     PROCEDURES:          Urinalysis w/Scope Dipstick Dipstick Cont'd Micro  Color: Yellow Bilirubin: Neg mg/dL WBC/hpf: 10 - 16/XWR  Appearance: Cloudy Ketones: Neg mg/dL RBC/hpf: 10 - 60/AVW  Specific Gravity: 1.025 Blood: 3+ ery/uL Bacteria: Mod (26-50/hpf)  pH: 6.0 Protein: 2+ mg/dL Cystals: NS (Not Seen)  Glucose: Neg mg/dL Urobilinogen: 1.0 mg/dL Casts: NS (Not Seen)    Nitrites: Neg Trichomonas: Not Present    Leukocyte Esterase: 1+ leu/uL Mucous: Present  Epithelial Cells: 10 - 20/hpf      Yeast: NS (Not Seen)      Sperm: Not Present    ASSESSMENT:      ICD-10 Details  1 GU:   Ureteral calculus - N20.1 Right, Undiagnosed New Problem  2   Ureteral obstruction secondary to calculous - N13.2 Right, Undiagnosed New Problem  3   Flank Pain - R10.84    PLAN:            Medications New Meds: Cephalexin  500 MG Capsule 1 capsule PO BID   #14  0 Refill(s)  Pharm[facility] Name:  [facility] [id number]  Address:  [address]   [place], [place] [address]  Phone:  [phone]  Fax:  [phone]            Orders Labs Urine Culture          Schedule Return Visit/Planned Activity: Next Available Appointment - Schedule Surgery           Document Letter(s):  Created for Patient: Clinical Summary         Notes:   The risks, benefits and alternatives of cystoscopy with RIGHT ureteroscopy, laser lithotripsy and ureteral stent placement was discussed the patient. Risks included, but are not limited to: bleeding, urinary tract infection, ureteral injury/avulsion, ureteral stricture formation, retained stone fragments, the possibility that multiple surgeries may be required to treat the stone(s), MI, stroke, PE and the inherent risks of general anesthesia. The patient voices understanding and wishes to proceed.   We discussed criteria to return to clinic or proceed to the ER which include: Fever/chills, worsening pain, nausea/vomiting and/or persistent gross hematuria.

## 2024-06-09 ENCOUNTER — Encounter (HOSPITAL_COMMUNITY): Payer: Self-pay | Admitting: Urology

## 2024-06-09 ENCOUNTER — Other Ambulatory Visit: Payer: Self-pay

## 2024-06-09 ENCOUNTER — Ambulatory Visit (HOSPITAL_COMMUNITY)
Admission: RE | Admit: 2024-06-09 | Discharge: 2024-06-09 | Disposition: A | Discharge status: Home / Self Care | Source: Ambulatory Visit | Attending: Urology | Admitting: Urology

## 2024-06-09 ENCOUNTER — Ambulatory Visit (HOSPITAL_COMMUNITY): Payer: Self-pay | Admitting: Anesthesiology

## 2024-06-09 ENCOUNTER — Ambulatory Visit (HOSPITAL_COMMUNITY)

## 2024-06-09 ENCOUNTER — Encounter (HOSPITAL_COMMUNITY)
Admission: RE | Disposition: A | Discharge status: Home / Self Care | Payer: Self-pay | Source: Ambulatory Visit | Attending: Urology

## 2024-06-09 DIAGNOSIS — Z87891 Personal history of nicotine dependence: Secondary | ICD-10-CM | POA: Diagnosis not present

## 2024-06-09 DIAGNOSIS — Z7984 Long term (current) use of oral hypoglycemic drugs: Secondary | ICD-10-CM | POA: Insufficient documentation

## 2024-06-09 DIAGNOSIS — E119 Type 2 diabetes mellitus without complications: Secondary | ICD-10-CM | POA: Insufficient documentation

## 2024-06-09 DIAGNOSIS — Z6841 Body Mass Index (BMI) 40.0 and over, adult: Secondary | ICD-10-CM | POA: Diagnosis not present

## 2024-06-09 DIAGNOSIS — I700 Atherosclerosis of aorta: Secondary | ICD-10-CM | POA: Insufficient documentation

## 2024-06-09 DIAGNOSIS — N135 Crossing vessel and stricture of ureter without hydronephrosis: Secondary | ICD-10-CM

## 2024-06-09 DIAGNOSIS — N201 Calculus of ureter: Secondary | ICD-10-CM | POA: Diagnosis not present

## 2024-06-09 DIAGNOSIS — I2510 Atherosclerotic heart disease of native coronary artery without angina pectoris: Secondary | ICD-10-CM

## 2024-06-09 DIAGNOSIS — E039 Hypothyroidism, unspecified: Secondary | ICD-10-CM | POA: Insufficient documentation

## 2024-06-09 DIAGNOSIS — E66813 Obesity, class 3: Secondary | ICD-10-CM | POA: Insufficient documentation

## 2024-06-09 DIAGNOSIS — N132 Hydronephrosis with renal and ureteral calculous obstruction: Secondary | ICD-10-CM | POA: Insufficient documentation

## 2024-06-09 DIAGNOSIS — I10 Essential (primary) hypertension: Secondary | ICD-10-CM | POA: Insufficient documentation

## 2024-06-09 DIAGNOSIS — Z87442 Personal history of urinary calculi: Secondary | ICD-10-CM | POA: Insufficient documentation

## 2024-06-09 DIAGNOSIS — K8020 Calculus of gallbladder without cholecystitis without obstruction: Secondary | ICD-10-CM | POA: Diagnosis not present

## 2024-06-09 DIAGNOSIS — G4730 Sleep apnea, unspecified: Secondary | ICD-10-CM | POA: Diagnosis not present

## 2024-06-09 DIAGNOSIS — K449 Diaphragmatic hernia without obstruction or gangrene: Secondary | ICD-10-CM | POA: Insufficient documentation

## 2024-06-09 DIAGNOSIS — K5730 Diverticulosis of large intestine without perforation or abscess without bleeding: Secondary | ICD-10-CM | POA: Insufficient documentation

## 2024-06-09 DIAGNOSIS — E1169 Type 2 diabetes mellitus with other specified complication: Secondary | ICD-10-CM

## 2024-06-09 HISTORY — PX: CYSTOSCOPY/URETEROSCOPY/HOLMIUM LASER/STENT PLACEMENT: SHX6546

## 2024-06-09 LAB — GLUCOSE, CAPILLARY
Glucose-Capillary: 95 mg/dL (ref 70–99)
Glucose-Capillary: 97 mg/dL (ref 70–99)

## 2024-06-09 SURGERY — CYSTOSCOPY/URETEROSCOPY/HOLMIUM LASER/STENT PLACEMENT
Anesthesia: General | Laterality: Right

## 2024-06-09 MED ORDER — ORAL CARE MOUTH RINSE: 15.0000 mL | Freq: Once | OROMUCOSAL | Status: AC

## 2024-06-09 MED ORDER — ONDANSETRON HCL 4 MG/2ML IJ SOLN
INTRAMUSCULAR | Status: AC
  Filled 2024-06-09: qty 2

## 2024-06-09 MED ORDER — OXYCODONE HCL 5 MG/5ML PO SOLN: 5.0000 mg | Freq: Once | ORAL | Status: DC | PRN

## 2024-06-09 MED ORDER — GLYCOPYRROLATE 0.2 MG/ML IJ SOLN
INTRAMUSCULAR | Status: DC | PRN
  Administered 2024-06-09: .2 mg via INTRAVENOUS

## 2024-06-09 MED ORDER — SODIUM CHLORIDE 0.9 % IR SOLN
Status: DC | PRN
Start: 2024-06-09 — End: 2024-06-09
  Administered 2024-06-09: 3000 mL via INTRAVESICAL

## 2024-06-09 MED ORDER — ACETAMINOPHEN 500 MG PO TABS
1000.0000 mg | ORAL_TABLET | Freq: Once | ORAL | Status: AC
  Administered 2024-06-09: 1000 mg via ORAL
  Filled 2024-06-09: qty 2

## 2024-06-09 MED ORDER — CEFAZOLIN SODIUM-DEXTROSE 2-4 GM/100ML-% IV SOLN
2.0000 g | INTRAVENOUS | Status: AC
  Administered 2024-06-09: 2 g via INTRAVENOUS
  Filled 2024-06-09: qty 100

## 2024-06-09 MED ORDER — DROPERIDOL 2.5 MG/ML IJ SOLN: 0.6250 mg | Freq: Once | INTRAMUSCULAR | Status: DC | PRN

## 2024-06-09 MED ORDER — LIDOCAINE HCL (PF) 2 % IJ SOLN
INTRAMUSCULAR | Status: AC
  Filled 2024-06-09: qty 5

## 2024-06-09 MED ORDER — GLYCOPYRROLATE 0.2 MG/ML IJ SOLN
INTRAMUSCULAR | Status: AC
  Filled 2024-06-09: qty 1

## 2024-06-09 MED ORDER — FENTANYL CITRATE PF 50 MCG/ML IJ SOSY: 25.0000 ug | PREFILLED_SYRINGE | INTRAMUSCULAR | Status: DC | PRN

## 2024-06-09 MED ORDER — EPHEDRINE 5 MG/ML INJ
INTRAVENOUS | Status: AC
  Filled 2024-06-09: qty 5

## 2024-06-09 MED ORDER — OXYBUTYNIN CHLORIDE 5 MG PO TABS: 5.0000 mg | ORAL_TABLET | Freq: Three times a day (TID) | ORAL | 1 refills | Status: DC | PRN

## 2024-06-09 MED ORDER — INSULIN ASPART 100 UNIT/ML IJ SOLN: 0.0000 [IU] | INTRAMUSCULAR | Status: DC | PRN

## 2024-06-09 MED ORDER — PROPOFOL 10 MG/ML IV BOLUS
INTRAVENOUS | Status: AC
  Filled 2024-06-09: qty 20

## 2024-06-09 MED ORDER — ONDANSETRON HCL 4 MG/2ML IJ SOLN
INTRAMUSCULAR | Status: DC | PRN
  Administered 2024-06-09: 4 mg via INTRAVENOUS

## 2024-06-09 MED ORDER — FENTANYL CITRATE (PF) 100 MCG/2ML IJ SOLN
INTRAMUSCULAR | Status: AC
  Filled 2024-06-09: qty 2

## 2024-06-09 MED ORDER — PROPOFOL 10 MG/ML IV BOLUS
INTRAVENOUS | Status: DC | PRN
  Administered 2024-06-09: 20 mg via INTRAVENOUS
  Administered 2024-06-09: 180 mg via INTRAVENOUS

## 2024-06-09 MED ORDER — FENTANYL CITRATE (PF) 100 MCG/2ML IJ SOLN
INTRAMUSCULAR | Status: DC | PRN
  Administered 2024-06-09 (×3): 25 ug via INTRAVENOUS

## 2024-06-09 MED ORDER — DEXAMETHASONE SODIUM PHOSPHATE 10 MG/ML IJ SOLN
INTRAMUSCULAR | Status: AC
Start: 2024-06-09 — End: ?
  Filled 2024-06-09: qty 1

## 2024-06-09 MED ORDER — LIDOCAINE HCL (CARDIAC) PF 100 MG/5ML IV SOSY
PREFILLED_SYRINGE | INTRAVENOUS | Status: DC | PRN
  Administered 2024-06-09: 100 mg via INTRAVENOUS

## 2024-06-09 MED ORDER — PHENAZOPYRIDINE HCL 200 MG PO TABS: 200.0000 mg | ORAL_TABLET | Freq: Three times a day (TID) | ORAL | 0 refills | Status: DC | PRN

## 2024-06-09 MED ORDER — CHLORHEXIDINE GLUCONATE 0.12 % MT SOLN
15.0000 mL | Freq: Once | OROMUCOSAL | Status: AC
  Administered 2024-06-09: 15 mL via OROMUCOSAL

## 2024-06-09 MED ORDER — DEXAMETHASONE SODIUM PHOSPHATE 10 MG/ML IJ SOLN
INTRAMUSCULAR | Status: DC | PRN
Start: 2024-06-09 — End: 2024-06-09
  Administered 2024-06-09: 5 mg via INTRAVENOUS

## 2024-06-09 MED ORDER — OXYCODONE HCL 5 MG PO TABS: 5.0000 mg | ORAL_TABLET | Freq: Once | ORAL | Status: DC | PRN

## 2024-06-09 MED ORDER — LACTATED RINGERS IV SOLN: INTRAVENOUS | Status: DC

## 2024-06-09 MED ORDER — EPHEDRINE SULFATE-NACL 50-0.9 MG/10ML-% IV SOSY
PREFILLED_SYRINGE | INTRAVENOUS | Status: DC | PRN
Start: 2024-06-09 — End: 2024-06-09
  Administered 2024-06-09 (×2): 5 mg via INTRAVENOUS

## 2024-06-09 MED ORDER — PHENYLEPHRINE 80 MCG/ML (10ML) SYRINGE FOR IV PUSH (FOR BLOOD PRESSURE SUPPORT)
PREFILLED_SYRINGE | INTRAVENOUS | Status: DC | PRN
  Administered 2024-06-09: 40 ug via INTRAVENOUS
  Administered 2024-06-09 (×2): 80 ug via INTRAVENOUS
  Administered 2024-06-09: 160 ug via INTRAVENOUS
  Administered 2024-06-09: 80 ug via INTRAVENOUS
  Administered 2024-06-09: 120 ug via INTRAVENOUS
  Administered 2024-06-09: 80 ug via INTRAVENOUS

## 2024-06-09 MED ORDER — PHENYLEPHRINE 80 MCG/ML (10ML) SYRINGE FOR IV PUSH (FOR BLOOD PRESSURE SUPPORT)
PREFILLED_SYRINGE | INTRAVENOUS | Status: AC
Start: 2024-06-09 — End: ?
  Filled 2024-06-09: qty 10

## 2024-06-09 MED ORDER — IOHEXOL 300 MG/ML  SOLN
INTRAMUSCULAR | Status: DC | PRN
  Administered 2024-06-09: 30 mL

## 2024-06-09 MED ORDER — DEXAMETHASONE SODIUM PHOSPHATE 10 MG/ML IJ SOLN
INTRAMUSCULAR | Status: AC
  Filled 2024-06-09: qty 1

## 2024-06-09 SURGICAL SUPPLY — 18 items
BAG URO CATCHER STRL LF (MISCELLANEOUS) ×2 IMPLANT
BASKET ZERO TIP NITINOL 2.4FR (BASKET) IMPLANT
CATH URETL OPEN 5X70 (CATHETERS) ×2 IMPLANT
CLOTH BEACON ORANGE TIMEOUT ST (SAFETY) ×2 IMPLANT
EXTRACTOR STONE NITINOL NGAGE (UROLOGICAL SUPPLIES) IMPLANT
FIBER LASER MOSES 200 DFL (Laser) IMPLANT
GLOVE SURG LX STRL 8.0 MICRO (GLOVE) ×2 IMPLANT
GOWN STRL SURGICAL XL XLNG (GOWN DISPOSABLE) ×2 IMPLANT
GUIDEWIRE STR DUAL SENSOR (WIRE) IMPLANT
GUIDEWIRE ZIPWRE .038 STRAIGHT (WIRE) ×2 IMPLANT
KIT TURNOVER KIT A (KITS) IMPLANT
MANIFOLD NEPTUNE II (INSTRUMENTS) ×2 IMPLANT
PACK CYSTO (CUSTOM PROCEDURE TRAY) ×2 IMPLANT
SHEATH NAVIGATOR HD 11/13X36 (SHEATH) IMPLANT
STENT URET 6FRX24 CONTOUR (STENTS) IMPLANT
STENT URET 6FRX26 CONTOUR (STENTS) IMPLANT
TUBING CONNECTING 10 (TUBING) ×2 IMPLANT
TUBING UROLOGY SET (TUBING) ×2 IMPLANT

## 2024-06-09 NOTE — Anesthesia Postprocedure Evaluation (Signed)
 Anesthesia Post Note  Patient: Tracy Graves  Procedure(s) Performed: CYSTOSCOPY/URETEROSCOPY/HOLMIUM LASER/STENT PLACEMENT (Right)     Patient location during evaluation: PACU Anesthesia Type: General Level of consciousness: awake and alert Pain management: pain level controlled Vital Signs Assessment: post-procedure vital signs reviewed and stable Respiratory status: spontaneous breathing, nonlabored ventilation and respiratory function stable Cardiovascular status: blood pressure returned to baseline Postop Assessment: no apparent nausea or vomiting Anesthetic complications: no   No notable events documented.  Last Vitals:  Vitals:   06/09/24 1030 06/09/24 1039  BP: (!) 140/91 (!) 155/98  Pulse: 70 72  Resp: (!) 22 20  Temp: (!) 36.4 C   SpO2: 92% 94%    Last Pain:  Vitals:   06/09/24 1039  TempSrc:   PainSc: 0-No pain                 Rayfield Cairo

## 2024-06-09 NOTE — Anesthesia Preprocedure Evaluation (Addendum)
 Anesthesia Evaluation  Patient identified by MRN, date of birth, ID band Patient awake    Reviewed: Allergy & Precautions, NPO status , Patient's Chart, lab work & pertinent test results, reviewed documented beta blocker date and time   History of Anesthesia Complications Negative for: history of anesthetic complications  Airway Mallampati: II  TM Distance: >3 FB Neck ROM: Full    Dental  (+) Missing,    Pulmonary sleep apnea , Current Smoker and Patient abstained from smoking.   Pulmonary exam normal        Cardiovascular hypertension, Pt. on medications and Pt. on home beta blockers + CAD  Normal cardiovascular exam     Neuro/Psych negative neurological ROS     GI/Hepatic negative GI ROS, Neg liver ROS,,,  Endo/Other  diabetes (on Ozempic ), Type 2, Oral Hypoglycemic AgentsHypothyroidism  Class 3 obesity  Renal/GU RIGHT URETERAL STONE     Musculoskeletal negative musculoskeletal ROS (+)    Abdominal   Peds  Hematology negative hematology ROS (+)   Anesthesia Other Findings Day of surgery medications reviewed with patient.  Reproductive/Obstetrics                             Anesthesia Physical Anesthesia Plan  ASA: 3  Anesthesia Plan: General   Post-op Pain Management: Tylenol  PO (pre-op)*   Induction: Intravenous  PONV Risk Score and Plan: 3 and Treatment may vary due to age or medical condition, Ondansetron , Dexamethasone  and Midazolam   Airway Management Planned: LMA  Additional Equipment: None  Intra-op Plan:   Post-operative Plan: Extubation in OR  Informed Consent: I have reviewed the patients History and Physical, chart, labs and discussed the procedure including the risks, benefits and alternatives for the proposed anesthesia with the patient or authorized representative who has indicated his/her understanding and acceptance.     Dental advisory given  Plan  Discussed with: CRNA  Anesthesia Plan Comments:        Anesthesia Quick Evaluation

## 2024-06-09 NOTE — Anesthesia Procedure Notes (Signed)
 Procedure Name: LMA Insertion Date/Time: 06/09/2024 9:03 AM  Performed by: Nola Battiest, CRNAPre-anesthesia Checklist: Patient identified, Emergency Drugs available, Suction available and Patient being monitored Patient Re-evaluated:Patient Re-evaluated prior to induction Oxygen Delivery Method: Circle System Utilized Preoxygenation: Pre-oxygenation with 100% oxygen Induction Type: IV induction Ventilation: Mask ventilation without difficulty LMA: LMA inserted LMA Size: 4.0 Number of attempts: 1 Airway Equipment and Method: Bite block Placement Confirmation: positive ETCO2 Tube secured with: Tape Dental Injury: Teeth and Oropharynx as per pre-operative assessment

## 2024-06-09 NOTE — Op Note (Signed)
 Operative Note  Preoperative diagnosis:  1.  9 mm right distal ureteral stone  Postoperative diagnosis: 1.  Obstructing and impacted 9 mm right distal ureteral stone  Procedure(s): 1.  Cystoscopy with right ureteroscopy, holmium laser lithotripsy and right ureteral stent placement 2.  Right retrograde pyelogram with intraoperative interpretation fluoroscopic imaging  Surgeon: Yevonne Heman, MD  Assistants:  None  Anesthesia:  General  Complications:  None  EBL: Less than 5 mL  Specimens: 1.  Right ureteral stone fragments  Drains/Catheters: 1.  Right 6 Pesch, 24 cm JJ stent without tether  Intraoperative findings:   No intravesical or urethral abnormalities Right retrograde pyelogram revealed a filling defects within the distal aspects of the right distal ureter, consistent with the obstructing stone seen on recent cross-sectional imaging.  The remainder of the proximal ureter was uniformly dilated and somewhat tortuous with no other filling defects.  The right renal pelvis was also uniformly dilated with no filling defects.  Indication:  Tracy Graves is a 63 y.o. female with an obstructing 9 mm right distal ureteral stone.  She is here today for definitive stone treatment.  She has been consented for the above procedures, voices understanding and wishes to proceed.  Description of procedure:  After informed consent was obtained, the patient was brought to the operating room and general LMA anesthesia was administered. The patient was then placed in the dorsolithotomy position and prepped and draped in the usual sterile fashion. A timeout was performed. A 21 Stevison rigid cystoscope was then inserted into the urethral meatus and advanced into the bladder under direct vision. A complete bladder survey revealed no intravesical pathology.  A 5 Garfield ureteral catheter was then inserted into the right ureteral orifice and a retrograde pyelogram was obtained, with the findings  listed above.  A Glidewire was then used to intubate the lumen of the ureteral catheter and was advanced up to the right renal pelvis, under fluoroscopic guidance.  The catheter was then removed, leaving the wire in place.  A semirigid ureteroscope was then advanced into the right ureteral orifice and up to the level of her obstructing stone.  A 200 m holmium laser was then used to fracture the stone into numerous smaller pieces.  A 0 tip basket was then used to extract all stone fragments from the lumen of the right ureter.  The semirigid ureteroscope was then removed and replaced with a 21 Bartles rigid cystoscope over the Glidewire.  A 6 Lapoint, 24 cm JJ stent was then placed over the Glidewire and into good position within the right collecting system, confirming placement via fluoroscopy.  The patient's bladder was drained.  She tolerated the procedure well and was transferred to the postanesthesia in stable condition.  Plan: Follow-up in 1 week for office cystoscopy and stent removal

## 2024-06-09 NOTE — Transfer of Care (Signed)
 Immediate Anesthesia Transfer of Care Note  Patient: Tracy Graves  Procedure(s) Performed: CYSTOSCOPY/URETEROSCOPY/HOLMIUM LASER/STENT PLACEMENT (Right)  Patient Location: PACU  Anesthesia Type:General  Level of Consciousness: awake, alert , oriented, and patient cooperative  Airway & Oxygen Therapy: Patient Spontanous Breathing  Post-op Assessment: Report given to RN and Post -op Vital signs reviewed and stable  Post vital signs: Reviewed and stable  Last Vitals:  Vitals Value Taken Time  BP 110/80 06/09/24 0955  Temp    Pulse 86 06/09/24 0954  Resp 19 06/09/24 0954  SpO2 97 % 06/09/24 0954  Vitals shown include unfiled device data.  Last Pain:  Vitals:   06/09/24 0701  TempSrc:   PainSc: 0-No pain         Complications: No notable events documented.

## 2024-06-10 ENCOUNTER — Encounter (HOSPITAL_COMMUNITY): Payer: Self-pay | Admitting: Urology

## 2024-06-10 ENCOUNTER — Other Ambulatory Visit: Payer: Self-pay

## 2024-06-10 ENCOUNTER — Emergency Department (HOSPITAL_COMMUNITY)
Admission: EM | Admit: 2024-06-10 | Discharge: 2024-06-11 | Disposition: A | Discharge status: Home / Self Care | Attending: Emergency Medicine | Admitting: Emergency Medicine

## 2024-06-10 DIAGNOSIS — R300 Dysuria: Secondary | ICD-10-CM | POA: Insufficient documentation

## 2024-06-10 DIAGNOSIS — Z794 Long term (current) use of insulin: Secondary | ICD-10-CM | POA: Diagnosis not present

## 2024-06-10 DIAGNOSIS — D72829 Elevated white blood cell count, unspecified: Secondary | ICD-10-CM | POA: Insufficient documentation

## 2024-06-10 DIAGNOSIS — Z79899 Other long term (current) drug therapy: Secondary | ICD-10-CM | POA: Diagnosis not present

## 2024-06-10 DIAGNOSIS — Z7984 Long term (current) use of oral hypoglycemic drugs: Secondary | ICD-10-CM | POA: Diagnosis not present

## 2024-06-10 DIAGNOSIS — R319 Hematuria, unspecified: Secondary | ICD-10-CM | POA: Insufficient documentation

## 2024-06-10 DIAGNOSIS — E119 Type 2 diabetes mellitus without complications: Secondary | ICD-10-CM | POA: Insufficient documentation

## 2024-06-10 DIAGNOSIS — K3189 Other diseases of stomach and duodenum: Secondary | ICD-10-CM | POA: Diagnosis not present

## 2024-06-10 DIAGNOSIS — K8020 Calculus of gallbladder without cholecystitis without obstruction: Secondary | ICD-10-CM | POA: Diagnosis not present

## 2024-06-10 LAB — CBC
HCT: 43.9 % (ref 36.0–46.0)
Hemoglobin: 14.7 g/dL (ref 12.0–15.0)
MCH: 30.8 pg (ref 26.0–34.0)
MCHC: 33.5 g/dL (ref 30.0–36.0)
MCV: 91.8 fL (ref 80.0–100.0)
Platelets: 427 10*3/uL — ABNORMAL HIGH (ref 150–400)
RBC: 4.78 MIL/uL (ref 3.87–5.11)
RDW: 12.7 % (ref 11.5–15.5)
WBC: 12.7 10*3/uL — ABNORMAL HIGH (ref 4.0–10.5)
nRBC: 0 % (ref 0.0–0.2)

## 2024-06-10 LAB — BASIC METABOLIC PANEL WITH GFR
Anion gap: 8 (ref 5–15)
BUN: 17 mg/dL (ref 8–23)
CO2: 23 mmol/L (ref 22–32)
Calcium: 9.9 mg/dL (ref 8.9–10.3)
Chloride: 103 mmol/L (ref 98–111)
Creatinine, Ser: 1.08 mg/dL — ABNORMAL HIGH (ref 0.44–1.00)
GFR, Estimated: 58 mL/min — ABNORMAL LOW (ref >60)
Glucose, Bld: 104 mg/dL — ABNORMAL HIGH (ref 70–99)
Potassium: 4 mmol/L (ref 3.5–5.1)
Sodium: 134 mmol/L — ABNORMAL LOW (ref 135–145)

## 2024-06-10 LAB — URINALYSIS, ROUTINE W REFLEX MICROSCOPIC

## 2024-06-10 LAB — URINALYSIS, MICROSCOPIC (REFLEX)
Bacteria, UA: NONE SEEN
RBC / HPF: >50 RBC/hpf (ref 0–5)

## 2024-06-10 NOTE — ED Triage Notes (Signed)
 Pt had cystoscopy yesterday and has had some pain with urination today and noticed a small blood clot in urine tonight. Pt denies fevers, abdominal pain or flank pain.

## 2024-06-10 NOTE — ED Provider Notes (Signed)
 [place] [facility]GENC[facility] DEPARTMENT AT [facility] Provider Note   CSN: [id number] Arrival date & time: 06/10/24  2153     History  Chief Complaint  Patient presents with   Dysuria    Tracy Graves is a 63 y.o. female with medical history of tobacco abuse, diabetes, hydronephrosis, UTI, kidney stone.  Patient presents to ED for evaluation of hematuria, dysuria.  Patient reports that yesterday she had cystoscopy, ureteroscopy, lithotripsy of 10 mm distal right ureteral stone with right hydronephrosis.  Patient had stent placed.  She was discharged home in stable condition.  She reports that upon arriving home she has had progressively worsening dysuria and hematuria.  States that her urine was orange in appearance consistent with taking Pyridium  however upon arrival to the ED this evening her urine was very dark red.  She denies any blood thinners.  She denies any flank pain.  Denies fevers, nausea or vomiting at home.   Dysuria      Home Medications Prior to Admission medications   Medication Sig Start Date End Date Taking? Authorizing Provider  sulfamethoxazole -trimethoprim  (BACTRIM  DS) 800-160 MG tablet Take 1 tablet by mouth 2 (two) times daily for 14 days. 06/11/24 06/25/24 Yes Adel Aden, PA-C  atorvastatin  (LIPITOR) 20 MG tablet Take 1 tablet (20 mg total) by mouth daily. 01/10/24   Gerald Kitty., NP  Insulin  Pen Needle (BD PEN NEEDLE NANO 2ND GEN) 32G X 4 MM MISC 1 Package by Does not apply route 2 (two) times daily. 07/10/23   Jenean Minus, MD  ketorolac  (TORADOL ) 10 MG tablet Take 1 tablet (10 mg total) by mouth every 6 (six) hours as needed. 05/29/24   Lindle Rhea, MD  levocetirizine (XYZAL) 5 MG tablet Take 5 mg by mouth daily as needed for allergies. 04/24/24   [provider]  levothyroxine  (SYNTHROID ) 125 MCG tablet Take 125 mcg by mouth daily before breakfast.     [provider]  metFORMIN  (GLUCOPHAGE ) 500 MG tablet  Take 500 mg by mouth daily with breakfast.     [provider]  metoprolol succinate (TOPROL-XL) 50 MG 24 hr tablet Take 50 mg by mouth daily. 09/04/20   [provider]  nystatin  (MYCOSTATIN /NYSTOP ) powder Apply 1 Application topically daily as needed (irritation). 02/01/24   [provider]  nystatin  cream (MYCOSTATIN ) Apply 1 Application topically daily as needed (irritation). 02/01/24   [provider]  ondansetron  (ZOFRAN -ODT) 4 MG disintegrating tablet Take 1 tablet (4 mg total) by mouth every 8 (eight) hours as needed. 05/27/24   Mozell Arias, MD  oxybutynin  (DITROPAN ) 5 MG tablet Take 1 tablet (5 mg total) by mouth every 8 (eight) hours as needed for bladder spasms. 06/09/24   Adelbert Homans, MD  oxyCODONE -acetaminophen  (PERCOCET/ROXICET) 5-325 MG tablet Take 1-2 tablets by mouth every 8 (eight) hours as needed for severe pain (pain score 7-10). 05/27/24   Mozell Arias, MD  phenazopyridine  (PYRIDIUM ) 200 MG tablet Take 1 tablet (200 mg total) by mouth 3 (three) times daily as needed (for pain with urination). 06/09/24 06/09/25  Adelbert Homans, MD  polyethylene glycol (MIRALAX  / GLYCOLAX ) 17 g packet Take 17 g by mouth daily as needed for moderate constipation.    [provider]  Semaglutide ,0.25 or 0.5MG /DOS, (OZEMPIC , 0.25 OR 0.5 MG/DOSE,) 2 MG/3ML SOPN Inject 0.5 mg into the skin once a week. 04/06/24   Jenean Minus, MD  Vitamin D , Ergocalciferol , (DRISDOL ) 1.25 MG (50000 UNIT) CAPS  capsule Take 1 capsule (50,000 Units total) by mouth every 7 (seven) days. 03/09/24   Jenean Minus, MD      Allergies    Fish allergy, Aleve [naproxen sodium], and Other    Review of Systems   Review of Systems  Genitourinary:  Positive for dysuria and hematuria.  All other systems reviewed and are negative.   Physical Exam Updated Vital Signs BP 134/85 (BP Location: Right Arm)   Pulse 66   Temp 97.6 F (36.4 C) (Oral)    Resp 17   SpO2 95%  Physical Exam Vitals and nursing note reviewed.  Constitutional:      General: She is not in acute distress.    Appearance: She is well-developed.  HENT:     Head: Normocephalic and atraumatic.   Eyes:     Conjunctiva/sclera: Conjunctivae normal.    Cardiovascular:     Rate and Rhythm: Normal rate and regular rhythm.     Heart sounds: No murmur heard. Pulmonary:     Effort: Pulmonary effort is normal. No respiratory distress.     Breath sounds: Normal breath sounds.  Abdominal:     Palpations: Abdomen is soft.     Tenderness: There is no abdominal tenderness. There is no right CVA tenderness or left CVA tenderness.   Musculoskeletal:        General: No swelling.     Cervical back: Neck supple.   Skin:    General: Skin is warm and dry.     Capillary Refill: Capillary refill takes less than 2 seconds.   Neurological:     Mental Status: She is alert and oriented to person, place, and time. Mental status is at baseline.   Psychiatric:        Mood and Affect: Mood normal.     ED Results / Procedures / Treatments   Labs (all labs ordered are listed, but only abnormal results are displayed) Labs Reviewed  URINALYSIS, ROUTINE W REFLEX MICROSCOPIC - Abnormal; Notable for the following components:      Result Value   Color, Urine RED (*)    APPearance TURBID (*)    Glucose, UA   (*)    Value: TEST NOT REPORTED DUE TO COLOR INTERFERENCE OF URINE PIGMENT   Hgb urine dipstick   (*)    Value: TEST NOT REPORTED DUE TO COLOR INTERFERENCE OF URINE PIGMENT   Bilirubin Urine   (*)    Value: TEST NOT REPORTED DUE TO COLOR INTERFERENCE OF URINE PIGMENT   Ketones, ur   (*)    Value: TEST NOT REPORTED DUE TO COLOR INTERFERENCE OF URINE PIGMENT   Protein, ur   (*)    Value: TEST NOT REPORTED DUE TO COLOR INTERFERENCE OF URINE PIGMENT   Nitrite   (*)    Value: TEST NOT REPORTED DUE TO COLOR INTERFERENCE OF URINE PIGMENT   Leukocytes,Ua   (*)    Value: TEST NOT  REPORTED DUE TO COLOR INTERFERENCE OF URINE PIGMENT   All other components within normal limits  CBC - Abnormal; Notable for the following components:   WBC 12.7 (*)    Platelets 427 (*)    All other components within normal limits  BASIC METABOLIC PANEL WITH GFR - Abnormal; Notable for the following components:   Sodium 134 (*)    Glucose, Bld 104 (*)    Creatinine, Ser 1.08 (*)    GFR, Estimated 58 (*)    All other components within normal limits  URINALYSIS, MICROSCOPIC (REFLEX)    EKG None  Radiology CT ABDOMEN PELVIS W CONTRAST Result Date: 06/11/2024 CLINICAL DATA:  Status post ureteroscopy and cystoscopy with increasing dysuria and gross hematuria. Blood clot in urine. EXAM: CT ABDOMEN AND PELVIS WITH CONTRAST TECHNIQUE: Multidetector CT imaging of the abdomen and pelvis was performed using the standard protocol following bolus administration of intravenous contrast. RADIATION DOSE REDUCTION: This exam was performed according to the departmental dose-optimization program which includes automated exposure control, adjustment of the mA and/or kV according to patient size and/or use of iterative reconstruction technique. CONTRAST:  OMNIPAQUE  IOHEXOL  300 MG/ML  SOLN COMPARISON:  CT abdomen pelvis 05/27/2024 FINDINGS: Lower chest: No acute abnormality. Hepatobiliary: Hepatic steatosis. Cholelithiasis. No evidence of acute cholecystitis. Unremarkable biliary tree. Pancreas: Fatty atrophy.  No acute abnormality. Spleen: Unremarkable. Adrenals/Urinary Tract: Stable adrenal glands. Interval placement of a right ureteral stent. Similar prominent right extrarenal pelvis. Small amount of gas in the proximal right ureter adjacent to the stent. Urothelial thickening and hyperenhancement of the right ureter. Symmetric nephrograms. Nondistended bladder. Stomach/Bowel: Normal caliber large and small bowel. Colonic diverticulosis without diverticulitis. Moderate stool in the right colon. Normal  appendix. 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 No evidence of ischemia. Vascular/Lymphatic: Aortic atherosclerosis. No enlarged abdominal or pelvic lymph nodes. Reproductive: Fibroid uterus.  No adnexal mass. Other: No free intraperitoneal fluid or air. Musculoskeletal: No acute fracture. IMPRESSION: 1. Interval placement of a right ureteral stent. Similar prominent right extrarenal pelvis. 2. Urothelial thickening and hyperenhancement of the right ureter may be due to infection or inflammation secondary to stent placement. 3. Bladder is not well assessed due to under distension. 4. 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 No evidence of ischemia. 5. Hepatic steatosis. 6. Cholelithiasis without evidence of acute cholecystitis. 7. Aortic Atherosclerosis (ICD10-I70.0). Electronically Signed   By: Rozell Cornet M.D.   On: 06/11/2024 02:38   DG C-Arm 1-60 Min-No Report Result Date: 06/09/2024 Fluoroscopy was utilized by the requesting physician.  No radiographic interpretation.    Procedures Procedures   Medications Ordered in ED Medications  iohexol  (OMNIPAQUE ) 300 MG/ML solution 100 mL (100 mLs Intravenous Contrast Given 06/11/24 0226)    ED Course/ Medical Decision Making/ A&P   Medical Decision Making Amount and/or Complexity of Data Reviewed Labs: ordered. Radiology: ordered.  Risk Prescription drug management.   63 year old female presents for evaluation.  Please see HPI for further details.  On examination the patient is afebrile and nontachycardic.  Her lung sounds are clear bilaterally, she is not hypoxic.  Abdomen is soft and compressible.  No CVA tenderness bilaterally.  Neurological examination at baseline.  Patient overall nontoxic in appearance.  Reassuring vital signs.  No apparent distress on examination.  Patient reports recent instrumentation, cystoscopy and  ureteroscopy.  Patient presentation sounds consistent with discomfort expected after instrumentation such as a cystoscopy.  She denies any blood thinners.  Will collect labs to include CBC, BMP, urinalysis.  Will assess for any kind of post instrumentation urethral injury with CT abdomen pelvis.  Patient CBC with white count elevation of 12.7.  Patient white count has been slowly downtrending since initially presenting for flank pain.  No fever, no tachycardia here.  Metabolic panel with sodium 134, creatinine 1.08, GFR 58, anion gap 8.  Creatinine 2 weeks ago was 1.18.  Urinalysis unable to be properly assessed.   CT abdomen pelvis reveals interval placement of right ureteral stent.  Also shows urothelial thickening and hyperenhancement of the right ureter which may be due to infectious or inflammation secondary to stent placement.  Bladder not well assessed due to underdistention.  Other nonacute observations also noted.  At this time, will place patient on 14 days of Bactrim  which she will take twice a day.  Will have her follow-up with urology for further management and care.  She was advised to return to the ED with any new or worsening symptoms she voiced understanding.  She is stable to discharge home.   Final Clinical Impression(s) / ED Diagnoses Final diagnoses:  Hematuria, unspecified type  Dysuria    Rx / DC Orders ED Discharge Orders          Ordered    cephALEXin  (KEFLEX ) 500 MG capsule  3 times daily,   Status:  Discontinued        06/11/24 0314    sulfamethoxazole -trimethoprim  (BACTRIM  DS) 800-160 MG tablet  2 times daily        06/11/24 0317              Adel Aden, PA-C 06/11/24 0318    Afton Horse T, DO 06/17/24 1502

## 2024-06-11 ENCOUNTER — Encounter (HOSPITAL_COMMUNITY): Payer: Self-pay

## 2024-06-11 ENCOUNTER — Emergency Department (HOSPITAL_COMMUNITY)

## 2024-06-11 DIAGNOSIS — K3189 Other diseases of stomach and duodenum: Secondary | ICD-10-CM | POA: Diagnosis not present

## 2024-06-11 DIAGNOSIS — K8020 Calculus of gallbladder without cholecystitis without obstruction: Secondary | ICD-10-CM | POA: Diagnosis not present

## 2024-06-11 MED ORDER — SULFAMETHOXAZOLE-TRIMETHOPRIM 800-160 MG PO TABS: 1.0000 | ORAL_TABLET | Freq: Two times a day (BID) | ORAL | 0 refills | Status: DC

## 2024-06-11 MED ORDER — CEPHALEXIN 500 MG PO CAPS: 500.0000 mg | ORAL_CAPSULE | Freq: Three times a day (TID) | ORAL | 0 refills | Status: DC

## 2024-06-11 MED ORDER — IOHEXOL 300 MG/ML  SOLN
100.0000 mL | Freq: Once | INTRAMUSCULAR | Status: AC | PRN
  Administered 2024-06-11: 100 mL via INTRAVENOUS

## 2024-06-11 NOTE — Discharge Instructions (Signed)
 It was a pleasure taking part in your care.  As discussed, CT scan shows inflammation to your right ureter.  We are placing on antibiotic called Bactrim.  You will take this twice a day for 14 days.  Please follow-up with your urology team for further management and care.  Return to the ED with any new or worsening symptoms.

## 2024-06-16 DIAGNOSIS — N201 Calculus of ureter: Secondary | ICD-10-CM | POA: Diagnosis not present

## 2024-06-16 DIAGNOSIS — N132 Hydronephrosis with renal and ureteral calculous obstruction: Secondary | ICD-10-CM | POA: Diagnosis not present

## 2024-06-22 ENCOUNTER — Encounter (INDEPENDENT_AMBULATORY_CARE_PROVIDER_SITE_OTHER): Payer: Self-pay | Admitting: Family Medicine

## 2024-06-22 ENCOUNTER — Ambulatory Visit (INDEPENDENT_AMBULATORY_CARE_PROVIDER_SITE_OTHER): Admitting: Family Medicine

## 2024-06-22 VITALS — BP 123/83 | HR 72 | Temp 98.1°F | Ht 63.0 in | Wt 231.0 lb

## 2024-06-22 DIAGNOSIS — E1165 Type 2 diabetes mellitus with hyperglycemia: Secondary | ICD-10-CM

## 2024-06-22 DIAGNOSIS — Z6841 Body Mass Index (BMI) 40.0 and over, adult: Secondary | ICD-10-CM

## 2024-06-22 DIAGNOSIS — Z7985 Long-term (current) use of injectable non-insulin antidiabetic drugs: Secondary | ICD-10-CM

## 2024-06-22 DIAGNOSIS — Z87442 Personal history of urinary calculi: Secondary | ICD-10-CM | POA: Diagnosis not present

## 2024-06-22 NOTE — Assessment & Plan Note (Signed)
 Patient stopped ozempic  a week before she needed her surgery- she has now been off ozempic  for 2-3 weeks. She is ready to start ozempic  again.  Patient to restart ozempic  at 0.25mg  then increase to 0.5mg  after two weeks
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
## 2024-07-01 DIAGNOSIS — Z87442 Personal history of urinary calculi: Secondary | ICD-10-CM | POA: Insufficient documentation

## 2024-07-01 NOTE — Assessment & Plan Note (Signed)
 Recent history of kidney stone, stent placement and cystoscopy and lithotripsy.  She is finally now feeling better.  She wants to decrease animal protein and increase vegetable protein due to this.  We discussed various vegetable protein options for her to ensure nutrition with dietary changes.

## 2024-07-01 NOTE — Assessment & Plan Note (Signed)
 Anthropometric Measurements Height: 5' 3 (1.6 m) Weight: 231 lb (104.8 kg) BMI (Calculated): 40.93 Weight at Last Visit: 235 lb Weight Lost Since Last Visit: 4 lb Weight Gained Since Last Visit: 0 Starting Weight: 249 lb Total Weight Loss (lbs): 18 lb (8.165 kg) Peak Weight: 335 lb Body Composition  Body Fat %: 37.4 % Fat Mass (lbs): 86.6 lbs Muscle Mass (lbs): 137.8 lbs Total Body Water (lbs): 90.4 lbs Visceral Fat Rating : 13 Other Clinical Data Fasting: no Labs: no Today's Visit #: 45 Starting Date: 09/12/20

## 2024-07-13 NOTE — Patient Instructions (Signed)
 Hypercalcemia Hypercalcemia is when the level of calcium in a person's blood is above normal. The body needs calcium to make bones and keep them strong. Calcium also helps the muscles, nerves, brain, and heart work the way they should. Most of the calcium in the body is stored in the bones. There is also calcium in the blood. Hypercalcemia occurs when there is too much calcium in your blood. Calcium levels in the blood are regulated by hormones, kidneys, and the gastrointestinal tract.  Hypercalcemia can happen when calcium comes out of the bones, or when the kidneys are not able to remove calcium from the blood. Hypercalcemia can be mild or severe. What are the causes? There are many possible causes of hypercalcemia. Common causes of this condition include: Hyperparathyroidism. This is a condition in which the body produces too much parathyroid hormone. There are four parathyroid glands in your neck. These glands produce a chemical messenger (hormone) that helps the body absorb calcium from foods and helps your bones release calcium. Certain kinds of cancer. Less common causes of hypercalcemia include: Calcium and vitamin D dietary supplements. Chronic kidney disease. Hyperthyroidism. Severe dehydration. Being on bed rest or being inactive for a long time. Certain medicines. Infections. What increases the risk? You are more likely to develop this condition if: You are female. You are 42 years of age or older. You have a family history of hypercalcemia. What are the signs or symptoms? Mild hypercalcemia that starts slowly may not cause symptoms. Severe, sudden hypercalcemia is more likely to cause symptoms, such as: Being more thirsty than usual. Needing to urinate more often than usual. Abdominal pain. Nausea and vomiting. Constipation. Muscle pain, twitching, or weakness. Feeling very tired. How is this diagnosed?  Hypercalcemia is usually diagnosed with a blood test. You may also  have tests to help check what is causing this condition. Tests include imaging tests and more blood tests. How is this treated? Treatment for hypercalcemia depends on the cause. Treatment may include: Receiving fluids through an IV. Medicines. These can be used to: Keep calcium levels steady after receiving fluids (loop diuretics). Keep calcium in your bones (bisphosphonates). Lower the calcium level in your blood. Surgery to remove overactive parathyroid glands. A procedure that filters your blood to correct calcium levels (hemodialysis). Follow these instructions at home:  Take over-the-counter and prescription medicines only as told by your health care provider. Follow instructions from your health care provider about eating or drinking restrictions. Drink enough fluid to keep your urine pale yellow. Stay active. Weight-bearing exercise helps to keep calcium in your bones. Follow instructions from your health care provider about what type and level of exercise is safe for you. Keep all follow-up visits. This is important. Contact a health care provider if: You have a fever. Your heartbeat is irregular or very fast. You have changes in mood, memory, or personality. Get help right away if: You have severe abdominal pain. You have chest pain. You have trouble breathing. You become very confused and sleepy. You lose consciousness. These symptoms may represent a serious problem that is an emergency. Do not wait to see if the symptoms will go away. Get medical help right away. Call your local emergency services (911 in the [place] Do not drive yourself to the hospital. Summary Hypercalcemia is when the level of calcium in a person's blood is above normal. The body needs calcium to make bones and keep them strong. There are many possible causes of hypercalcemia, and treatment depends on  the cause. Take over-the-counter and prescription medicines only as told by your health care  provider. This information is not intended to replace advice given to you by your health care provider. Make sure you discuss any questions you have with your health care provider. Document Revised: 05/24/2021 Document Reviewed: 05/24/2021 Elsevier Patient Education  2024 [employer].

## 2024-07-15 ENCOUNTER — Ambulatory Visit: Admitting: Nurse Practitioner

## 2024-07-15 ENCOUNTER — Encounter: Payer: Self-pay | Admitting: Nurse Practitioner

## 2024-07-15 NOTE — Progress Notes (Signed)
 Endocrinology Consult Note       07/15/2024, 9:34 AM   SUBJECTIVE:  Tracy Graves is a 63 y.o.-year-old female, referred by her  Catharine Ethelene BIRCH., PA-C  , for evaluation for hypercalcemia/hyperparathyroidism.  Past Medical History:  Diagnosis Date   Abdominal hernia    Abscess    chest wall, right breast   Cellulitis    Coronary artery disease    Diabetes (HCC)    History of kidney stones    Hyperglycemia due to type 2 diabetes mellitus (HCC) 06/05/2015   Hypertension    Hypothyroid    2016   Incomplete RBBB    Intertrigo    Morbid obesity due to excess calories (HCC) 06/05/2015   Multiple food allergies    Shortness of breath    Tobacco use     Past Surgical History:  Procedure Laterality Date   CYSTOSCOPY/URETEROSCOPY/HOLMIUM LASER/STENT PLACEMENT Right 06/09/2024   Procedure: CYSTOSCOPY/URETEROSCOPY/HOLMIUM LASER/STENT PLACEMENT;  Surgeon: Devere Lonni Righter, MD;  Location: [facility];  Service: Urology;  Laterality: Right;  CYSTOSCOPY WITH RIGHT URETEROSCOPY, LASER LITHOTRIPSY, STENT PLACEMENT   IR GENERIC HISTORICAL  12/23/2016   IR NEPHROSTOMY PLACEMENT LEFT 12/23/2016 Ozell Specking, MD [facility]   NEPHROLITHOTOMY Left 01/28/2017   Procedure: LEFT NEPHROLITHOTOMY PERCUTANEOUS;  Surgeon: Morene LELON Salines, MD;  Location: [facility];  Service: Urology;  Laterality: Left;   NEPHROSTOMY TUBE PLACEMENT ([facility] HX)  12/23/2016    Social History   Tobacco Use   Smoking status: Some Days    Current packs/day: 0.00    Average packs/day: 0.8 packs/day for 23.0 years (17.3 ttl pk-yrs)    Types: Cigarettes    Start date: 10/22/1996    Last attempt to quit: 10/23/2019    Years since quitting: 4.7   Smokeless tobacco: Never   Tobacco comments:    pt stated smokes every now and then as of 06/09/20  Vaping Use   Vaping status: Never Used  Substance Use Topics   Alcohol use: No   Drug use: No    Family  History  Problem Relation Age of Onset   Breast cancer Mother    Cancer Mother    Obesity Mother    Leukemia Maternal Grandfather    Ovarian cancer Maternal Aunt    Heart attack Father    Diabetes Mellitus II Father    Diabetes Father    Thyroid  disease Father    Depression Father    Obesity Father     Outpatient Encounter Medications as of 07/15/2024  Medication Sig   atorvastatin  (LIPITOR) 20 MG tablet Take 1 tablet (20 mg total) by mouth daily.   Insulin  Pen Needle (BD PEN NEEDLE NANO 2ND GEN) 32G X 4 MM MISC 1 Package by Does not apply route 2 (two) times daily.   levocetirizine (XYZAL) 5 MG tablet Take 5 mg by mouth daily as needed for allergies.   levothyroxine  (SYNTHROID ) 125 MCG tablet Take 125 mcg by mouth daily before breakfast.    metFORMIN  (GLUCOPHAGE ) 500 MG tablet Take 500 mg by mouth daily with breakfast.    metoprolol succinate (TOPROL-XL) 50 MG 24 hr tablet Take 50 mg by mouth  daily.   nystatin  (MYCOSTATIN /NYSTOP ) powder Apply 1 Application topically daily as needed (irritation).   nystatin  cream (MYCOSTATIN ) Apply 1 Application topically daily as needed (irritation).   polyethylene glycol (MIRALAX  / GLYCOLAX ) 17 g packet Take 17 g by mouth daily as needed for moderate constipation.   Semaglutide ,0.25 or 0.5MG /DOS, (OZEMPIC , 0.25 OR 0.5 MG/DOSE,) 2 MG/3ML SOPN Inject 0.5 mg into the skin once a week. (Patient taking differently: Inject 0.25 mg into the skin once a week.)   Vitamin D , Ergocalciferol , (DRISDOL ) 1.25 MG (50000 UNIT) CAPS capsule Take 1 capsule (50,000 Units total) by mouth every 7 (seven) days. (Patient not taking: Reported on 07/15/2024)   No facility-administered encounter medications on file as of 07/15/2024.    Allergies  Allergen Reactions   Fish Allergy Diarrhea, Hives and Nausea And Vomiting   Aleve [Naproxen Sodium] Rash   Other Rash    all nuts     HPI  Doylene Splinter [name] was diagnosed with hypercalcemia, first incidence noted in 2017.   Patient has no previously known history of parathyroid, pituitary, adrenal dysfunctions; no family history of such dysfunctions.  -Review of her referral package of most recent labs reveals calcium  of 9.9, improving, no recent corresponding PTH drawn at that time.  Previously, PTH was elevated back in 2022 at 89.  I reviewed pt's DEXA scans: never had Dexa scan in the past.  No prior history of fragility fractures or falls. She does have history of kidney stones.  No history of CKD. Last BUN/Cr: 17/1.08  she is not on HCTZ or other thiazide therapy.  She does have history of vitamin D  deficiency. Last vitamin D  level was 54.2 on 03/09/24.  She last took her Vitamin D  supplement over a month and a half ago.  she is not on calcium  supplements,  she eats dairy and green, leafy, vegetables on average amounts.  she does not have a family history of hypercalcemia, pituitary tumors, thyroid  cancer, or osteoporosis.   I reviewed her chart and she also has a history of CAD, HTN, OSA, DM, HLD, hypothyroidism, vitamin D  deficiency, tobacco use.    Review of systems  Constitutional: + Minimally fluctuating body weight,  current Body mass index is 42.05 kg/m. , no fatigue, no subjective hyperthermia, no subjective hypothermia Eyes: no blurry vision, no xerophthalmia ENT: no sore throat, no nodules palpated in throat, no dysphagia/odynophagia, no hoarseness Cardiovascular: no chest pain, no shortness of breath, no palpitations, no leg swelling Respiratory: + productive cough- since she had surgery recently- thinks allergy related?, no shortness of breath Gastrointestinal: no nausea/vomiting/diarrhea Musculoskeletal: no muscle/joint aches Skin: no rashes, no hyperemia Neurological: no tremors, no numbness, no tingling, no dizziness Psychiatric: no depression, no  anxiety ------------------------------------------------------------------------------------------------------------------------------------------ OBJECTIVE: BP 110/76 (BP Location: Right Arm, Patient Position: Sitting)   Ht 5' 3 (1.6 m)   Wt 237 lb 6.4 oz (107.7 kg)   BMI 42.05 kg/m , Body mass index is 42.05 kg/m. Wt Readings from Last 3 Encounters:  07/15/24 237 lb 6.4 oz (107.7 kg)  06/22/24 231 lb (104.8 kg)  06/09/24 236 lb (107 kg)     BP Readings from Last 3 Encounters:  07/15/24 110/76  06/22/24 123/83  06/11/24 (!) 141/92     Physical Exam- Limited  Constitutional:  Body mass index is 42.05 kg/m. , not in acute distress, normal state of mind Eyes:  EOMI, no exophthalmos Neck: Supple Thyroid : No gross goiter, no palpable nodularity Cardiovascular: RRR, no murmurs, rubs, or gallops, no edema  Respiratory: Adequate breathing efforts, no crackles, rales, rhonchi, or wheezing Musculoskeletal: no gross deformities, strength intact in all four extremities, no gross restriction of joint movements Skin:  no rashes, no hyperemia Neurological: no tremor with outstretched hands   CMP ( most recent) CMP     Component Value Date/Time   NA 134 (L) 06/10/2024 2325   NA 138 03/09/2024 0902   K 4.0 06/10/2024 2325   CL 103 06/10/2024 2325   CO2 23 06/10/2024 2325   GLUCOSE 104 (H) 06/10/2024 2325   BUN 17 06/10/2024 2325   BUN 15 03/09/2024 0902   CREATININE 1.08 (H) 06/10/2024 2325   CALCIUM  9.9 06/10/2024 2325   PROT 6.9 03/09/2024 0902   ALBUMIN 4.3 03/09/2024 0902   AST 16 03/09/2024 0902   ALT 15 03/09/2024 0902   ALKPHOS 147 (H) 03/09/2024 0902   BILITOT 0.4 03/09/2024 0902   EGFR 69 03/09/2024 0902   GFRNONAA 58 (L) 06/10/2024 2325     Diabetic Labs (most recent): Lab Results  Component Value Date   HGBA1C 5.2 06/05/2024   HGBA1C 5.6 03/09/2024   HGBA1C 5.7 (H) 01/21/2023     Lipid Panel ( most recent) Lipid Panel     Component Value Date/Time    CHOL 116 03/09/2024 0902   TRIG 105 03/09/2024 0902   HDL 56 03/09/2024 0902   CHOLHDL 2.2 01/04/2023 0847   LDLCALC 41 03/09/2024 0902   LABVLDL 19 03/09/2024 0902      Lab Results  Component Value Date   TSH 0.520 03/09/2024   TSH 1.390 09/11/2022   TSH 1.750 03/22/2022   TSH 1.28 11/20/2021   TSH 0.235 (L) 07/31/2021   TSH 1.160 09/12/2020   TSH 38.102 (H) 06/05/2015   FREET4 0.53 (L) 06/07/2015     ------------------------------------------------------------------------------------------------------------------------------- Assessment/Plan: 1. Hypercalcemia / Hyperparathyroidism  Patient has had several instances of elevated calcium , with the highest level being at 11.3 mg/dL. A corresponding intact PTH level was also high, at 89.  - Patient also has vitamin D  deficiency, with the last level being 54.2- on supplementation, last dose over a month and a half ago.  - No apparent complications from hypercalcemia/hyperparathyroidism other than kidney stones: no history of osteoporosis, fragility fractures, abdominal pain, major mood disorders, bone pain.  - I discussed with the patient about the physiology of calcium  and parathyroid hormone, and possible  effects of  increased PTH/ Calcium  , including kidney stones, cardiac dysrhythmias, osteoporosis, abdominal pain, etc.   - The work up so far is not sufficient to reach a conclusion for definitive therapy.  she needs more studies to confirm and classify the parathyroid dysfunction she may have. I will proceed to obtain  repeat intact PTH/calcium , serum magnesium, serum phosphorus.  It is also essential to obtain 24-hour urine calcium /creatinine to rule out the rare but important cause of mild elevation in calcium  and PTH- FHH ( Familial Hypocalciuric Hypercalcemia), which may not require any active intervention.  - I will request for her next DEXA scan to include the distal  33% of  radius for evaluation of cortical bone, which is  predominantly affected by hyperparathyroidism.     - Time spent with the patient: 60 minutes, which was spent in obtaining information about her symptoms, reviewing her previous labs, evaluations, and treatments, counseling her about her hypercalcemia, and developing a plan to confirm the diagnosis and long term treatment as necessary.  Please refer to  Patient Self Inventory in the Media  tab for reviewed  elements of pertinent patient history.  Tayler Heiden [name] participated in the discussions, expressed understanding, and voiced agreement with the above plans.  All questions were answered to her satisfaction. she is encouraged to contact clinic should she have any questions or concerns prior to her return visit.  Follow Up Plan: - Return will call with results of labs and dexa scan and next steps.   Benton Rio, [facility] [address] [place], [place] [address] Phone: [phone] Fax: [phone]  07/15/2024, 9:34 AM

## 2024-07-18 LAB — CREATININE, URINE, 24 HOUR
Creatinine, 24H Ur: 1011 mg/(24.h) (ref 800–1800)
Creatinine, Urine: 112.3 mg/dL

## 2024-07-18 LAB — CALCIUM, URINE, 24 HOUR
Calcium, 24H Urine: 222 mg/(24.h) (ref 0–320)
Calcium, Urine: 24.7 mg/dL

## 2024-07-20 ENCOUNTER — Ambulatory Visit: Payer: Self-pay | Admitting: Nurse Practitioner

## 2024-07-20 DIAGNOSIS — E213 Hyperparathyroidism, unspecified: Secondary | ICD-10-CM

## 2024-07-20 DIAGNOSIS — M818 Other osteoporosis without current pathological fracture: Secondary | ICD-10-CM

## 2024-07-20 NOTE — Progress Notes (Signed)
 Noted, that results/recommendations sent to the patient through MyChart.

## 2024-07-20 NOTE — Progress Notes (Signed)
 FYI I sent mychart message going over recent results.

## 2024-07-26 LAB — COMPREHENSIVE METABOLIC PANEL WITH GFR
ALT: 12 IU/L (ref 0–32)
AST: 16 IU/L (ref 0–40)
Albumin: 4.1 g/dL (ref 3.9–4.9)
Alkaline Phosphatase: 116 IU/L (ref 44–121)
BUN/Creatinine Ratio: 18 (ref 12–28)
BUN: 16 mg/dL (ref 8–27)
Bilirubin Total: 0.4 mg/dL (ref 0.0–1.2)
CO2: 17 mmol/L — ABNORMAL LOW (ref 20–29)
Calcium: 10.4 mg/dL — ABNORMAL HIGH (ref 8.7–10.3)
Chloride: 103 mmol/L (ref 96–106)
Creatinine, Ser: 0.91 mg/dL (ref 0.57–1.00)
Globulin, Total: 2.6 g/dL (ref 1.5–4.5)
Glucose: 95 mg/dL (ref 70–99)
Potassium: 4.8 mmol/L (ref 3.5–5.2)
Sodium: 135 mmol/L (ref 134–144)
Total Protein: 6.7 g/dL (ref 6.0–8.5)
eGFR: 71 mL/min/1.73 (ref >59)

## 2024-07-26 LAB — PTH, INTACT AND CALCIUM: PTH: 85 pg/mL — ABNORMAL HIGH (ref 15–65)

## 2024-07-26 LAB — MAGNESIUM: Magnesium: 1.9 mg/dL (ref 1.6–2.3)

## 2024-07-26 LAB — PHOSPHORUS: Phosphorus: 2.8 mg/dL — ABNORMAL LOW (ref 3.0–4.3)

## 2024-07-26 LAB — PTH-RELATED PEPTIDE: PTH-related peptide: <2 pmol/L

## 2024-07-26 LAB — VITAMIN D 25 HYDROXY (VIT D DEFICIENCY, FRACTURES): Vit D, 25-Hydroxy: 33.6 ng/mL (ref 30.0–100.0)

## 2024-07-27 ENCOUNTER — Encounter (INDEPENDENT_AMBULATORY_CARE_PROVIDER_SITE_OTHER): Payer: Self-pay | Admitting: Family Medicine

## 2024-07-27 ENCOUNTER — Ambulatory Visit (HOSPITAL_COMMUNITY)
Admission: RE | Admit: 2024-07-27 | Discharge: 2024-07-27 | Disposition: A | Discharge status: Home / Self Care | Source: Ambulatory Visit | Attending: Nurse Practitioner | Admitting: Nurse Practitioner

## 2024-07-27 ENCOUNTER — Ambulatory Visit (INDEPENDENT_AMBULATORY_CARE_PROVIDER_SITE_OTHER): Admitting: Family Medicine

## 2024-07-27 VITALS — BP 114/77 | HR 87 | Temp 98.2°F | Ht 63.0 in | Wt 235.0 lb

## 2024-07-27 DIAGNOSIS — Z6841 Body Mass Index (BMI) 40.0 and over, adult: Secondary | ICD-10-CM | POA: Diagnosis not present

## 2024-07-27 DIAGNOSIS — Z780 Asymptomatic menopausal state: Secondary | ICD-10-CM | POA: Diagnosis not present

## 2024-07-27 DIAGNOSIS — E1165 Type 2 diabetes mellitus with hyperglycemia: Secondary | ICD-10-CM

## 2024-07-27 DIAGNOSIS — Z7985 Long-term (current) use of injectable non-insulin antidiabetic drugs: Secondary | ICD-10-CM | POA: Diagnosis not present

## 2024-07-27 DIAGNOSIS — R4589 Other symptoms and signs involving emotional state: Secondary | ICD-10-CM

## 2024-07-27 DIAGNOSIS — M810 Age-related osteoporosis without current pathological fracture: Secondary | ICD-10-CM | POA: Diagnosis not present

## 2024-07-27 MED ORDER — SERTRALINE HCL 25 MG PO TABS: 25.0000 mg | ORAL_TABLET | Freq: Every day | ORAL | 0 refills | Status: DC

## 2024-07-27 MED ORDER — OZEMPIC (0.25 OR 0.5 MG/DOSE) 2 MG/3ML ~~LOC~~ SOPN: 0.5000 mg | PEN_INJECTOR | SUBCUTANEOUS | 0 refills | Status: DC
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
## 2024-07-28 DIAGNOSIS — N132 Hydronephrosis with renal and ureteral calculous obstruction: Secondary | ICD-10-CM | POA: Diagnosis not present

## 2024-07-28 DIAGNOSIS — R1084 Generalized abdominal pain: Secondary | ICD-10-CM | POA: Diagnosis not present

## 2024-07-28 DIAGNOSIS — N201 Calculus of ureter: Secondary | ICD-10-CM | POA: Diagnosis not present

## 2024-07-28 NOTE — Progress Notes (Signed)
 FYI I sent mychart message going over bone density exam results.  Need to send her to Dr. Eletha for parathyroidectomy, will do this after she responds to message.

## 2024-07-29 NOTE — Assessment & Plan Note (Signed)
 On ozempic  with some control of food intake. Needs a refill of ozempic  today and will discuss med management at next appointment- patient encouraged to continue her current meal plan.

## 2024-08-11 ENCOUNTER — Other Ambulatory Visit (HOSPITAL_COMMUNITY): Payer: Self-pay | Admitting: Surgery

## 2024-08-11 DIAGNOSIS — E210 Primary hyperparathyroidism: Secondary | ICD-10-CM | POA: Diagnosis not present

## 2024-08-11 NOTE — Progress Notes (Signed)
 REFERRING PHYSICIAN:  Therisa Benton PARAS, NP  PROVIDER:  KRYSTAL OZELL SPINNER, MD  MRN: [id number] DOB: Aug 14, 1961 DATE OF ENCOUNTER: 08/11/2024  Subjective   Chief Complaint: New Consultation (Primary hyperparathyroidism)     History of Present Illness:  Patient is referred by Benton Therisa, NP, for surgical evaluation and management of newly diagnosed primary hyperparathyroidism.  Patient was noted on routine laboratory studies to have persistently elevated serum calcium  levels.  Levels have ranged from 9.9 to as high as 11.1.  Subsequent PTH level was elevated at 89.  Patient has multiple symptoms including fatigue, recurrent nephrolithiasis, urinary frequency, constipation, and bone loss on bone density scanning.  Patient has had no prior head or neck surgery.  There is a family history of thyroid  disease in her sister and her father but no family history of parathyroid  disease.  Patient has not had any imaging studies performed.  She presents today to discuss parathyroid  disease, further evaluation, and surgical management.   Review of Systems: A complete review of systems was obtained from the patient.  I have reviewed this information and discussed as appropriate with the patient.  See HPI as well for other ROS.  Review of Systems  Constitutional:  Positive for malaise/fatigue.  HENT: Negative.    Eyes: Negative.   Respiratory: Negative.    Cardiovascular: Negative.   Gastrointestinal:  Positive for constipation.  Genitourinary:  Positive for frequency.  Musculoskeletal: Negative.   Skin: Negative.   Neurological: Negative.   Endo/Heme/Allergies: Negative.   Psychiatric/Behavioral: Negative.        Medical History: Past Medical History:  Diagnosis Date  . Diabetes mellitus without complication (CMS/HHS-HCC)   . Hyperlipidemia   . Hypertension   . Thyroid  disease     Patient Active Problem List  Diagnosis  . Primary hyperparathyroidism (CMS/HHS-HCC)     Past Surgical History:  Procedure Laterality Date  . Left Nephrolithotomy Percutaneous  01/28/2017   Dr. Cam     No Known Allergies  Current Outpatient Medications on File Prior to Visit  Medication Sig Dispense Refill  . albuterol 90 mcg/actuation inhaler INHALE 2 PUFFS BY MOUTH EVERY 4 TO 6 HOURS AS NEEDED WHEEZING AND/OR SHORTNESS OF BREATH    . atorvastatin  (LIPITOR) 10 MG tablet Take 1 tablet by mouth once daily    . ergocalciferol , vitamin D2, 1,250 mcg (50,000 unit) capsule Take 50,000 Units by mouth every 7 (seven) days    . levothyroxine  (SYNTHROID ) 125 MCG tablet Take by mouth    . metFORMIN  (GLUCOPHAGE ) 500 MG tablet Take 500 mg by mouth daily with breakfast    . sertraline  (ZOLOFT ) 25 MG tablet Take 25 mg by mouth once daily     No current facility-administered medications on file prior to visit.    Family History  Problem Relation Age of Onset  . Skin cancer Mother   . Breast cancer Mother   . Obesity Father   . High blood pressure (Hypertension) Father   . Coronary Artery Disease (Blocked arteries around heart) Father   . Diabetes Father      Social History   Tobacco Use  Smoking Status Some Days  . Current packs/day: 0.50  . Types: Cigarettes  Smokeless Tobacco Never     Social History   Socioeconomic History  . Marital status: Widowed  Tobacco Use  . Smoking status: Some Days    Current packs/day: 0.50    Types: Cigarettes  . Smokeless tobacco: Never  Substance and Sexual Activity  .  Alcohol use: Not Currently  . Drug use: Never    Objective:    Vitals:   08/11/24 1325  PainSc: 0-No pain    There is no height or weight on file to calculate BMI.  Physical Exam   GENERAL APPEARANCE Comfortable, no acute issues Development: normal Gross deformities: none  SKIN Rash, lesions, ulcers: none Induration, erythema: none Nodules: none palpable  EYES Conjunctiva and lids: normal Pupils: equal  EARS, NOSE, MOUTH,  THROAT External ears: no lesion or deformity External nose: no lesion or deformity Hearing: grossly normal  NECK Symmetric: yes Trachea: midline Thyroid : no palpable nodules in the thyroid  bed  CHEST/CV Not assessed  ABDOMEN Not assessed  GENITOURINARY/RECTAL Not assessed  MUSCULOSKELETAL Station and gait: normal Digits and nails: no clubbing or cyanosis Muscle strength: grossly normal all extremities Deformity: none  LYMPHATIC Cervical: none palpable Supraclavicular: none palpable  PSYCHIATRIC Oriented to person, place, and time: yes Mood and affect: normal for situation Judgment and insight: appropriate for situation    Assessment and Plan:  Diagnoses and all orders for this visit:  Primary hyperparathyroidism (CMS/HHS-HCC)    Patient is referred by her endocrinologist, Benton Rio, NP, for surgical evaluation and management of newly diagnosed primary hyperparathyroidism.  Patient provided with a copy of Parathyroid  Surgery: Treatment for Your Parathyroid  Gland Problem, published by [employer], 12 pages.  Book reviewed and explained to patient during visit today.  Today we reviewed her clinical history.  We reviewed recent laboratory studies.  Would like to proceed with imaging studies to include an ultrasound examination of the neck as well as a nuclear medicine parathyroid  scan with sestamibi.  We discussed the studies.  They are successful and confirming the diagnosis and localizing the adenoma approximately 80% of the time.  If they fail to identify the adenoma, then we will obtain a 4D CT scan of the neck.  Hopefully we can confirm the diagnosis and identify the location of the adenoma prior to surgery.  We discussed the fact that 95% of the time this is single gland disease.  We discussed minimally invasive parathyroid  surgery.  We discussed the size and location of the surgical incision.  We discussed risk and benefits of surgery including the risk of  recurrent laryngeal nerve injury.  We discussed doing this as an outpatient surgical procedure.  The patient understands and wishes to proceed with imaging.  We will make arrangements for ultrasound and sestamibi scans in the near future.  I will contact the patient with the results when they are available and we will make plans for further management at that time.  Krystal Spinner MD [facility] Office: [phone]

## 2024-08-17 ENCOUNTER — Encounter (HOSPITAL_COMMUNITY): Payer: Self-pay

## 2024-08-17 ENCOUNTER — Ambulatory Visit (HOSPITAL_COMMUNITY)
Admission: RE | Admit: 2024-08-17 | Discharge: 2024-08-17 | Disposition: A | Discharge status: Home / Self Care | Source: Ambulatory Visit | Attending: Surgery | Admitting: Surgery

## 2024-08-17 ENCOUNTER — Ambulatory Visit (HOSPITAL_COMMUNITY)
Admission: RE | Admit: 2024-08-17 | Discharge: 2024-08-17 | Discharge status: Home / Self Care | Source: Ambulatory Visit | Attending: Surgery | Admitting: Surgery

## 2024-08-17 DIAGNOSIS — E210 Primary hyperparathyroidism: Secondary | ICD-10-CM | POA: Insufficient documentation

## 2024-08-17 DIAGNOSIS — D351 Benign neoplasm of parathyroid gland: Secondary | ICD-10-CM | POA: Diagnosis not present

## 2024-08-17 DIAGNOSIS — E213 Hyperparathyroidism, unspecified: Secondary | ICD-10-CM | POA: Diagnosis not present

## 2024-08-17 MED ORDER — TECHNETIUM TC 99M SESTAMIBI - CARDIOLITE
25.1000 | Freq: Once | INTRAVENOUS | Status: AC
  Administered 2024-08-17: 25.1 via INTRAVENOUS

## 2024-08-19 ENCOUNTER — Ambulatory Visit: Payer: Self-pay | Admitting: Surgery

## 2024-08-19 NOTE — Progress Notes (Signed)
 USN and sestamibi scan are both negative for parathyroid  adenoma.  Will proced with 4D-CT scan as discussed in the office.  Burnard - please schedule patient for 4D-CT scan of the neck with parathyroid  protocol.  I will contact the patient with the results when they are available.  Krystal Spinner, MD [facility] A [facility] practice Office: [phone]

## 2024-08-20 ENCOUNTER — Other Ambulatory Visit: Payer: Self-pay | Admitting: Surgery

## 2024-08-20 DIAGNOSIS — E213 Hyperparathyroidism, unspecified: Secondary | ICD-10-CM

## 2024-08-22 ENCOUNTER — Other Ambulatory Visit (INDEPENDENT_AMBULATORY_CARE_PROVIDER_SITE_OTHER): Payer: Self-pay | Admitting: Family Medicine

## 2024-08-22 DIAGNOSIS — E1165 Type 2 diabetes mellitus with hyperglycemia: Secondary | ICD-10-CM

## 2024-08-23 ENCOUNTER — Other Ambulatory Visit (INDEPENDENT_AMBULATORY_CARE_PROVIDER_SITE_OTHER): Payer: Self-pay | Admitting: Family Medicine

## 2024-08-23 DIAGNOSIS — R4589 Other symptoms and signs involving emotional state: Secondary | ICD-10-CM

## 2024-08-24 ENCOUNTER — Ambulatory Visit
Admission: RE | Admit: 2024-08-24 | Discharge: 2024-08-24 | Disposition: A | Discharge status: Home / Self Care | Source: Ambulatory Visit | Attending: Surgery | Admitting: Surgery

## 2024-08-24 DIAGNOSIS — E213 Hyperparathyroidism, unspecified: Secondary | ICD-10-CM

## 2024-08-24 MED ORDER — IOPAMIDOL (ISOVUE-300) INJECTION 61%
75.0000 mL | Freq: Once | INTRAVENOUS | Status: AC | PRN
  Administered 2024-08-24: 75 mL via INTRAVENOUS

## 2024-08-25 ENCOUNTER — Ambulatory Visit (INDEPENDENT_AMBULATORY_CARE_PROVIDER_SITE_OTHER): Admitting: Family Medicine

## 2024-08-25 ENCOUNTER — Encounter (INDEPENDENT_AMBULATORY_CARE_PROVIDER_SITE_OTHER): Payer: Self-pay | Admitting: Family Medicine

## 2024-08-25 VITALS — BP 103/68 | HR 73 | Temp 98.1°F | Ht 63.0 in | Wt 233.0 lb

## 2024-08-25 DIAGNOSIS — Z7985 Long-term (current) use of injectable non-insulin antidiabetic drugs: Secondary | ICD-10-CM

## 2024-08-25 DIAGNOSIS — R4589 Other symptoms and signs involving emotional state: Secondary | ICD-10-CM | POA: Diagnosis not present

## 2024-08-25 DIAGNOSIS — Z6841 Body Mass Index (BMI) 40.0 and over, adult: Secondary | ICD-10-CM | POA: Diagnosis not present

## 2024-08-25 DIAGNOSIS — E1165 Type 2 diabetes mellitus with hyperglycemia: Secondary | ICD-10-CM

## 2024-08-25 MED ORDER — SERTRALINE HCL 25 MG PO TABS: 25.0000 mg | ORAL_TABLET | Freq: Every day | ORAL | 0 refills | Status: DC

## 2024-08-25 MED ORDER — OZEMPIC (0.25 OR 0.5 MG/DOSE) 2 MG/3ML ~~LOC~~ SOPN: 0.5000 mg | PEN_INJECTOR | SUBCUTANEOUS | 0 refills | Status: DC

## 2024-08-25 NOTE — Progress Notes (Signed)
 SUBJECTIVE:  Chief Complaint: Obesity  Interim History: Patient has been undergoing a workup for hypercalcemia with a thorough investigation of her parathyroid .  She went back to Category 2 since last appointment.  She wanted to have one thing where she was structured and could be consistent and know what the day options were.  She felt that was easy to do.  She was able to get all the food in.  The next few weeks she is unsure what she wants to do.   Tracy Graves is here to discuss her progress with her obesity treatment plan. She is on the Category 2 Plan and states she is following her eating plan approximately 95 % of the time. She states she is walking and exercising 60 minutes 5 times per week.   OBJECTIVE: Visit Diagnoses: Problem List Items Addressed This Visit   None   Vitals Temp: 98.1 F (36.7 C) BP: 103/68 Pulse Rate: 73 SpO2: 96 %   Anthropometric Measurements Height: 5' 3 (1.6 m) Weight: 233 lb (105.7 kg) BMI (Calculated): 41.28 Weight at Last Visit: 235 lb Weight Lost Since Last Visit: 2 Weight Gained Since Last Visit: 0 Starting Weight: 249 lb Total Weight Loss (lbs): 16 lb (7.258 kg)   Body Composition  Body Fat %: 43 % Fat Mass (lbs): 100.6 lbs Muscle Mass (lbs): 126.4 lbs Total Body Water (lbs): 84.2 lbs Visceral Fat Rating : 14   Other Clinical Data Today's Visit #: 65 Starting Date: 09/12/20 Comments: Cat 2     ASSESSMENT AND PLAN: Assessment & Plan Type 2 diabetes mellitus with hyperglycemia, without long-term current use of insulin  (HCC) Patient does report some emotional eating given her work up that is pending for possible parathyroidectomy.  She has tolerated 0.5 mg of Ozempic  and mentions she does not feel as though she needs to go up in dosage at this time.  Last parathyroid  test imaging is pending from yesterday.  Will follow-up at next appointment to discuss if patient has still been emotionally eating.  Refill sent to pharmacy Anxiety  about health Unclear at this time whether or not patient will have to undergo surgery for parathyroidectomy.  Last imaging test from yesterday is pending.  Patient reports concerns about long-term side effects of the surgery as well as risks of morbidity during the surgery due to location of parathyroid  gland.  Will follow-up with patient at next appointment concerning future treatment for elevated calcium  level. Morbid obesity (HCC)  BMI 40.0-44.9, adult (HCC)    Diet: Tracy Graves is currently in the action stage of change. As such, her goal is to continue with weight loss efforts and has agreed to the Category 2 Plan and keeping a food journal and adhering to recommended goals of 1200-1300 calories and 90 or more grams of protein.   Exercise:  For substantial health benefits, adults should do at least 150 minutes (2 hours and 30 minutes) a week of moderate-intensity, or 75 minutes (1 hour and 15 minutes) a week of vigorous-intensity aerobic physical activity, or an equivalent combination of moderate- and vigorous-intensity aerobic activity. Aerobic activity should be performed in episodes of at least 10 minutes, and preferably, it should be spread throughout the week.  Behavior Modification:  We discussed the following Behavioral Modification Strategies today: increasing lean protein intake, decreasing simple carbohydrates, increasing vegetables, meal planning and cooking strategies, and planning for success.   No follow-ups on file.   She was informed of the importance of frequent follow up visits to  maximize her success with intensive lifestyle modifications for her multiple health conditions.  Attestation Statements:   Reviewed by clinician on day of visit: allergies, medications, problem list, medical history, surgical history, family history, social history, and previous encounter notes.     Tracy Cho, MD

## 2024-08-30 ENCOUNTER — Telehealth: Admitting: Family

## 2024-08-30 DIAGNOSIS — K0889 Other specified disorders of teeth and supporting structures: Secondary | ICD-10-CM | POA: Diagnosis not present

## 2024-08-30 DIAGNOSIS — K047 Periapical abscess without sinus: Secondary | ICD-10-CM | POA: Diagnosis not present

## 2024-08-30 MED ORDER — AMOXICILLIN-POT CLAVULANATE 875-125 MG PO TABS: 1.0000 | ORAL_TABLET | Freq: Two times a day (BID) | ORAL | 0 refills | Status: DC

## 2024-08-30 NOTE — Progress Notes (Signed)
Approximately 5 minutes was spent documenting and reviewing patient's chart.

## 2024-08-30 NOTE — Progress Notes (Signed)

## 2024-08-31 NOTE — Assessment & Plan Note (Signed)
 Patient does report some emotional eating given her work up that is pending for possible parathyroidectomy.  She has tolerated 0.5 mg of Ozempic  and mentions she does not feel as though she needs to go up in dosage at this time.  Last parathyroid  test imaging is pending from yesterday.  Will follow-up at next appointment to discuss if patient has still been emotionally eating.  Refill sent to pharmacy

## 2024-09-09 ENCOUNTER — Ambulatory Visit: Payer: Self-pay | Admitting: Surgery

## 2024-09-09 NOTE — Progress Notes (Signed)
 Telephone call to patient with results of 4D CT scan of the neck.  This study indicates the possibility of bilateral parathyroid  adenoma.  There appears to be a candidate lesion at the left inferior position and a second lesion at the right superior position.  We discussed proceeding with parathyroidectomy.  We would plan neck exploration with parathyroidectomy.  There may be 1 or 2 parathyroid  adenoma which need to be removed.  This may still be performed as an outpatient procedure.  I will enter orders and submit to our schedulers to contact the patient and arrange a date for surgery at a time convenient for the patient in the near future.  Patient is in agreement with this plan.  Krystal Spinner, MD [facility] Surgery Office: [phone]

## 2024-09-10 ENCOUNTER — Other Ambulatory Visit: Payer: Self-pay

## 2024-09-10 ENCOUNTER — Encounter (HOSPITAL_COMMUNITY): Payer: Self-pay | Admitting: Surgery

## 2024-09-10 NOTE — Progress Notes (Signed)
 For Anesthesia: PCP -   Rochelle D. Muse, PA-C   Cardiologist - Stanly DELENA Leavens, MD  Endocrinology- Benton Rio, NP  Bowel Prep reminder:na  Chest x-ray - na EKG - 01/10/24 in epic Stress Test - 09/28/20 epic ECHO - 10/04/20 epic Cardiac Cath - na Pacemaker/ICD device last checked:na Pacemaker orders received:na Device Rep notified:na  Spinal Cord Stimulator:na  Sleep Study - na, no diagnosis CPAP - na  Fasting Blood Sugar - 97-105 Checks Blood Sugar __3___ times a day Date and result of last Hgb A1c- will obtain  Last dose of GLP1 agonist- ozempic  08/21/24 GLP1 instructions: Hold 7 days prior to schedule (Hold 24 hours-daily)   Last dose of SGLT-2 inhibitors- na SGLT-2 instructions: Hold 72 hours prior to surgery  Blood Thinner Instructions:na Aspirin Instructions:na Last Dose:na  Activity level: Can go up a flight of stairs and activities of daily living without stopping and without chest pain and/or shortness of breath- Inclines cause pt to get winded, or using stairs        Anesthesia review: CAD  Patient denies shortness of breath, fever, cough and chest pain at PAT appointment-yes   Patient verbalized understanding of instructions that were reviewed over the telephone.

## 2024-09-11 ENCOUNTER — Encounter (HOSPITAL_COMMUNITY): Payer: Self-pay | Admitting: Surgery

## 2024-09-11 DIAGNOSIS — E210 Primary hyperparathyroidism: Secondary | ICD-10-CM | POA: Diagnosis present

## 2024-09-11 NOTE — Anesthesia Preprocedure Evaluation (Addendum)
 Anesthesia Evaluation  Patient identified by MRN, date of birth, ID band Patient awake    Reviewed: Allergy & Precautions, NPO status , Patient's Chart, lab work & pertinent test results  Airway Mallampati: III  TM Distance: >3 FB Neck ROM: Full    Dental  (+) Missing, Dental Advisory Given, Loose,    Pulmonary sleep apnea (has never had sleep study, snores at night) , Current Smoker and Patient abstained from smoking. 10 cigg/d No inhalers    Pulmonary exam normal breath sounds clear to auscultation       Cardiovascular hypertension (141/88 preop, normally 110s SBP), Pt. on medications and Pt. on home beta blockers Normal cardiovascular exam+ Valvular Problems/Murmurs (mild MR) MR  Rhythm:Regular Rate:Normal  Echo 2021  1. Left ventricular ejection fraction, by estimation, is 65 to 70%. The  left ventricle has normal function. The left ventricle has no regional  wall motion abnormalities. There is mild concentric left ventricular  hypertrophy. Left ventricular diastolic  parameters are consistent with Grade I diastolic dysfunction (impaired  relaxation).   2. Right ventricular systolic function is normal. The right ventricular  size is normal.   3. The mitral valve is normal in structure. Mild mitral valve  regurgitation. No evidence of mitral stenosis.   4. The aortic valve is normal in structure. Aortic valve regurgitation is  not visualized. No aortic stenosis is present.   5. The inferior vena cava is normal in size with greater than 50%  respiratory variability, suggesting right atrial pressure of 3 mmHg.   Myocardial Perfusion 09/28/2020  The left ventricular ejection fraction is hyperdynamic (>65%).  Nuclear stress EF: 67%.  No T wave inversion was noted during stress.  There was no ST segment deviation noted during stress.  Defect 1: There is a small defect of mild severity.  This is a low risk study.   Small  size, mild intensity apical perfusion defect, improved with stress upright imaging, suggestive of attenuation artifact. No significant reversible ischemia. LVEF 67% with normal wall motion. This is a low risk study.      Neuro/Psych  PSYCHIATRIC DISORDERS Anxiety     negative neurological ROS     GI/Hepatic negative GI ROS, Neg liver ROS,,,  Endo/Other  diabetes, Well Controlled, Type 2, Insulin  Dependent, Oral Hypoglycemic AgentsHypothyroidism  BMI 42  Renal/GU negative Renal ROS  negative genitourinary   Musculoskeletal negative musculoskeletal ROS (+)    Abdominal  (+) + obese  Peds  Hematology negative hematology ROS (+)   Anesthesia Other Findings Ozempic  LD: 3weeks  Reproductive/Obstetrics negative OB ROS                              Anesthesia Physical Anesthesia Plan  ASA: 3  Anesthesia Plan: General   Post-op Pain Management: Tylenol  PO (pre-op)*   Induction: Intravenous  PONV Risk Score and Plan: 3 and Ondansetron , Dexamethasone , Midazolam  and Treatment may vary due to age or medical condition  Airway Management Planned: Oral ETT  Additional Equipment: None  Intra-op Plan:   Post-operative Plan: Extubation in OR  Informed Consent: I have reviewed the patients History and Physical, chart, labs and discussed the procedure including the risks, benefits and alternatives for the proposed anesthesia with the patient or authorized representative who has indicated his/her understanding and acceptance.     Dental advisory given  Plan Discussed with: CRNA  Anesthesia Plan Comments: (Last airway note: Ventilation: Mask ventilation without difficulty Laryngoscope  Size: Mac and 4 Grade View: Grade II Tube type: Oral Tube size: 7.5 mm Number of attempts: 1 )         Anesthesia Quick Evaluation

## 2024-09-11 NOTE — Progress Notes (Signed)
 Anesthesia Chart Review   Case: [id number] Date/Time: 09/14/24 1115   Procedure: PARATHYROIDECTOMY   Anesthesia type: General   Diagnosis:      Primary hyperparathyroidism (HCC) [E21.0]     Hyperthyroidism [E05.90]   Pre-op diagnosis: PRIMARY HYPERTHYROIDISM   [facility] ROOM [facility]   Surgeons: Eletha Boas, MD       DISCUSSION:63 y.o. smoker with h/o HTN, DM II, CAD, primary hyperthyroidism scheduled for above procedure 09/14/2024 with Dr. Boas Eletha.   Tracy Graves follows with cardiology due to HTN, hyperlipidemia, Aortic atherosclerosis, incomplete RBBB.  Last seen by cardio 01/10/2024.  Tracy Graves asymptomatic at this visit.  1 year follow up recommended.     Low risk stress test 2021.   Tracy Graves not seen in PAT clinic, SDW, labs DOS.  VS: Ht 5' 4.5 (1.638 m)   Wt 105.2 kg   BMI 39.21 kg/m   PROVIDERS: Muse, Rochelle D., PA-C is PCP  Cardiologist - Stanly DELENA Leavens, MD  LABS: labs DOS (all labs ordered are listed, but only abnormal results are displayed)  Labs Reviewed - No data to display   IMAGES:   EKG:   CV: Myocardial Perfusion 09/28/2020 The left ventricular ejection fraction is hyperdynamic (>65%). Nuclear stress EF: 67%. No T wave inversion was noted during stress. There was no ST segment deviation noted during stress. Defect 1: There is a small defect of mild severity. This is a low risk study.   Small size, mild intensity apical perfusion defect, improved with stress upright imaging, suggestive of attenuation artifact. No significant reversible ischemia. LVEF 67% with normal wall motion. This is a low risk study.  Echo 10/04/2020 1. Left ventricular ejection fraction, by estimation, is 65 to 70%. The  left ventricle has normal function. The left ventricle has no regional  wall motion abnormalities. There is mild concentric left ventricular  hypertrophy. Left ventricular diastolic  parameters are consistent with Grade I diastolic dysfunction (impaired   relaxation).   2. Right ventricular systolic function is normal. The right ventricular  size is normal.   3. The mitral valve is normal in structure. Mild mitral valve  regurgitation. No evidence of mitral stenosis.   4. The aortic valve is normal in structure. Aortic valve regurgitation is  not visualized. No aortic stenosis is present.   5. The inferior vena cava is normal in size with greater than 50%  respiratory variability, suggesting right atrial pressure of 3 mmHg.  Past Medical History:  Diagnosis Date   Abdominal hernia    Abscess    chest wall, right breast   Anxiety    Cellulitis    Coronary artery disease    Diabetes (HCC)    History of kidney stones    Hyperglycemia due to type 2 diabetes mellitus (HCC) 06/05/2015   Hypertension    Hypothyroid    2016   Incomplete RBBB    Intertrigo    Morbid obesity due to excess calories (HCC) 06/05/2015   Multiple food allergies    Shortness of breath    Tobacco use     Past Surgical History:  Procedure Laterality Date   CYSTOSCOPY/URETEROSCOPY/HOLMIUM LASER/STENT PLACEMENT Right 06/09/2024   Procedure: CYSTOSCOPY/URETEROSCOPY/HOLMIUM LASER/STENT PLACEMENT;  Surgeon: Devere Lonni Righter, MD;  Location: [facility];  Service: Urology;  Laterality: Right;  CYSTOSCOPY WITH RIGHT URETEROSCOPY, LASER LITHOTRIPSY, STENT PLACEMENT   IR GENERIC HISTORICAL  12/23/2016   IR NEPHROSTOMY PLACEMENT LEFT 12/23/2016 Ozell Specking, MD [facility]   NEPHROLITHOTOMY Left 01/28/2017  Procedure: LEFT NEPHROLITHOTOMY PERCUTANEOUS;  Surgeon: Morene LELON Salines, MD;  Location: [facility];  Service: Urology;  Laterality: Left;   NEPHROSTOMY TUBE PLACEMENT ([facility] HX)  12/23/2016    MEDICATIONS: No current facility-administered medications for this encounter.    atorvastatin  (LIPITOR) 20 MG tablet   levothyroxine  (SYNTHROID ) 125 MCG tablet   metFORMIN  (GLUCOPHAGE ) 500 MG tablet   metoprolol succinate (TOPROL-XL) 50 MG 24 hr tablet   nystatin   (MYCOSTATIN /NYSTOP ) powder   nystatin  cream (MYCOSTATIN )   polyethylene glycol (MIRALAX  / GLYCOLAX ) 17 g packet   Semaglutide ,0.25 or 0.5MG /DOS, (OZEMPIC , 0.25 OR 0.5 MG/DOSE,) 2 MG/3ML SOPN   sertraline  (ZOLOFT ) 25 MG tablet   amoxicillin -clavulanate (AUGMENTIN ) 875-125 MG tablet   Insulin  Pen Needle (BD PEN NEEDLE NANO 2ND GEN) 32G X 4 MM MISC     [facility], PA-C [facility] Pre-Surgical Testing [phone]

## 2024-09-11 NOTE — H&P (Signed)
 REFERRING PHYSICIAN: Therisa Benton PARAS, NP  PROVIDER: Abelardo Seidner OZELL SPINNER, MD   Chief Complaint: New Consultation (Primary hyperparathyroidism)  History of Present Illness:  Patient is referred by Benton Therisa, NP, for surgical evaluation and management of newly diagnosed primary hyperparathyroidism. Patient was noted on routine laboratory studies to have persistently elevated serum calcium  levels. Levels have ranged from 9.9 to as high as 11.1. Subsequent PTH level was elevated at 89. Patient has multiple symptoms including fatigue, recurrent nephrolithiasis, urinary frequency, constipation, and bone loss on bone density scanning. Patient has had no prior head or neck surgery. There is a family history of thyroid  disease in her sister and her father but no family history of parathyroid  disease. Patient has not had any imaging studies performed. She presents today to discuss parathyroid  disease, further evaluation, and surgical management.  Review of Systems: A complete review of systems was obtained from the patient. I have reviewed this information and discussed as appropriate with the patient. See HPI as well for other ROS.  Review of Systems  Constitutional: Positive for malaise/fatigue.  HENT: Negative.  Eyes: Negative.  Respiratory: Negative.  Cardiovascular: Negative.  Gastrointestinal: Positive for constipation.  Genitourinary: Positive for frequency.  Musculoskeletal: Negative.  Skin: Negative.  Neurological: Negative.  Endo/Heme/Allergies: Negative.  Psychiatric/Behavioral: Negative.    Medical History: Past Medical History:  Diagnosis Date  Diabetes mellitus without complication (CMS/HHS-HCC)  Hyperlipidemia  Hypertension  Thyroid  disease   Patient Active Problem List  Diagnosis  Primary hyperparathyroidism (CMS/HHS-HCC)   Past Surgical History:  Procedure Laterality Date  Left Nephrolithotomy Percutaneous 01/28/2017  Dr. Cam    No Known  Allergies  Current Outpatient Medications on File Prior to Visit  Medication Sig Dispense Refill  albuterol 90 mcg/actuation inhaler INHALE 2 PUFFS BY MOUTH EVERY 4 TO 6 HOURS AS NEEDED WHEEZING AND/OR SHORTNESS OF BREATH  atorvastatin  (LIPITOR) 10 MG tablet Take 1 tablet by mouth once daily  ergocalciferol , vitamin D2, 1,250 mcg (50,000 unit) capsule Take 50,000 Units by mouth every 7 (seven) days  levothyroxine  (SYNTHROID ) 125 MCG tablet Take by mouth  metFORMIN  (GLUCOPHAGE ) 500 MG tablet Take 500 mg by mouth daily with breakfast  sertraline  (ZOLOFT ) 25 MG tablet Take 25 mg by mouth once daily   No current facility-administered medications on file prior to visit.   Family History  Problem Relation Age of Onset  Skin cancer Mother  Breast cancer Mother  Obesity Father  High blood pressure (Hypertension) Father  Coronary Artery Disease (Blocked arteries around heart) Father  Diabetes Father    Social History   Tobacco Use  Smoking Status Some Days  Current packs/day: 0.50  Types: Cigarettes  Smokeless Tobacco Never    Social History   Socioeconomic History  Marital status: Widowed  Tobacco Use  Smoking status: Some Days  Current packs/day: 0.50  Types: Cigarettes  Smokeless tobacco: Never  Substance and Sexual Activity  Alcohol use: Not Currently  Drug use: Never     Physical Exam   GENERAL APPEARANCE Comfortable, no acute issues Development: normal Gross deformities: none  SKIN Rash, lesions, ulcers: none Induration, erythema: none Nodules: none palpable  EYES Conjunctiva and lids: normal Pupils: equal  EARS, NOSE, MOUTH, THROAT External ears: no lesion or deformity External nose: no lesion or deformity Hearing: grossly normal  NECK Symmetric: yes Trachea: midline Thyroid : no palpable nodules in the thyroid  bed  CHEST/CV Not assessed  ABDOMEN Not assessed  GENITOURINARY/RECTAL Not assessed  MUSCULOSKELETAL Station and gait:  normal  Digits and nails: no clubbing or cyanosis Muscle strength: grossly normal all extremities Deformity: none  LYMPHATIC Cervical: none palpable Supraclavicular: none palpable  PSYCHIATRIC Oriented to person, place, and time: yes Mood and affect: normal for situation Judgment and insight: appropriate for situation   Assessment and Plan:   Primary hyperparathyroidism (CMS/HHS-HCC)  Patient is referred by her endocrinologist, Benton Rio, NP, for surgical evaluation and management of newly diagnosed primary hyperparathyroidism.  Patient provided with a copy of Parathyroid  Surgery: Treatment for Your Parathyroid  Gland Problem, published by [employer], 12 pages. Book reviewed and explained to patient during visit today.  Today we reviewed her clinical history. We reviewed recent laboratory studies. Would like to proceed with imaging studies to include an ultrasound examination of the neck as well as a nuclear medicine parathyroid  scan with sestamibi. We discussed the studies. They are successful and confirming the diagnosis and localizing the adenoma approximately 80% of the time. If they fail to identify the adenoma, then we will obtain a 4D CT scan of the neck. Hopefully we can confirm the diagnosis and identify the location of the adenoma prior to surgery. We discussed the fact that 95% of the time this is single gland disease. We discussed minimally invasive parathyroid  surgery. We discussed the size and location of the surgical incision. We discussed risk and benefits of surgery including the risk of recurrent laryngeal nerve injury. We discussed doing this as an outpatient surgical procedure.  The patient understands and wishes to proceed with imaging. We will make arrangements for ultrasound and sestamibi scans in the near future. I will contact the patient with the results when they are available and we will make plans for further management at that time.   Krystal Spinner,  MD [facility] practice Office: [phone]

## 2024-09-14 ENCOUNTER — Other Ambulatory Visit: Payer: Self-pay

## 2024-09-14 ENCOUNTER — Encounter (HOSPITAL_COMMUNITY)
Admission: RE | Disposition: A | Discharge status: Home / Self Care | Payer: Self-pay | Source: Home / Self Care | Attending: Surgery

## 2024-09-14 ENCOUNTER — Ambulatory Visit (HOSPITAL_COMMUNITY): Payer: Self-pay | Admitting: Physician Assistant

## 2024-09-14 ENCOUNTER — Ambulatory Visit (HOSPITAL_COMMUNITY)
Admission: RE | Admit: 2024-09-14 | Discharge: 2024-09-14 | Disposition: A | Discharge status: Home / Self Care | Attending: Surgery | Admitting: Surgery

## 2024-09-14 ENCOUNTER — Ambulatory Visit (HOSPITAL_BASED_OUTPATIENT_CLINIC_OR_DEPARTMENT_OTHER): Payer: Self-pay | Admitting: Physician Assistant

## 2024-09-14 ENCOUNTER — Encounter (HOSPITAL_COMMUNITY): Payer: Self-pay | Admitting: Surgery

## 2024-09-14 DIAGNOSIS — Z6841 Body Mass Index (BMI) 40.0 and over, adult: Secondary | ICD-10-CM | POA: Diagnosis not present

## 2024-09-14 DIAGNOSIS — E1169 Type 2 diabetes mellitus with other specified complication: Secondary | ICD-10-CM

## 2024-09-14 DIAGNOSIS — D351 Benign neoplasm of parathyroid gland: Secondary | ICD-10-CM | POA: Insufficient documentation

## 2024-09-14 DIAGNOSIS — E119 Type 2 diabetes mellitus without complications: Secondary | ICD-10-CM | POA: Insufficient documentation

## 2024-09-14 DIAGNOSIS — G4733 Obstructive sleep apnea (adult) (pediatric): Secondary | ICD-10-CM

## 2024-09-14 DIAGNOSIS — Z794 Long term (current) use of insulin: Secondary | ICD-10-CM | POA: Insufficient documentation

## 2024-09-14 DIAGNOSIS — E210 Primary hyperparathyroidism: Secondary | ICD-10-CM

## 2024-09-14 DIAGNOSIS — F419 Anxiety disorder, unspecified: Secondary | ICD-10-CM | POA: Diagnosis not present

## 2024-09-14 DIAGNOSIS — F17210 Nicotine dependence, cigarettes, uncomplicated: Secondary | ICD-10-CM | POA: Insufficient documentation

## 2024-09-14 DIAGNOSIS — Z79899 Other long term (current) drug therapy: Secondary | ICD-10-CM | POA: Insufficient documentation

## 2024-09-14 DIAGNOSIS — Z7984 Long term (current) use of oral hypoglycemic drugs: Secondary | ICD-10-CM | POA: Diagnosis not present

## 2024-09-14 DIAGNOSIS — G4730 Sleep apnea, unspecified: Secondary | ICD-10-CM | POA: Insufficient documentation

## 2024-09-14 DIAGNOSIS — E669 Obesity, unspecified: Secondary | ICD-10-CM | POA: Insufficient documentation

## 2024-09-14 DIAGNOSIS — Z01818 Encounter for other preprocedural examination: Secondary | ICD-10-CM

## 2024-09-14 DIAGNOSIS — E0590 Thyrotoxicosis, unspecified without thyrotoxic crisis or storm: Secondary | ICD-10-CM | POA: Diagnosis present

## 2024-09-14 DIAGNOSIS — I10 Essential (primary) hypertension: Secondary | ICD-10-CM | POA: Diagnosis not present

## 2024-09-14 HISTORY — DX: Anxiety disorder, unspecified: F41.9

## 2024-09-14 HISTORY — PX: PARATHYROIDECTOMY: SHX19

## 2024-09-14 LAB — BASIC METABOLIC PANEL WITH GFR
Anion gap: 11 (ref 5–15)
BUN: 13 mg/dL (ref 8–23)
CO2: 23 mmol/L (ref 22–32)
Calcium: 10.8 mg/dL — ABNORMAL HIGH (ref 8.9–10.3)
Chloride: 103 mmol/L (ref 98–111)
Creatinine, Ser: 0.77 mg/dL (ref 0.44–1.00)
GFR, Estimated: >60 mL/min (ref >60)
Glucose, Bld: 104 mg/dL — ABNORMAL HIGH (ref 70–99)
Potassium: 4.3 mmol/L (ref 3.5–5.1)
Sodium: 136 mmol/L (ref 135–145)

## 2024-09-14 LAB — CBC
HCT: 48.5 % — ABNORMAL HIGH (ref 36.0–46.0)
Hemoglobin: 16.4 g/dL — ABNORMAL HIGH (ref 12.0–15.0)
MCH: 31.7 pg (ref 26.0–34.0)
MCHC: 33.8 g/dL (ref 30.0–36.0)
MCV: 93.8 fL (ref 80.0–100.0)
Platelets: 417 K/uL — ABNORMAL HIGH (ref 150–400)
RBC: 5.17 MIL/uL — ABNORMAL HIGH (ref 3.87–5.11)
RDW: 13.4 % (ref 11.5–15.5)
WBC: 8.4 K/uL (ref 4.0–10.5)
nRBC: 0 % (ref 0.0–0.2)

## 2024-09-14 LAB — HEMOGLOBIN A1C
Hgb A1c MFr Bld: 4.9 % (ref 4.8–5.6)
Mean Plasma Glucose: 93.93 mg/dL

## 2024-09-14 LAB — GLUCOSE, CAPILLARY
Glucose-Capillary: 104 mg/dL — ABNORMAL HIGH (ref 70–99)
Glucose-Capillary: 124 mg/dL — ABNORMAL HIGH (ref 70–99)

## 2024-09-14 SURGERY — PARATHYROIDECTOMY
Anesthesia: General | Site: Neck

## 2024-09-14 MED ORDER — BUPIVACAINE HCL 0.25 % IJ SOLN
INTRAMUSCULAR | Status: DC | PRN
  Administered 2024-09-14: 10 mL

## 2024-09-14 MED ORDER — ORAL CARE MOUTH RINSE: 15.0000 mL | Freq: Once | OROMUCOSAL | Status: AC

## 2024-09-14 MED ORDER — PROPOFOL 10 MG/ML IV BOLUS
INTRAVENOUS | Status: DC | PRN
  Administered 2024-09-14: 150 mg via INTRAVENOUS
  Administered 2024-09-14: 30 mg via INTRAVENOUS

## 2024-09-14 MED ORDER — MIDAZOLAM HCL 2 MG/2ML IJ SOLN
INTRAMUSCULAR | Status: AC
  Filled 2024-09-14: qty 2

## 2024-09-14 MED ORDER — SUGAMMADEX SODIUM 200 MG/2ML IV SOLN
INTRAVENOUS | Status: DC | PRN
  Administered 2024-09-14: 200 mg via INTRAVENOUS

## 2024-09-14 MED ORDER — BUPIVACAINE HCL (PF) 0.25 % IJ SOLN
INTRAMUSCULAR | Status: AC
Start: 2024-09-14 — End: 2024-09-14
  Filled 2024-09-14: qty 30

## 2024-09-14 MED ORDER — LIDOCAINE HCL (PF) 2 % IJ SOLN
INTRAMUSCULAR | Status: DC | PRN
  Administered 2024-09-14: 100 mg via INTRADERMAL

## 2024-09-14 MED ORDER — CHLORHEXIDINE GLUCONATE CLOTH 2 % EX PADS
6.0000 | MEDICATED_PAD | Freq: Once | CUTANEOUS | Status: DC
Start: 2024-09-14 — End: 2024-09-14

## 2024-09-14 MED ORDER — 0.9 % SODIUM CHLORIDE (POUR BTL) OPTIME
TOPICAL | Status: DC | PRN
  Administered 2024-09-14: 1000 mL

## 2024-09-14 MED ORDER — ROCURONIUM BROMIDE 10 MG/ML (PF) SYRINGE
PREFILLED_SYRINGE | INTRAVENOUS | Status: AC
  Filled 2024-09-14: qty 10

## 2024-09-14 MED ORDER — SUGAMMADEX SODIUM 200 MG/2ML IV SOLN
INTRAVENOUS | Status: AC
  Filled 2024-09-14: qty 2

## 2024-09-14 MED ORDER — OXYCODONE HCL 5 MG PO TABS
5.0000 mg | ORAL_TABLET | Freq: Once | ORAL | Status: AC | PRN
  Administered 2024-09-14: 5 mg via ORAL

## 2024-09-14 MED ORDER — HEMOSTATIC AGENTS (NO CHARGE) OPTIME
TOPICAL | Status: DC | PRN
  Administered 2024-09-14: 1 via TOPICAL

## 2024-09-14 MED ORDER — OXYCODONE HCL 5 MG PO TABS
ORAL_TABLET | ORAL | Status: AC
  Filled 2024-09-14: qty 1

## 2024-09-14 MED ORDER — CHLORHEXIDINE GLUCONATE CLOTH 2 % EX PADS: 6.0000 | MEDICATED_PAD | Freq: Once | CUTANEOUS | Status: DC

## 2024-09-14 MED ORDER — ONDANSETRON HCL 4 MG/2ML IJ SOLN: 4.0000 mg | Freq: Once | INTRAMUSCULAR | Status: DC | PRN

## 2024-09-14 MED ORDER — FENTANYL CITRATE (PF) 100 MCG/2ML IJ SOLN
INTRAMUSCULAR | Status: DC | PRN
  Administered 2024-09-14: 100 ug via INTRAVENOUS

## 2024-09-14 MED ORDER — ROCURONIUM BROMIDE 10 MG/ML (PF) SYRINGE
PREFILLED_SYRINGE | INTRAVENOUS | Status: DC | PRN
  Administered 2024-09-14: 20 mg via INTRAVENOUS
  Administered 2024-09-14: 60 mg via INTRAVENOUS

## 2024-09-14 MED ORDER — AMISULPRIDE (ANTIEMETIC) 5 MG/2ML IV SOLN: 10.0000 mg | Freq: Once | INTRAVENOUS | Status: DC | PRN

## 2024-09-14 MED ORDER — INSULIN ASPART 100 UNIT/ML IJ SOLN: 0.0000 [IU] | INTRAMUSCULAR | Status: DC | PRN

## 2024-09-14 MED ORDER — LACTATED RINGERS IV SOLN: INTRAVENOUS | Status: DC

## 2024-09-14 MED ORDER — HYDROMORPHONE HCL 1 MG/ML IJ SOLN: 0.2500 mg | INTRAMUSCULAR | Status: DC | PRN

## 2024-09-14 MED ORDER — ACETAMINOPHEN 500 MG PO TABS
1000.0000 mg | ORAL_TABLET | Freq: Once | ORAL | Status: AC
Start: 2024-09-14 — End: 2024-09-14
  Administered 2024-09-14: 1000 mg via ORAL
  Filled 2024-09-14: qty 2

## 2024-09-14 MED ORDER — TRAMADOL HCL 50 MG PO TABS: 50.0000 mg | ORAL_TABLET | Freq: Four times a day (QID) | ORAL | 0 refills | Status: DC | PRN

## 2024-09-14 MED ORDER — EPHEDRINE 5 MG/ML INJ
INTRAVENOUS | Status: AC
Start: 2024-09-14 — End: 2024-09-14
  Filled 2024-09-14: qty 5

## 2024-09-14 MED ORDER — MIDAZOLAM HCL 5 MG/5ML IJ SOLN
INTRAMUSCULAR | Status: DC | PRN
  Administered 2024-09-14: 2 mg via INTRAVENOUS

## 2024-09-14 MED ORDER — LIDOCAINE HCL (PF) 2 % IJ SOLN
INTRAMUSCULAR | Status: AC
  Filled 2024-09-14: qty 5

## 2024-09-14 MED ORDER — CEFAZOLIN SODIUM-DEXTROSE 2-4 GM/100ML-% IV SOLN
2.0000 g | INTRAVENOUS | Status: AC
  Administered 2024-09-14: 2 g via INTRAVENOUS
  Filled 2024-09-14: qty 100

## 2024-09-14 MED ORDER — HYDROMORPHONE HCL 1 MG/ML IJ SOLN
INTRAMUSCULAR | Status: AC
  Filled 2024-09-14: qty 1

## 2024-09-14 MED ORDER — ONDANSETRON HCL 4 MG/2ML IJ SOLN
INTRAMUSCULAR | Status: AC
  Filled 2024-09-14: qty 2

## 2024-09-14 MED ORDER — FENTANYL CITRATE (PF) 100 MCG/2ML IJ SOLN
INTRAMUSCULAR | Status: AC
  Filled 2024-09-14: qty 2

## 2024-09-14 MED ORDER — DEXAMETHASONE SODIUM PHOSPHATE 10 MG/ML IJ SOLN
INTRAMUSCULAR | Status: AC
  Filled 2024-09-14: qty 1

## 2024-09-14 MED ORDER — DEXAMETHASONE SODIUM PHOSPHATE 10 MG/ML IJ SOLN
INTRAMUSCULAR | Status: DC | PRN
  Administered 2024-09-14: 10 mg via INTRAVENOUS

## 2024-09-14 MED ORDER — PROPOFOL 10 MG/ML IV BOLUS
INTRAVENOUS | Status: AC
  Filled 2024-09-14: qty 20

## 2024-09-14 MED ORDER — PHENYLEPHRINE 80 MCG/ML (10ML) SYRINGE FOR IV PUSH (FOR BLOOD PRESSURE SUPPORT)
PREFILLED_SYRINGE | INTRAVENOUS | Status: DC | PRN
  Administered 2024-09-14: 240 ug via INTRAVENOUS
  Administered 2024-09-14 (×2): 160 ug via INTRAVENOUS

## 2024-09-14 MED ORDER — EPHEDRINE SULFATE-NACL 50-0.9 MG/10ML-% IV SOSY
PREFILLED_SYRINGE | INTRAVENOUS | Status: DC | PRN
  Administered 2024-09-14 (×2): 5 mg via INTRAVENOUS

## 2024-09-14 MED ORDER — OXYCODONE HCL 5 MG/5ML PO SOLN: 5.0000 mg | Freq: Once | ORAL | Status: AC | PRN

## 2024-09-14 MED ORDER — PHENYLEPHRINE 80 MCG/ML (10ML) SYRINGE FOR IV PUSH (FOR BLOOD PRESSURE SUPPORT)
PREFILLED_SYRINGE | INTRAVENOUS | Status: AC
  Filled 2024-09-14: qty 10

## 2024-09-14 MED ORDER — ONDANSETRON HCL 4 MG/2ML IJ SOLN
INTRAMUSCULAR | Status: DC | PRN
  Administered 2024-09-14: 4 mg via INTRAVENOUS

## 2024-09-14 MED ORDER — CHLORHEXIDINE GLUCONATE 0.12 % MT SOLN
15.0000 mL | Freq: Once | OROMUCOSAL | Status: AC
  Administered 2024-09-14: 15 mL via OROMUCOSAL

## 2024-09-14 SURGICAL SUPPLY — 30 items
ATTRACTOMAT 16X20 MAGNETIC DRP (DRAPES) ×2 IMPLANT
BAG COUNTER SPONGE SURGICOUNT (BAG) ×2 IMPLANT
BLADE SURG 15 STRL LF DISP TIS (BLADE) ×2 IMPLANT
CHLORAPREP W/TINT 26 (MISCELLANEOUS) ×2 IMPLANT
CLIP TI MEDIUM 6 (CLIP) ×4 IMPLANT
CLIP TI WIDE RED SMALL 6 (CLIP) ×4 IMPLANT
COVER SURGICAL LIGHT HANDLE (MISCELLANEOUS) ×2 IMPLANT
DERMABOND ADVANCED .7 DNX12 (GAUZE/BANDAGES/DRESSINGS) ×2 IMPLANT
DRAPE LAPAROTOMY T 98X78 PEDS (DRAPES) ×2 IMPLANT
DRAPE UTILITY XL STRL (DRAPES) ×2 IMPLANT
ELECT PENCIL ROCKER SW 15FT (MISCELLANEOUS) ×2 IMPLANT
ELECT REM PT RETURN 15FT ADLT (MISCELLANEOUS) ×2 IMPLANT
GAUZE 4X4 16PLY ~~LOC~~+RFID DBL (SPONGE) ×2 IMPLANT
GLOVE SURG ORTHO 8.0 STRL STRW (GLOVE) ×2 IMPLANT
GOWN STRL REUS W/ TWL XL LVL3 (GOWN DISPOSABLE) ×6 IMPLANT
HEMOSTAT SURGICEL 2X4 FIBR (HEMOSTASIS) ×2 IMPLANT
ILLUMINATOR WAVEGUIDE N/F (MISCELLANEOUS) IMPLANT
KIT BASIN OR (CUSTOM PROCEDURE TRAY) ×2 IMPLANT
KIT TURNOVER KIT A (KITS) ×2 IMPLANT
MAT PREVALON FULL STRYKER (MISCELLANEOUS) IMPLANT
NDL HYPO 22X1.5 SAFETY MO (MISCELLANEOUS) ×2 IMPLANT
NEEDLE HYPO 22X1.5 SAFETY MO (MISCELLANEOUS) ×1 IMPLANT
PACK BASIC VI WITH GOWN DISP (CUSTOM PROCEDURE TRAY) ×2 IMPLANT
SHEARS HARMONIC 9CM CVD (BLADE) ×2 IMPLANT
SUT MNCRL AB 4-0 PS2 18 (SUTURE) ×2 IMPLANT
SUT VIC AB 3-0 SH 18 (SUTURE) ×2 IMPLANT
SYR BULB IRRIG 60ML STRL (SYRINGE) ×2 IMPLANT
SYR CONTROL 10ML LL (SYRINGE) ×2 IMPLANT
TOWEL OR 17X26 10 PK STRL BLUE (TOWEL DISPOSABLE) ×2 IMPLANT
TUBING CONNECTING 10 (TUBING) ×2 IMPLANT

## 2024-09-14 NOTE — Anesthesia Procedure Notes (Signed)
 Procedure Name: Intubation Date/Time: 09/14/2024 10:47 AM  Performed by: Carleton Garnette SAUNDERS, CRNAPre-anesthesia Checklist: Patient identified, Emergency Drugs available, Suction available, Patient being monitored and Timeout performed Patient Re-evaluated:Patient Re-evaluated prior to induction Oxygen Delivery Method: Circle system utilized Preoxygenation: Pre-oxygenation with 100% oxygen Induction Type: IV induction Ventilation: Mask ventilation without difficulty Laryngoscope Size: Mac and 4 Grade View: Grade I Tube type: Oral Tube size: 7.0 mm Number of attempts: 1 Airway Equipment and Method: Stylet Placement Confirmation: ETT inserted through vocal cords under direct vision, positive ETCO2 and breath sounds checked- equal and bilateral Secured at: 22 cm Tube secured with: Tape Dental Injury: Teeth and Oropharynx as per pre-operative assessment

## 2024-09-14 NOTE — Anesthesia Postprocedure Evaluation (Signed)
 Anesthesia Post Note  Patient: Tracy Graves  Procedure(s) Performed: PARATHYROIDECTOMY (Neck)     Patient location during evaluation: PACU Anesthesia Type: General Level of consciousness: awake and alert Pain management: pain level controlled Vital Signs Assessment: post-procedure vital signs reviewed and stable Respiratory status: spontaneous breathing, nonlabored ventilation, respiratory function stable and patient connected to nasal cannula oxygen Cardiovascular status: blood pressure returned to baseline and stable Postop Assessment: no apparent nausea or vomiting Anesthetic complications: no   No notable events documented.  Last Vitals:  Vitals:   09/14/24 1217 09/14/24 1230  BP: 125/81 130/71  Pulse: 66 66  Resp: 20 20  Temp: 36.6 C   SpO2: 94% 92%    Last Pain:  Vitals:   09/14/24 1231  TempSrc:   PainSc: 4                  Garnette DELENA Gab

## 2024-09-14 NOTE — Discharge Instructions (Addendum)

## 2024-09-14 NOTE — Interval H&P Note (Signed)
 History and Physical Interval Note:  09/14/2024 10:11 AM  Tracy Graves  has presented today for surgery, with the diagnosis of PRIMARY HYPERTHYROIDISM.  The various methods of treatment have been discussed with the patient and family. After consideration of risks, benefits and other options for treatment, the patient has consented to    Procedure(s): PARATHYROIDECTOMY (N/A) as a surgical intervention.    The patient's history has been reviewed, patient examined, no change in status, stable for surgery.  I have reviewed the patient's chart and labs.  Questions were answered to the patient's satisfaction.    Krystal Spinner, MD [facility] practice Office: [phone]   Krystal Spinner

## 2024-09-14 NOTE — Transfer of Care (Signed)
 Immediate Anesthesia Transfer of Care Note  Patient: Tracy Graves  Procedure(s) Performed: PARATHYROIDECTOMY (Neck)  Patient Location: PACU  Anesthesia Type:General  Level of Consciousness: sedated  Airway & Oxygen Therapy: Patient Spontanous Breathing and Patient connected to face mask oxygen  Post-op Assessment: Report given to RN and Post -op Vital signs reviewed and stable  Post vital signs: Reviewed and stable  Last Vitals:  Vitals Value Taken Time  BP    Temp    Pulse 66 09/14/24 12:17  Resp    SpO2 94 % 09/14/24 12:17  Vitals shown include unfiled device data.  Last Pain:  Vitals:   09/14/24 0935  TempSrc:   PainSc: 0-No pain         Complications: No notable events documented.

## 2024-09-14 NOTE — Op Note (Signed)
 OPERATIVE REPORT - PARATHYROIDECTOMY  Preoperative diagnosis: Primary hyperparathyroidism  Postop diagnosis: Same  Procedure: Neck exploration, left superior parathyroidectomy, right superior parathyroidectomy  Surgeon:  Krystal Spinner, MD  Assistant:  Puja Maczis, PA-C  Anesthesia: General endotracheal  Estimated blood loss: Minimal  Preparation: ChloraPrep  Indications: Patient is referred by Benton Rio, NP, for surgical evaluation and management of newly diagnosed primary hyperparathyroidism. Patient was noted on routine laboratory studies to have persistently elevated serum calcium  levels. Levels have ranged from 9.9 to as high as 11.1. Subsequent PTH level was elevated at 89. Patient has multiple symptoms including fatigue, recurrent nephrolithiasis, urinary frequency, constipation, and bone loss on bone density scanning. Patient has had no prior head or neck surgery. There is a family history of thyroid  disease in her sister and her father but no family history of parathyroid  disease.   Procedure: The patient was prepared in the pre-operative holding area. The patient was brought to the operating room and placed in a supine position on the operating room table. Following administration of general anesthesia, the patient was positioned and then prepped and draped in the usual strict aseptic fashion. After ascertaining that an adequate level of anesthesia been achieved, a neck incision was made with a #15 blade. Dissection was carried through subcutaneous tissues and platysma. Hemostasis was obtained with the electrocautery. Skin flaps were developed circumferentially and a Weitlander retractor was placed for exposure.  Strap muscles were incised in the midline. Strap muscles were reflected laterally exposing the left thyroid  lobe. With gentle blunt dissection the thyroid  lobe was mobilized.  Dissection was carried posteriorly and an enlarged parathyroid  gland was identified posterior to  the superior pole of the left lobe adjacent to the esophagus. It was gently mobilized. Vascular structures were divided between small ligaclips. Care was taken to avoid the recurrent laryngeal nerve. The parathyroid  gland was completely excised. It was submitted to pathology where frozen section confirmed hypercellular parathyroid  tissue consistent with adenoma.  Next we mobilized the strap muscles off of the anterior surface of the right thyroid  lobe.  Dissection was carried laterally and posteriorly.  Again an enlarged parathyroid  gland was noted posterior to the superior pole of the right thyroid  lobe.  It was gently dissected out.  Vascular structures were divided between small ligaclips and the gland was excised.  It was submitted to pathology where frozen section confirmed hypercellular parathyroid  tissue consistent with adenoma.  Further exploration along the inferior pole of both the right and left thyroid  lobes failed to reveal any additional parathyroid  tissue.  Exploration bilaterally inferiorly along the esophagus failed to identify any additional parathyroid  tissue.  Neck was irrigated with warm saline and good hemostasis was noted. Fibrillar was placed in the operative field. Strap muscles were approximated in the midline with interrupted 3-0 Vicryl sutures. Platysma was closed with interrupted 3-0 Vicryl sutures. Marcaine  was infiltrated circumferentially. Skin was closed with a running 4-0 Monocryl subcuticular suture. Wound was washed and dried and Dermabond was applied. Patient was awakened from anesthesia and brought to the recovery room. The patient tolerated the procedure well.   Krystal Spinner, MD [facility] Surgery Office: [phone]

## 2024-09-15 ENCOUNTER — Encounter (HOSPITAL_COMMUNITY): Payer: Self-pay | Admitting: Surgery

## 2024-09-16 LAB — SURGICAL PATHOLOGY

## 2024-09-21 ENCOUNTER — Ambulatory Visit (INDEPENDENT_AMBULATORY_CARE_PROVIDER_SITE_OTHER): Admitting: Family Medicine

## 2024-09-21 DIAGNOSIS — Z7985 Long-term (current) use of injectable non-insulin antidiabetic drugs: Secondary | ICD-10-CM

## 2024-09-21 DIAGNOSIS — Z6841 Body Mass Index (BMI) 40.0 and over, adult: Secondary | ICD-10-CM

## 2024-09-21 DIAGNOSIS — E1165 Type 2 diabetes mellitus with hyperglycemia: Secondary | ICD-10-CM | POA: Diagnosis not present

## 2024-09-21 MED ORDER — OZEMPIC (0.25 OR 0.5 MG/DOSE) 2 MG/3ML ~~LOC~~ SOPN
0.2500 mg | PEN_INJECTOR | SUBCUTANEOUS | 0 refills | Status: DC
Start: 2024-09-21 — End: 2024-11-02

## 2024-09-21 NOTE — Progress Notes (Signed)
   SUBJECTIVE:  Chief Complaint: Obesity  Interim History: Since last appointment patient had her parathyroidectomy for parathyroid  adenoma.  She is feeling better already and recovery was not nearly as bad as she had thought it may be.  She is overall having much more energy.  No pain just soreness.  Still has some numbness on her throat.  She is eating mostly soft foods but she has no restrictions she is just keeping the food soft. Over the next month she is just recovering from her surgery.  She did go see the last [employer] movie.  Marcine is here to discuss her progress with her obesity treatment plan. She is on the keeping a food journal and adhering to recommended goals of 1200-1300 calories and 90 grams of protein and states she is following her eating plan approximately 40 % of the time. She states she is not exercising much.   OBJECTIVE: Visit Diagnoses: Problem List Items Addressed This Visit   None   Vitals Temp: 98.2 F (36.8 C) BP: 102/69 Pulse Rate: 65 SpO2: 95 %   Anthropometric Measurements Height: 5' 3 (1.6 m) Weight: 234 lb (106.1 kg) BMI (Calculated): 41.46 Weight at Last Visit: 233 lb Weight Lost Since Last Visit: 0 Weight Gained Since Last Visit: 1 Starting Weight: 249 lb Total Weight Loss (lbs): 15 lb (6.804 kg)   Body Composition  Body Fat %: 50.9 % Fat Mass (lbs): 119.4 lbs Muscle Mass (lbs): 109.4 lbs Total Body Water (lbs): 83.8 lbs Visceral Fat Rating : 17   Other Clinical Data Today's Visit #: 48 Starting Date: 03/12/20 Comments: 1200-1300/90     ASSESSMENT AND PLAN: Assessment & Plan Type 2 diabetes mellitus with hyperglycemia, without long-term current use of insulin  [facility]) Patient is ready to restart Ozempic  after her recent surgery.  Minimal soreness of her throat and patient is able to tolerate almost all p.o. as she was prior to surgery.  Needs a refill of Ozempic  today.  No GI side effects of Ozempic  previously.  Will  follow-up on patient's intake at next appointment after restarting. Morbid obesity (HCC)  BMI 40.0-44.9, adult (HCC)    Diet: Victorina is currently in the action stage of change. As such, her goal is to continue with weight loss efforts and has agreed to keeping a food journal and adhering to recommended goals of 1200-1300 calories and 90 or more grams of protein.   Exercise:  For substantial health benefits, adults should do at least 150 minutes (2 hours and 30 minutes) a week of moderate-intensity, or 75 minutes (1 hour and 15 minutes) a week of vigorous-intensity aerobic physical activity, or an equivalent combination of moderate- and vigorous-intensity aerobic activity. Aerobic activity should be performed in episodes of at least 10 minutes, and preferably, it should be spread throughout the week.  Behavior Modification:  We discussed the following Behavioral Modification Strategies today: increasing lean protein intake, decreasing simple carbohydrates, increasing vegetables, meal planning and cooking strategies, and planning for success.  Follow-up in 4 to 5 weeks.  She was informed of the importance of frequent follow up visits to maximize her success with intensive lifestyle modifications for her multiple health conditions.  Attestation Statements:   Reviewed by clinician on day of visit: allergies, medications, problem list, medical history, surgical history, family history, social history, and previous encounter notes.     [name], MD

## 2024-09-26 NOTE — Assessment & Plan Note (Signed)
 Patient is ready to restart Ozempic  after her recent surgery.  Minimal soreness of her throat and patient is able to tolerate almost all p.o. as she was prior to surgery.  Needs a refill of Ozempic  today.  No GI side effects of Ozempic  previously.  Will follow-up on patient's intake at next appointment after restarting.

## 2024-10-09 ENCOUNTER — Telehealth: Payer: Self-pay | Admitting: *Deleted

## 2024-10-09 NOTE — Progress Notes (Signed)
 Pt confirmed pcp was with [facility] health department

## 2024-10-23 DIAGNOSIS — E785 Hyperlipidemia, unspecified: Secondary | ICD-10-CM | POA: Diagnosis not present

## 2024-10-23 DIAGNOSIS — D75839 Thrombocytosis, unspecified: Secondary | ICD-10-CM | POA: Diagnosis not present

## 2024-10-23 DIAGNOSIS — D751 Secondary polycythemia: Secondary | ICD-10-CM | POA: Diagnosis not present

## 2024-10-23 DIAGNOSIS — E119 Type 2 diabetes mellitus without complications: Secondary | ICD-10-CM | POA: Diagnosis not present

## 2024-10-23 DIAGNOSIS — Z7985 Long-term (current) use of injectable non-insulin antidiabetic drugs: Secondary | ICD-10-CM | POA: Diagnosis not present

## 2024-10-23 DIAGNOSIS — I10 Essential (primary) hypertension: Secondary | ICD-10-CM | POA: Diagnosis not present

## 2024-10-23 DIAGNOSIS — D582 Other hemoglobinopathies: Secondary | ICD-10-CM | POA: Diagnosis not present

## 2024-10-23 DIAGNOSIS — D72829 Elevated white blood cell count, unspecified: Secondary | ICD-10-CM | POA: Diagnosis not present

## 2024-10-23 DIAGNOSIS — E039 Hypothyroidism, unspecified: Secondary | ICD-10-CM | POA: Diagnosis not present

## 2024-10-23 DIAGNOSIS — Z7984 Long term (current) use of oral hypoglycemic drugs: Secondary | ICD-10-CM | POA: Diagnosis not present

## 2024-10-23 DIAGNOSIS — B372 Candidiasis of skin and nail: Secondary | ICD-10-CM | POA: Diagnosis not present

## 2024-11-02 ENCOUNTER — Ambulatory Visit (INDEPENDENT_AMBULATORY_CARE_PROVIDER_SITE_OTHER): Admitting: Family Medicine

## 2024-11-02 ENCOUNTER — Encounter (INDEPENDENT_AMBULATORY_CARE_PROVIDER_SITE_OTHER): Payer: Self-pay | Admitting: Family Medicine

## 2024-11-02 VITALS — BP 103/71 | HR 78 | Temp 98.2°F | Ht 63.0 in | Wt 238.0 lb

## 2024-11-02 DIAGNOSIS — R4589 Other symptoms and signs involving emotional state: Secondary | ICD-10-CM

## 2024-11-02 DIAGNOSIS — Z7985 Long-term (current) use of injectable non-insulin antidiabetic drugs: Secondary | ICD-10-CM

## 2024-11-02 DIAGNOSIS — E1165 Type 2 diabetes mellitus with hyperglycemia: Secondary | ICD-10-CM

## 2024-11-02 DIAGNOSIS — Z6841 Body Mass Index (BMI) 40.0 and over, adult: Secondary | ICD-10-CM

## 2024-11-02 DIAGNOSIS — Z720 Tobacco use: Secondary | ICD-10-CM | POA: Diagnosis not present

## 2024-11-02 MED ORDER — SERTRALINE HCL 25 MG PO TABS
25.0000 mg | ORAL_TABLET | Freq: Every day | ORAL | 0 refills | Status: AC
Start: 2024-11-02 — End: ?

## 2024-11-02 MED ORDER — OZEMPIC (0.25 OR 0.5 MG/DOSE) 2 MG/3ML ~~LOC~~ SOPN
0.5000 mg | PEN_INJECTOR | SUBCUTANEOUS | 0 refills | Status: AC
Start: 2024-11-02 — End: ?

## 2024-11-02 NOTE — Assessment & Plan Note (Addendum)
 Last cigarette was days before he parathyroidectomy!!!! She is feeling better overall and is not experiencing the cravings she is expecting.  Encouragement provided today

## 2024-11-02 NOTE — Progress Notes (Signed)
 SUBJECTIVE:  Chief Complaint: Obesity  Interim History: Patient is feeling good overall and was released from her surgeon after her parathyroidectomy.  She was given things to look out for and is feeling overall better.  For Halloween she read.  For Thanksgiving she is planning to hang out with her daughter and her daughters significant other.  She will likely watch movies and may do a variety of meat and cheeses instead of traditional Thanksgiving food.  She is able to get all food in comfortably.    Tracy Graves is here to discuss her progress with her obesity treatment plan. She is on the keeping a food journal and adhering to recommended goals of 1200-1300 calories and 90 grams of protein and states she is following her eating plan approximately 100 % of the time. She states she is exercising 30-45 minutes 5-6 times for 2 weeks.   OBJECTIVE: Visit Diagnoses: Problem List Items Addressed This Visit       Endocrine   Diabetes mellitus (HCC)   Relevant Medications   Semaglutide ,0.25 or 0.5MG /DOS, (OZEMPIC , 0.25 OR 0.5 MG/DOSE,) 2 MG/3ML SOPN     Other   Morbid obesity (HCC)   Relevant Medications   Semaglutide ,0.25 or 0.5MG /DOS, (OZEMPIC , 0.25 OR 0.5 MG/DOSE,) 2 MG/3ML SOPN   Tobacco abuse - Primary   Other Visit Diagnoses       BMI 40.0-44.9, adult (HCC)       Relevant Medications   Semaglutide ,0.25 or 0.5MG /DOS, (OZEMPIC , 0.25 OR 0.5 MG/DOSE,) 2 MG/3ML SOPN     Anxiety about health       Relevant Medications   sertraline  (ZOLOFT ) 25 MG tablet       Vitals Temp: 98.2 F (36.8 C) BP: 103/71 Pulse Rate: 78 SpO2: 98 %   Anthropometric Measurements Height: 5' 3 (1.6 m) Weight: 238 lb (108 kg) BMI (Calculated): 42.17 Weight at Last Visit: 234 lb Weight Lost Since Last Visit: 0 Weight Gained Since Last Visit: 4 Starting Weight: 249 lb Total Weight Loss (lbs): 11 lb (4.99 kg)   Body Composition  Body Fat %: 51.4 % Fat Mass (lbs): 122.6 lbs Muscle Mass (lbs): 110  lbs Total Body Water (lbs): 84.4 lbs Visceral Fat Rating : 17   Other Clinical Data Today's Visit #: 34 Starting Date: 03/12/20 Comments: 1200-1300/90     ASSESSMENT AND PLAN: Assessment & Plan Tobacco abuse Last cigarette was days before he parathyroidectomy!!!! She is feeling better overall and is not experiencing the cravings she is expecting.  Encouragement provided today Morbid obesity (HCC)  BMI 40.0-44.9, adult (HCC)  Type 2 diabetes mellitus with hyperglycemia, without long-term current use of insulin  (HCC) Doing well on semaglutide  with minimal if any side effects.  She is still working on intake and needs a refill of ozempic  today.  Continue current dose. Anxiety about health Overall feeling significantly better after parathyroidectomy and on sertraline .  Refill sertraline  today.  Continue current dose   Diet: Tracy Graves is currently in the action stage of change. As such, her goal is to continue with weight loss efforts and has agreed to keeping a food journal and adhering to recommended goals of 1200-1300 calories and 90 or more grams of protein.   Exercise:  For substantial health benefits, adults should do at least 150 minutes (2 hours and 30 minutes) a week of moderate-intensity, or 75 minutes (1 hour and 15 minutes) a week of vigorous-intensity aerobic physical activity, or an equivalent combination of moderate- and vigorous-intensity aerobic activity. Aerobic activity should  be performed in episodes of at least 10 minutes, and preferably, it should be spread throughout the week.  Behavior Modification:  We discussed the following Behavioral Modification Strategies today: increasing lean protein intake, decreasing simple carbohydrates, increasing vegetables, meal planning and cooking strategies, and planning for success.   Return in about 5 weeks (around 12/07/2024).   She was informed of the importance of frequent follow up visits to maximize her success with  intensive lifestyle modifications for her multiple health conditions.  Attestation Statements:   Reviewed by clinician on day of visit: allergies, medications, problem list, medical history, surgical history, family history, social history, and previous encounter notes.    [name], MD

## 2024-11-08 NOTE — Assessment & Plan Note (Signed)
 Doing well on semaglutide  with minimal if any side effects.  She is still working on intake and needs a refill of ozempic  today.  Continue current dose.

## 2024-12-07 DIAGNOSIS — E039 Hypothyroidism, unspecified: Secondary | ICD-10-CM | POA: Diagnosis not present

## 2024-12-14 ENCOUNTER — Ambulatory Visit (INDEPENDENT_AMBULATORY_CARE_PROVIDER_SITE_OTHER): Admitting: Family Medicine

## 2024-12-14 ENCOUNTER — Encounter (INDEPENDENT_AMBULATORY_CARE_PROVIDER_SITE_OTHER): Payer: Self-pay | Admitting: Family Medicine

## 2024-12-14 VITALS — BP 108/73 | HR 78 | Temp 98.0°F | Ht 63.0 in | Wt 240.0 lb

## 2024-12-14 DIAGNOSIS — Z7985 Long-term (current) use of injectable non-insulin antidiabetic drugs: Secondary | ICD-10-CM

## 2024-12-14 DIAGNOSIS — Z6841 Body Mass Index (BMI) 40.0 and over, adult: Secondary | ICD-10-CM | POA: Diagnosis not present

## 2024-12-14 DIAGNOSIS — Z720 Tobacco use: Secondary | ICD-10-CM

## 2024-12-14 DIAGNOSIS — E1165 Type 2 diabetes mellitus with hyperglycemia: Secondary | ICD-10-CM | POA: Diagnosis not present

## 2024-12-14 DIAGNOSIS — R4589 Other symptoms and signs involving emotional state: Secondary | ICD-10-CM | POA: Diagnosis not present

## 2024-12-14 MED ORDER — SERTRALINE HCL 25 MG PO TABS: 25.0000 mg | ORAL_TABLET | Freq: Every day | ORAL | 0 refills | Status: AC

## 2024-12-14 MED ORDER — OZEMPIC (0.25 OR 0.5 MG/DOSE) 2 MG/3ML ~~LOC~~ SOPN: 0.5000 mg | PEN_INJECTOR | SUBCUTANEOUS | 0 refills | Status: AC

## 2024-12-14 NOTE — Assessment & Plan Note (Signed)
 Last cigarette was 3 months ago.  She mentions that she overall feels much better and is no longer having any residual effects of stopping smoking. CONGRATULATIONS!

## 2024-12-14 NOTE — Progress Notes (Signed)
 "  SUBJECTIVE:  Chief Complaint: Obesity  Interim History: Patient has had a good Thanksgivng and mentions she got to hang out and watch movies and drink tea.  She enjoy quiet and peace. She has been consistent with her intake and calories around the same as they were.  She hasn't been journaling per say but calculating them out throughout the day.  She is eating quite a bit of soups and chili.  Over the next few weeks she mentions she is not sure what she is doing but she celebrating her birthday with some friends. Christmas she will be spending time with a friend.   Tracy Graves is here to discuss her progress with her obesity treatment plan. She is on the keeping a food journal and adhering to recommended goals of 1200-1300 calories and 90 grams of protein and states she is following her eating plan approximately 100 % of the time. She states she is exercising 30-120 minutes 7 times per week- mixture of VR aerobics   OBJECTIVE: Visit Diagnoses: Problem List Items Addressed This Visit       Endocrine   Diabetes mellitus (HCC)   Relevant Medications   Semaglutide ,0.25 or 0.5MG /DOS, (OZEMPIC , 0.25 OR 0.5 MG/DOSE,) 2 MG/3ML SOPN     Other   Morbid obesity (HCC)   Relevant Medications   Semaglutide ,0.25 or 0.5MG /DOS, (OZEMPIC , 0.25 OR 0.5 MG/DOSE,) 2 MG/3ML SOPN   Tobacco abuse - Primary   Other Visit Diagnoses       Anxiety about health       Relevant Medications   sertraline  (ZOLOFT ) 25 MG tablet     BMI 40.0-44.9, adult (HCC)       Relevant Medications   Semaglutide ,0.25 or 0.5MG /DOS, (OZEMPIC , 0.25 OR 0.5 MG/DOSE,) 2 MG/3ML SOPN       Vitals Temp: 98 F (36.7 C) BP: 108/73 Pulse Rate: 78 SpO2: 95 %   Anthropometric Measurements Height: 5' 3 (1.6 m) Weight: 240 lb (108.9 kg) BMI (Calculated): 42.52 Weight at Last Visit: 238lb Weight Lost Since Last Visit: 0 Weight Gained Since Last Visit: 2 Starting Weight: 249 lb Total Weight Loss (lbs): 9 lb (4.082 kg)   Body  Composition  Body Fat %: 52.1 % Fat Mass (lbs): 125 lbs Muscle Mass (lbs): 109.2 lbs Total Body Water (lbs): 86 lbs Visceral Fat Rating : 18   Other Clinical Data Today's Visit #: 50 Starting Date: 03/12/20 Comments: 1200-1300/90     ASSESSMENT AND PLAN: Assessment & Plan Tobacco abuse Last cigarette was 3 months ago.  She mentions that she overall feels much better and is no longer having any residual effects of stopping smoking. CONGRATULATIONS! Type 2 diabetes mellitus with hyperglycemia, without long-term current use of insulin  (HCC) On Ozempic  0.5 mg with no GI side effects.  Refill Ozempic  today and assess dietary intake at next appointment. Anxiety about health Patient's anxiety about her health has improved since her parathyroidectomy.Needs a refill on her Zoloft  today.  Will not increase doses as patient symptoms well-managed at current dose. BMI 40.0-44.9, adult [facility])  Morbid obesity (HCC)    Diet: Marlita is currently in the action stage of change. As such, her goal is to continue with weight loss efforts and has agreed to keeping a food journal and adhering to recommended goals of 1200-1300 calories and 90 or more grams of protein.   Exercise:  All adults should avoid inactivity. Some activity is better than none, and adults who participate in any amount of physical activity, gain some  health benefits.  Behavior Modification:  We discussed the following Behavioral Modification Strategies today: increasing lean protein intake, decreasing simple carbohydrates, meal planning and cooking strategies, holiday eating strategies, and keep a strict food journal.   Return in about 6 weeks (around 01/25/2025).   She was informed of the importance of frequent follow up visits to maximize her success with intensive lifestyle modifications for her multiple health conditions.  Attestation Statements:   Reviewed by clinician on day of visit: allergies, medications, problem list,  medical history, surgical history, family history, social history, and previous encounter notes.   [name], MD "

## 2024-12-19 ENCOUNTER — Encounter

## 2024-12-27 NOTE — Assessment & Plan Note (Signed)
 On Ozempic  0.5 mg with no GI side effects.  Refill Ozempic  today and assess dietary intake at next appointment.

## 2025-01-01 ENCOUNTER — Other Ambulatory Visit: Payer: Self-pay | Admitting: Nurse Practitioner

## 2025-01-12 ENCOUNTER — Other Ambulatory Visit (INDEPENDENT_AMBULATORY_CARE_PROVIDER_SITE_OTHER): Payer: Self-pay | Admitting: Family Medicine

## 2025-01-12 DIAGNOSIS — E1165 Type 2 diabetes mellitus with hyperglycemia: Secondary | ICD-10-CM

## 2025-01-25 ENCOUNTER — Encounter (INDEPENDENT_AMBULATORY_CARE_PROVIDER_SITE_OTHER): Payer: Self-pay

## 2025-01-25 ENCOUNTER — Ambulatory Visit (INDEPENDENT_AMBULATORY_CARE_PROVIDER_SITE_OTHER): Admitting: Family Medicine

## 2025-02-02 ENCOUNTER — Ambulatory Visit (INDEPENDENT_AMBULATORY_CARE_PROVIDER_SITE_OTHER): Admitting: Family Medicine

## 2025-02-09 ENCOUNTER — Ambulatory Visit (INDEPENDENT_AMBULATORY_CARE_PROVIDER_SITE_OTHER): Admitting: Family Medicine

## 2025-03-01 ENCOUNTER — Ambulatory Visit: Admitting: Internal Medicine
